# Patient Record
Sex: Female | Born: 1940 | Race: White | Hispanic: No | Smoking: Never smoker
Health system: Southern US, Community
[De-identification: ages and names within clinical notes are randomized; demographics above are authoritative.]

## PROBLEM LIST (undated history)

## (undated) DIAGNOSIS — D72829 Elevated white blood cell count, unspecified: Secondary | ICD-10-CM

## (undated) DIAGNOSIS — D751 Secondary polycythemia: Principal | ICD-10-CM

## (undated) DIAGNOSIS — C20 Malignant neoplasm of rectum: Secondary | ICD-10-CM

## (undated) DIAGNOSIS — Z923 Personal history of irradiation: Secondary | ICD-10-CM

## (undated) HISTORY — DX: Personal history of irradiation: Z92.3

## (undated) HISTORY — DX: Elevated white blood cell count, unspecified: D72.829

## (undated) HISTORY — PX: LAPAROSCOPIC SALPINGO OOPHERECTOMY: SHX5927

## (undated) HISTORY — PX: COLON RESECTION: SHX5231

## (undated) HISTORY — PX: SPLENECTOMY, TOTAL: SHX788

## (undated) HISTORY — DX: Malignant neoplasm of rectum: C20

## (undated) HISTORY — PX: CHOLECYSTECTOMY: SHX55

## (undated) HISTORY — DX: Secondary polycythemia: D75.1

---

## 2002-05-15 DIAGNOSIS — C20 Malignant neoplasm of rectum: Secondary | ICD-10-CM

## 2002-05-15 HISTORY — DX: Malignant neoplasm of rectum: C20

## 2002-08-02 LAB — HM COLONOSCOPY

## 2003-10-15 ENCOUNTER — Ambulatory Visit: Payer: Self-pay | Admitting: Internal Medicine

## 2003-11-15 ENCOUNTER — Ambulatory Visit: Payer: Self-pay | Admitting: Internal Medicine

## 2003-12-15 ENCOUNTER — Ambulatory Visit: Payer: Self-pay | Admitting: Internal Medicine

## 2004-01-15 ENCOUNTER — Ambulatory Visit: Payer: Self-pay | Admitting: Internal Medicine

## 2004-02-15 ENCOUNTER — Ambulatory Visit: Payer: Self-pay | Admitting: Internal Medicine

## 2004-03-14 ENCOUNTER — Ambulatory Visit: Payer: Self-pay | Admitting: Internal Medicine

## 2004-04-14 ENCOUNTER — Ambulatory Visit: Payer: Self-pay | Admitting: Internal Medicine

## 2004-05-14 ENCOUNTER — Ambulatory Visit: Payer: Self-pay | Admitting: Internal Medicine

## 2004-06-14 ENCOUNTER — Ambulatory Visit: Payer: Self-pay | Admitting: Internal Medicine

## 2004-06-26 ENCOUNTER — Emergency Department: Payer: Self-pay | Admitting: Emergency Medicine

## 2004-07-02 ENCOUNTER — Ambulatory Visit: Payer: Self-pay | Admitting: Family Medicine

## 2004-07-14 ENCOUNTER — Ambulatory Visit: Payer: Self-pay | Admitting: Internal Medicine

## 2004-08-14 ENCOUNTER — Ambulatory Visit: Payer: Self-pay | Admitting: Internal Medicine

## 2004-09-14 ENCOUNTER — Ambulatory Visit: Payer: Self-pay | Admitting: Internal Medicine

## 2004-10-14 ENCOUNTER — Ambulatory Visit: Payer: Self-pay | Admitting: Internal Medicine

## 2004-11-14 ENCOUNTER — Ambulatory Visit: Payer: Self-pay | Admitting: Internal Medicine

## 2004-12-14 ENCOUNTER — Ambulatory Visit: Payer: Self-pay | Admitting: Internal Medicine

## 2005-01-14 ENCOUNTER — Ambulatory Visit: Payer: Self-pay | Admitting: Internal Medicine

## 2005-02-14 ENCOUNTER — Ambulatory Visit: Payer: Self-pay | Admitting: Internal Medicine

## 2005-03-14 ENCOUNTER — Ambulatory Visit: Payer: Self-pay | Admitting: Internal Medicine

## 2005-04-14 ENCOUNTER — Ambulatory Visit: Payer: Self-pay | Admitting: Internal Medicine

## 2005-05-14 ENCOUNTER — Ambulatory Visit: Payer: Self-pay | Admitting: Internal Medicine

## 2005-06-14 ENCOUNTER — Ambulatory Visit: Payer: Self-pay | Admitting: Internal Medicine

## 2005-07-14 ENCOUNTER — Ambulatory Visit: Payer: Self-pay | Admitting: Internal Medicine

## 2005-08-14 ENCOUNTER — Ambulatory Visit: Payer: Self-pay | Admitting: Internal Medicine

## 2005-09-14 ENCOUNTER — Ambulatory Visit: Payer: Self-pay | Admitting: Internal Medicine

## 2005-10-14 ENCOUNTER — Ambulatory Visit: Payer: Self-pay | Admitting: Internal Medicine

## 2005-11-14 ENCOUNTER — Ambulatory Visit: Payer: Self-pay | Admitting: Internal Medicine

## 2005-12-14 ENCOUNTER — Ambulatory Visit: Payer: Self-pay | Admitting: Internal Medicine

## 2006-01-14 ENCOUNTER — Ambulatory Visit: Payer: Self-pay | Admitting: Internal Medicine

## 2006-02-14 ENCOUNTER — Ambulatory Visit: Payer: Self-pay | Admitting: Internal Medicine

## 2006-03-15 ENCOUNTER — Ambulatory Visit: Payer: Self-pay | Admitting: Internal Medicine

## 2006-04-15 ENCOUNTER — Ambulatory Visit: Payer: Self-pay | Admitting: Internal Medicine

## 2006-05-15 ENCOUNTER — Ambulatory Visit: Payer: Self-pay | Admitting: Internal Medicine

## 2006-06-15 ENCOUNTER — Ambulatory Visit: Payer: Self-pay | Admitting: Internal Medicine

## 2006-07-15 ENCOUNTER — Ambulatory Visit: Payer: Self-pay | Admitting: Internal Medicine

## 2006-08-15 ENCOUNTER — Ambulatory Visit: Payer: Self-pay | Admitting: Internal Medicine

## 2006-09-15 ENCOUNTER — Ambulatory Visit: Payer: Self-pay | Admitting: Internal Medicine

## 2006-10-15 ENCOUNTER — Ambulatory Visit: Payer: Self-pay | Admitting: Internal Medicine

## 2006-11-15 ENCOUNTER — Ambulatory Visit: Payer: Self-pay | Admitting: Internal Medicine

## 2006-12-15 ENCOUNTER — Ambulatory Visit: Payer: Self-pay | Admitting: Internal Medicine

## 2007-01-15 ENCOUNTER — Ambulatory Visit: Payer: Self-pay | Admitting: Internal Medicine

## 2007-02-15 ENCOUNTER — Ambulatory Visit: Payer: Self-pay | Admitting: Internal Medicine

## 2007-03-15 ENCOUNTER — Ambulatory Visit: Payer: Self-pay | Admitting: Internal Medicine

## 2007-04-15 ENCOUNTER — Ambulatory Visit: Payer: Self-pay | Admitting: Internal Medicine

## 2007-05-15 ENCOUNTER — Ambulatory Visit: Payer: Self-pay | Admitting: Internal Medicine

## 2007-06-15 ENCOUNTER — Ambulatory Visit: Payer: Self-pay | Admitting: Internal Medicine

## 2007-07-15 ENCOUNTER — Ambulatory Visit: Payer: Self-pay | Admitting: Internal Medicine

## 2007-08-15 ENCOUNTER — Ambulatory Visit: Payer: Self-pay | Admitting: Internal Medicine

## 2007-09-15 ENCOUNTER — Ambulatory Visit: Payer: Self-pay | Admitting: Internal Medicine

## 2007-10-15 ENCOUNTER — Ambulatory Visit: Payer: Self-pay | Admitting: Internal Medicine

## 2007-11-15 ENCOUNTER — Ambulatory Visit: Payer: Self-pay | Admitting: Internal Medicine

## 2007-12-15 ENCOUNTER — Ambulatory Visit: Payer: Self-pay | Admitting: Internal Medicine

## 2008-01-15 ENCOUNTER — Ambulatory Visit: Payer: Self-pay | Admitting: Internal Medicine

## 2008-02-15 ENCOUNTER — Ambulatory Visit: Payer: Self-pay | Admitting: Internal Medicine

## 2008-03-14 ENCOUNTER — Ambulatory Visit: Payer: Self-pay | Admitting: Internal Medicine

## 2008-04-14 ENCOUNTER — Ambulatory Visit: Payer: Self-pay | Admitting: Internal Medicine

## 2008-05-14 ENCOUNTER — Ambulatory Visit: Payer: Self-pay | Admitting: Internal Medicine

## 2008-06-22 ENCOUNTER — Ambulatory Visit: Payer: Self-pay | Admitting: Internal Medicine

## 2008-07-14 ENCOUNTER — Ambulatory Visit: Payer: Self-pay | Admitting: Internal Medicine

## 2008-08-14 ENCOUNTER — Ambulatory Visit: Payer: Self-pay | Admitting: Internal Medicine

## 2008-09-14 ENCOUNTER — Ambulatory Visit: Payer: Self-pay | Admitting: Internal Medicine

## 2008-10-14 ENCOUNTER — Ambulatory Visit: Payer: Self-pay | Admitting: Internal Medicine

## 2008-11-16 ENCOUNTER — Ambulatory Visit: Payer: Self-pay | Admitting: Internal Medicine

## 2008-12-14 ENCOUNTER — Ambulatory Visit: Payer: Self-pay | Admitting: Internal Medicine

## 2009-01-14 ENCOUNTER — Ambulatory Visit: Payer: Self-pay | Admitting: Internal Medicine

## 2009-02-14 ENCOUNTER — Ambulatory Visit: Payer: Self-pay | Admitting: Internal Medicine

## 2009-03-14 ENCOUNTER — Ambulatory Visit: Payer: Self-pay | Admitting: Internal Medicine

## 2009-04-14 ENCOUNTER — Ambulatory Visit: Payer: Self-pay | Admitting: Internal Medicine

## 2009-05-14 ENCOUNTER — Ambulatory Visit: Payer: Self-pay | Admitting: Internal Medicine

## 2009-06-14 ENCOUNTER — Ambulatory Visit: Payer: Self-pay | Admitting: Internal Medicine

## 2009-07-14 ENCOUNTER — Ambulatory Visit: Payer: Self-pay | Admitting: Internal Medicine

## 2009-08-14 ENCOUNTER — Ambulatory Visit: Payer: Self-pay | Admitting: Internal Medicine

## 2009-09-14 ENCOUNTER — Ambulatory Visit: Payer: Self-pay | Admitting: Internal Medicine

## 2009-10-14 ENCOUNTER — Ambulatory Visit: Payer: Self-pay | Admitting: Internal Medicine

## 2009-11-14 ENCOUNTER — Ambulatory Visit: Payer: Self-pay | Admitting: Internal Medicine

## 2009-12-14 ENCOUNTER — Ambulatory Visit: Payer: Self-pay | Admitting: Internal Medicine

## 2010-01-14 ENCOUNTER — Ambulatory Visit: Payer: Self-pay | Admitting: Internal Medicine

## 2010-02-14 ENCOUNTER — Ambulatory Visit: Payer: Self-pay | Admitting: Internal Medicine

## 2010-02-28 ENCOUNTER — Ambulatory Visit: Payer: Self-pay | Admitting: Internal Medicine

## 2010-03-15 ENCOUNTER — Ambulatory Visit: Payer: Self-pay | Admitting: Internal Medicine

## 2010-04-15 ENCOUNTER — Ambulatory Visit: Payer: Self-pay | Admitting: Internal Medicine

## 2010-05-15 ENCOUNTER — Ambulatory Visit: Payer: Self-pay | Admitting: Internal Medicine

## 2010-06-15 ENCOUNTER — Ambulatory Visit: Payer: Self-pay | Admitting: Internal Medicine

## 2010-07-15 ENCOUNTER — Ambulatory Visit: Payer: Self-pay | Admitting: Internal Medicine

## 2010-08-15 ENCOUNTER — Ambulatory Visit: Payer: Self-pay | Admitting: Internal Medicine

## 2010-08-23 LAB — CEA: CEA: 1.6 ng/mL (ref 0.0–4.7)

## 2010-09-15 ENCOUNTER — Ambulatory Visit: Payer: Self-pay | Admitting: Internal Medicine

## 2010-10-15 ENCOUNTER — Ambulatory Visit: Payer: Self-pay | Admitting: Internal Medicine

## 2010-11-15 ENCOUNTER — Ambulatory Visit: Payer: Self-pay | Admitting: Internal Medicine

## 2010-12-15 ENCOUNTER — Ambulatory Visit: Payer: Self-pay | Admitting: Internal Medicine

## 2011-01-15 ENCOUNTER — Ambulatory Visit: Payer: Self-pay | Admitting: Internal Medicine

## 2011-01-15 HISTORY — PX: DG  BONE DENSITY (ARMC HX): HXRAD1102

## 2011-02-15 ENCOUNTER — Ambulatory Visit: Payer: Self-pay | Admitting: Internal Medicine

## 2011-03-15 ENCOUNTER — Ambulatory Visit: Payer: Self-pay | Admitting: Internal Medicine

## 2011-04-10 LAB — CREATININE, SERUM
Creatinine: 1.36 mg/dL — ABNORMAL HIGH (ref 0.60–1.30)
EGFR (Non-African Amer.): 41 — ABNORMAL LOW

## 2011-04-10 LAB — CBC CANCER CENTER
Bands: 3 %
Basophil %: 1.1 %
Basophil: 3 %
Comment - H1-Com1: NORMAL
Eosinophil #: 0.1 x10 3/mm (ref 0.0–0.7)
Lymphocyte #: 2.7 x10 3/mm (ref 1.0–3.6)
Lymphocyte %: 32.6 %
MCH: 31.7 pg (ref 26.0–34.0)
MCHC: 33.4 g/dL (ref 32.0–36.0)
Monocyte %: 28.3 %
Monocytes: 25 %
Neutrophil #: 3.1 x10 3/mm (ref 1.4–6.5)
Platelet: 300 x10 3/mm (ref 150–440)
RDW: 14.8 % — ABNORMAL HIGH (ref 11.5–14.5)

## 2011-04-10 LAB — HEPATIC FUNCTION PANEL A (ARMC)
Albumin: 3.3 g/dL — ABNORMAL LOW (ref 3.4–5.0)
Alkaline Phosphatase: 83 U/L (ref 50–136)
Bilirubin, Direct: 0.2 mg/dL (ref 0.00–0.20)
Bilirubin,Total: 0.8 mg/dL (ref 0.2–1.0)
SGOT(AST): 27 U/L (ref 15–37)

## 2011-04-15 ENCOUNTER — Ambulatory Visit: Payer: Self-pay | Admitting: Internal Medicine

## 2011-04-15 LAB — CEA: CEA: 1.8 ng/mL (ref 0.0–4.7)

## 2011-05-15 ENCOUNTER — Ambulatory Visit: Payer: Self-pay | Admitting: Internal Medicine

## 2011-06-15 ENCOUNTER — Ambulatory Visit: Payer: Self-pay | Admitting: Internal Medicine

## 2011-07-15 ENCOUNTER — Ambulatory Visit: Payer: Self-pay | Admitting: Internal Medicine

## 2011-07-31 LAB — CBC CANCER CENTER
Basophil %: 0.8 %
Eosinophil %: 3.5 %
HCT: 43.6 % (ref 35.0–47.0)
HGB: 14.4 g/dL (ref 12.0–16.0)
Lymphocyte #: 3.9 x10 3/mm — ABNORMAL HIGH (ref 1.0–3.6)
Lymphocyte %: 47.7 %
MCH: 30.3 pg (ref 26.0–34.0)
MCV: 92 fL (ref 80–100)
Monocyte #: 1.4 x10 3/mm — ABNORMAL HIGH (ref 0.2–0.9)
Monocyte %: 16.6 %
Neutrophil #: 2.6 x10 3/mm (ref 1.4–6.5)
Platelet: 301 x10 3/mm (ref 150–440)
RBC: 4.75 10*6/uL (ref 3.80–5.20)

## 2011-08-15 ENCOUNTER — Ambulatory Visit: Payer: Self-pay | Admitting: Internal Medicine

## 2011-09-15 ENCOUNTER — Ambulatory Visit: Payer: Self-pay | Admitting: Internal Medicine

## 2011-10-15 ENCOUNTER — Ambulatory Visit: Payer: Self-pay | Admitting: Internal Medicine

## 2011-11-15 ENCOUNTER — Ambulatory Visit: Payer: Self-pay | Admitting: Internal Medicine

## 2011-11-20 LAB — CBC CANCER CENTER
Basophil %: 1.3 %
Eosinophil %: 2.4 %
HCT: 45.5 % (ref 35.0–47.0)
HGB: 15.1 g/dL (ref 12.0–16.0)
Lymphocyte #: 5 x10 3/mm — ABNORMAL HIGH (ref 1.0–3.6)
MCH: 30.7 pg (ref 26.0–34.0)
MCV: 92 fL (ref 80–100)
Monocyte #: 1.7 x10 3/mm — ABNORMAL HIGH (ref 0.2–0.9)
Neutrophil #: 3.7 x10 3/mm (ref 1.4–6.5)
RBC: 4.93 10*6/uL (ref 3.80–5.20)

## 2011-11-20 LAB — HEPATIC FUNCTION PANEL A (ARMC)
Alkaline Phosphatase: 74 U/L (ref 50–136)
Bilirubin,Total: 0.8 mg/dL (ref 0.2–1.0)
SGOT(AST): 21 U/L (ref 15–37)
Total Protein: 7.4 g/dL (ref 6.4–8.2)

## 2011-12-15 ENCOUNTER — Ambulatory Visit: Payer: Self-pay | Admitting: Internal Medicine

## 2012-01-15 ENCOUNTER — Ambulatory Visit: Payer: Self-pay | Admitting: Internal Medicine

## 2012-02-15 ENCOUNTER — Ambulatory Visit: Payer: Self-pay | Admitting: Internal Medicine

## 2012-02-19 LAB — CBC CANCER CENTER
Eosinophil %: 2.7 %
MCHC: 33.9 g/dL (ref 32.0–36.0)
MCV: 91 fL (ref 80–100)
Monocyte %: 13.4 %
Neutrophil #: 3.8 x10 3/mm (ref 1.4–6.5)
RBC: 4.64 10*6/uL (ref 3.80–5.20)
RDW: 14.6 % — ABNORMAL HIGH (ref 11.5–14.5)
WBC: 10.7 x10 3/mm (ref 3.6–11.0)

## 2012-03-14 ENCOUNTER — Ambulatory Visit: Payer: Self-pay | Admitting: Internal Medicine

## 2012-04-14 ENCOUNTER — Ambulatory Visit: Payer: Self-pay | Admitting: Internal Medicine

## 2012-05-13 LAB — CBC CANCER CENTER
Eosinophil #: 0.3 x10 3/mm (ref 0.0–0.7)
HCT: 44.2 % (ref 35.0–47.0)
Lymphocyte #: 4.7 x10 3/mm — ABNORMAL HIGH (ref 1.0–3.6)
Lymphocyte %: 43.8 %
MCH: 30.4 pg (ref 26.0–34.0)
Monocyte %: 15.7 %
Neutrophil %: 36.1 %
Platelet: 278 x10 3/mm (ref 150–440)
WBC: 10.8 x10 3/mm (ref 3.6–11.0)

## 2012-05-13 LAB — CREATININE, SERUM
Creatinine: 1.3 mg/dL (ref 0.60–1.30)
EGFR (Non-African Amer.): 41 — ABNORMAL LOW

## 2012-05-13 LAB — HEPATIC FUNCTION PANEL A (ARMC)
Albumin: 3.2 g/dL — ABNORMAL LOW (ref 3.4–5.0)
Bilirubin, Direct: 0.1 mg/dL (ref 0.00–0.20)
SGOT(AST): 18 U/L (ref 15–37)
SGPT (ALT): 24 U/L (ref 12–78)

## 2012-05-14 ENCOUNTER — Ambulatory Visit: Payer: Self-pay | Admitting: Internal Medicine

## 2012-06-14 ENCOUNTER — Ambulatory Visit: Payer: Self-pay | Admitting: Internal Medicine

## 2012-07-14 ENCOUNTER — Ambulatory Visit: Payer: Self-pay | Admitting: Internal Medicine

## 2012-08-05 LAB — CBC CANCER CENTER
Basophil #: 0.1 x10 3/mm (ref 0.0–0.1)
Basophil %: 0.5 %
Eosinophil #: 0.3 x10 3/mm (ref 0.0–0.7)
Eosinophil %: 3 %
Lymphocyte #: 4.9 x10 3/mm — ABNORMAL HIGH (ref 1.0–3.6)
MCH: 31.7 pg (ref 26.0–34.0)
MCV: 90 fL (ref 80–100)
Monocyte %: 15.2 %
Neutrophil #: 3.3 x10 3/mm (ref 1.4–6.5)
Neutrophil %: 32.7 %
Platelet: 282 x10 3/mm (ref 150–440)
RBC: 4.64 10*6/uL (ref 3.80–5.20)
RDW: 14.3 % (ref 11.5–14.5)
WBC: 10.2 x10 3/mm (ref 3.6–11.0)

## 2012-08-14 ENCOUNTER — Ambulatory Visit: Payer: Self-pay | Admitting: Internal Medicine

## 2012-09-14 ENCOUNTER — Ambulatory Visit: Payer: Self-pay | Admitting: Internal Medicine

## 2012-10-14 ENCOUNTER — Ambulatory Visit: Payer: Self-pay | Admitting: Internal Medicine

## 2012-11-14 ENCOUNTER — Ambulatory Visit: Payer: Self-pay | Admitting: Internal Medicine

## 2012-11-25 LAB — CBC CANCER CENTER
Basophil #: 0.1 x10 3/mm (ref 0.0–0.1)
Basophil %: 1.1 %
Eosinophil %: 2.8 %
MCH: 31.2 pg (ref 26.0–34.0)
MCHC: 33.4 g/dL (ref 32.0–36.0)
MCV: 94 fL (ref 80–100)
Monocyte #: 1.7 x10 3/mm — ABNORMAL HIGH (ref 0.2–0.9)
Neutrophil #: 3.9 x10 3/mm (ref 1.4–6.5)
Neutrophil %: 35 %
Platelet: 274 x10 3/mm (ref 150–440)
RBC: 4.63 10*6/uL (ref 3.80–5.20)
RDW: 14.4 % (ref 11.5–14.5)

## 2012-12-14 ENCOUNTER — Ambulatory Visit: Payer: Self-pay | Admitting: Internal Medicine

## 2013-01-14 ENCOUNTER — Ambulatory Visit: Payer: Self-pay | Admitting: Internal Medicine

## 2013-01-14 ENCOUNTER — Ambulatory Visit: Payer: Self-pay | Admitting: Family Medicine

## 2013-02-14 ENCOUNTER — Ambulatory Visit: Payer: Self-pay | Admitting: Internal Medicine

## 2013-03-14 ENCOUNTER — Ambulatory Visit: Payer: Self-pay | Admitting: Internal Medicine

## 2013-03-17 LAB — CBC CANCER CENTER
BASOS ABS: 0.1 x10 3/mm (ref 0.0–0.1)
Basophil %: 0.8 %
EOS ABS: 0.2 x10 3/mm (ref 0.0–0.7)
Eosinophil %: 2.4 %
HCT: 46.9 % (ref 35.0–47.0)
HGB: 15.5 g/dL (ref 12.0–16.0)
LYMPHS ABS: 3.4 x10 3/mm (ref 1.0–3.6)
LYMPHS PCT: 36 %
MCH: 30.8 pg (ref 26.0–34.0)
MCHC: 33.1 g/dL (ref 32.0–36.0)
MCV: 93 fL (ref 80–100)
Monocyte #: 1.2 x10 3/mm — ABNORMAL HIGH (ref 0.2–0.9)
Monocyte %: 12.4 %
Neutrophil #: 4.6 x10 3/mm (ref 1.4–6.5)
Neutrophil %: 48.4 %
PLATELETS: 280 x10 3/mm (ref 150–440)
RBC: 5.04 10*6/uL (ref 3.80–5.20)
RDW: 13.6 % (ref 11.5–14.5)
WBC: 9.5 x10 3/mm (ref 3.6–11.0)

## 2013-03-17 LAB — HEPATIC FUNCTION PANEL A (ARMC)
ALBUMIN: 2.9 g/dL — AB (ref 3.4–5.0)
AST: 21 U/L (ref 15–37)
Alkaline Phosphatase: 67 U/L
Bilirubin, Direct: 0.1 mg/dL (ref 0.00–0.20)
Bilirubin,Total: 0.8 mg/dL (ref 0.2–1.0)
SGPT (ALT): 27 U/L (ref 12–78)
TOTAL PROTEIN: 6.1 g/dL — AB (ref 6.4–8.2)

## 2013-03-17 LAB — BASIC METABOLIC PANEL
ANION GAP: 8 (ref 7–16)
BUN: 19 mg/dL — ABNORMAL HIGH (ref 7–18)
CALCIUM: 7.4 mg/dL — AB (ref 8.5–10.1)
CREATININE: 1.15 mg/dL (ref 0.60–1.30)
Chloride: 106 mmol/L (ref 98–107)
Co2: 25 mmol/L (ref 21–32)
EGFR (African American): 55 — ABNORMAL LOW
GFR CALC NON AF AMER: 48 — AB
GLUCOSE: 125 mg/dL — AB (ref 65–99)
Osmolality: 281 (ref 275–301)
POTASSIUM: 3.1 mmol/L — AB (ref 3.5–5.1)
Sodium: 139 mmol/L (ref 136–145)

## 2013-03-17 LAB — LIPASE, BLOOD: Lipase: 139 U/L (ref 73–393)

## 2013-03-18 LAB — CEA: CEA: 1.7 ng/mL (ref 0.0–4.7)

## 2013-03-23 DIAGNOSIS — K439 Ventral hernia without obstruction or gangrene: Secondary | ICD-10-CM | POA: Insufficient documentation

## 2013-03-23 LAB — POTASSIUM: Potassium: 4.1 mmol/L (ref 3.5–5.1)

## 2013-04-14 ENCOUNTER — Ambulatory Visit: Payer: Self-pay | Admitting: Internal Medicine

## 2013-05-14 ENCOUNTER — Ambulatory Visit: Payer: Self-pay | Admitting: Internal Medicine

## 2013-06-14 ENCOUNTER — Ambulatory Visit: Payer: Self-pay | Admitting: Internal Medicine

## 2013-07-07 LAB — CBC CANCER CENTER
BASOS ABS: 0.2 x10 3/mm — AB (ref 0.0–0.1)
BASOS PCT: 2.1 %
EOS PCT: 2.2 %
Eosinophil #: 0.2 x10 3/mm (ref 0.0–0.7)
HCT: 42 % (ref 35.0–47.0)
HGB: 14.5 g/dL (ref 12.0–16.0)
LYMPHS PCT: 46.9 %
Lymphocyte #: 5.2 x10 3/mm — ABNORMAL HIGH (ref 1.0–3.6)
MCH: 31.6 pg (ref 26.0–34.0)
MCHC: 34.6 g/dL (ref 32.0–36.0)
MCV: 91 fL (ref 80–100)
MONOS PCT: 15.3 %
Monocyte #: 1.7 x10 3/mm — ABNORMAL HIGH (ref 0.2–0.9)
NEUTROS ABS: 3.7 x10 3/mm (ref 1.4–6.5)
Neutrophil %: 33.5 %
PLATELETS: 269 x10 3/mm (ref 150–440)
RBC: 4.6 10*6/uL (ref 3.80–5.20)
RDW: 13.8 % (ref 11.5–14.5)
WBC: 11 x10 3/mm (ref 3.6–11.0)

## 2013-07-14 ENCOUNTER — Ambulatory Visit: Payer: Self-pay | Admitting: Internal Medicine

## 2013-08-14 ENCOUNTER — Ambulatory Visit: Payer: Self-pay | Admitting: Internal Medicine

## 2013-09-14 ENCOUNTER — Ambulatory Visit: Payer: Self-pay | Admitting: Internal Medicine

## 2013-10-14 ENCOUNTER — Ambulatory Visit: Payer: Self-pay | Admitting: Internal Medicine

## 2013-10-27 LAB — CBC CANCER CENTER
BASOS ABS: 0.1 x10 3/mm (ref 0.0–0.1)
Basophil %: 1.1 %
Eosinophil #: 0.3 x10 3/mm (ref 0.0–0.7)
Eosinophil %: 3.6 %
HCT: 43.9 % (ref 35.0–47.0)
HGB: 14.8 g/dL (ref 12.0–16.0)
LYMPHS ABS: 4.3 x10 3/mm — AB (ref 1.0–3.6)
Lymphocyte %: 45.8 %
MCH: 31.5 pg (ref 26.0–34.0)
MCHC: 33.7 g/dL (ref 32.0–36.0)
MCV: 94 fL (ref 80–100)
Monocyte #: 1.4 x10 3/mm — ABNORMAL HIGH (ref 0.2–0.9)
Monocyte %: 15 %
NEUTROS PCT: 34.5 %
Neutrophil #: 3.2 x10 3/mm (ref 1.4–6.5)
Platelet: 271 x10 3/mm (ref 150–440)
RBC: 4.69 10*6/uL (ref 3.80–5.20)
RDW: 14.2 % (ref 11.5–14.5)
WBC: 9.4 x10 3/mm (ref 3.6–11.0)

## 2013-11-14 ENCOUNTER — Ambulatory Visit: Payer: Self-pay | Admitting: Internal Medicine

## 2013-12-14 ENCOUNTER — Ambulatory Visit: Payer: Self-pay | Admitting: Internal Medicine

## 2014-01-14 ENCOUNTER — Ambulatory Visit: Payer: Self-pay | Admitting: Internal Medicine

## 2014-01-19 DIAGNOSIS — Z452 Encounter for adjustment and management of vascular access device: Secondary | ICD-10-CM | POA: Diagnosis not present

## 2014-01-19 DIAGNOSIS — D751 Secondary polycythemia: Secondary | ICD-10-CM | POA: Diagnosis not present

## 2014-02-14 ENCOUNTER — Ambulatory Visit: Payer: Self-pay | Admitting: Internal Medicine

## 2014-02-16 DIAGNOSIS — I10 Essential (primary) hypertension: Secondary | ICD-10-CM | POA: Diagnosis not present

## 2014-02-16 DIAGNOSIS — F419 Anxiety disorder, unspecified: Secondary | ICD-10-CM | POA: Diagnosis not present

## 2014-02-16 DIAGNOSIS — Z85048 Personal history of other malignant neoplasm of rectum, rectosigmoid junction, and anus: Secondary | ICD-10-CM | POA: Diagnosis not present

## 2014-02-16 DIAGNOSIS — D72829 Elevated white blood cell count, unspecified: Secondary | ICD-10-CM | POA: Diagnosis not present

## 2014-02-16 DIAGNOSIS — Z79899 Other long term (current) drug therapy: Secondary | ICD-10-CM | POA: Diagnosis not present

## 2014-02-16 DIAGNOSIS — D72820 Lymphocytosis (symptomatic): Secondary | ICD-10-CM | POA: Diagnosis not present

## 2014-02-16 LAB — HEPATIC FUNCTION PANEL A (ARMC)
Albumin: 3 g/dL — ABNORMAL LOW
Alkaline Phosphatase: 74 U/L
Bilirubin, Direct: 0.1 mg/dL
Bilirubin,Total: 0.9 mg/dL
SGOT(AST): 16 U/L
SGPT (ALT): 21 U/L
Total Protein: 7.2 g/dL

## 2014-02-16 LAB — CBC CANCER CENTER
Basophil #: 0.1 "x10 3/mm "
Basophil %: 1.4 %
Eosinophil #: 0.2 "x10 3/mm "
Eosinophil %: 2.1 %
HCT: 44.1 %
HGB: 15 g/dL
Lymphocyte %: 43.7 %
Lymphs Abs: 4.5 "x10 3/mm " — ABNORMAL HIGH
MCH: 31.3 pg
MCHC: 34.1 g/dL
MCV: 92 fL
Monocyte #: 1.5 "x10 3/mm " — ABNORMAL HIGH
Monocyte %: 14.5 %
Neutrophil #: 3.9 "x10 3/mm "
Neutrophil %: 38.3 %
Platelet: 293 "x10 3/mm "
RBC: 4.8 "x10 6/mm "
RDW: 13.6 %
WBC: 10.2 "x10 3/mm "

## 2014-02-16 LAB — CREATININE, SERUM
Creatinine: 1.18 mg/dL (ref 0.60–1.30)
EGFR (Non-African Amer.): 48 — ABNORMAL LOW
GFR CALC AF AMER: 58 — AB

## 2014-02-17 LAB — CEA: CEA: 1.4 ng/mL (ref 0.0–4.7)

## 2014-03-15 ENCOUNTER — Ambulatory Visit
Admit: 2014-03-15 | Disposition: A | Discharge status: Home / Self Care | Payer: Self-pay | Attending: Internal Medicine | Admitting: Internal Medicine

## 2014-03-16 DIAGNOSIS — Z452 Encounter for adjustment and management of vascular access device: Secondary | ICD-10-CM | POA: Diagnosis not present

## 2014-03-16 DIAGNOSIS — Z79899 Other long term (current) drug therapy: Secondary | ICD-10-CM | POA: Diagnosis not present

## 2014-03-16 DIAGNOSIS — I10 Essential (primary) hypertension: Secondary | ICD-10-CM | POA: Diagnosis not present

## 2014-03-16 DIAGNOSIS — Z85048 Personal history of other malignant neoplasm of rectum, rectosigmoid junction, and anus: Secondary | ICD-10-CM | POA: Diagnosis not present

## 2014-03-16 DIAGNOSIS — D72829 Elevated white blood cell count, unspecified: Secondary | ICD-10-CM | POA: Diagnosis not present

## 2014-03-16 DIAGNOSIS — D72820 Lymphocytosis (symptomatic): Secondary | ICD-10-CM | POA: Diagnosis not present

## 2014-03-16 DIAGNOSIS — F419 Anxiety disorder, unspecified: Secondary | ICD-10-CM | POA: Diagnosis not present

## 2014-04-13 DIAGNOSIS — Z79899 Other long term (current) drug therapy: Secondary | ICD-10-CM | POA: Diagnosis not present

## 2014-04-13 DIAGNOSIS — Z85048 Personal history of other malignant neoplasm of rectum, rectosigmoid junction, and anus: Secondary | ICD-10-CM | POA: Diagnosis not present

## 2014-04-13 DIAGNOSIS — D72820 Lymphocytosis (symptomatic): Secondary | ICD-10-CM | POA: Diagnosis not present

## 2014-04-13 DIAGNOSIS — F419 Anxiety disorder, unspecified: Secondary | ICD-10-CM | POA: Diagnosis not present

## 2014-04-13 DIAGNOSIS — Z452 Encounter for adjustment and management of vascular access device: Secondary | ICD-10-CM | POA: Diagnosis not present

## 2014-04-13 DIAGNOSIS — D72829 Elevated white blood cell count, unspecified: Secondary | ICD-10-CM | POA: Diagnosis not present

## 2014-04-13 DIAGNOSIS — I10 Essential (primary) hypertension: Secondary | ICD-10-CM | POA: Diagnosis not present

## 2014-04-15 ENCOUNTER — Ambulatory Visit
Admit: 2014-04-15 | Disposition: A | Discharge status: Home / Self Care | Payer: Self-pay | Attending: Internal Medicine | Admitting: Internal Medicine

## 2014-05-11 DIAGNOSIS — Z452 Encounter for adjustment and management of vascular access device: Secondary | ICD-10-CM | POA: Diagnosis not present

## 2014-05-11 DIAGNOSIS — D72820 Lymphocytosis (symptomatic): Secondary | ICD-10-CM | POA: Diagnosis not present

## 2014-05-11 DIAGNOSIS — D72829 Elevated white blood cell count, unspecified: Secondary | ICD-10-CM | POA: Diagnosis not present

## 2014-06-05 ENCOUNTER — Encounter: Payer: Self-pay | Admitting: *Deleted

## 2014-06-05 ENCOUNTER — Other Ambulatory Visit: Payer: Self-pay | Admitting: *Deleted

## 2014-06-05 DIAGNOSIS — D72829 Elevated white blood cell count, unspecified: Secondary | ICD-10-CM | POA: Insufficient documentation

## 2014-06-05 DIAGNOSIS — Z95828 Presence of other vascular implants and grafts: Secondary | ICD-10-CM

## 2014-06-05 DIAGNOSIS — C189 Malignant neoplasm of colon, unspecified: Secondary | ICD-10-CM

## 2014-06-05 DIAGNOSIS — D751 Secondary polycythemia: Secondary | ICD-10-CM

## 2014-06-05 HISTORY — DX: Elevated white blood cell count, unspecified: D72.829

## 2014-06-05 HISTORY — DX: Secondary polycythemia: D75.1

## 2014-06-05 MED ORDER — HEPARIN SOD (PORK) LOCK FLUSH 100 UNIT/ML IV SOLN
500.0000 [IU] | INTRAVENOUS | Status: AC
Start: 2014-06-08 — End: 2015-08-01

## 2014-06-05 MED ORDER — SODIUM CHLORIDE 0.9 % IJ SOLN
10.0000 mL | INTRAMUSCULAR | Status: AC
  Filled 2014-06-05: qty 10

## 2014-06-08 ENCOUNTER — Inpatient Hospital Stay: Payer: Medicare Other

## 2014-06-08 ENCOUNTER — Inpatient Hospital Stay: Payer: Medicare Other | Attending: Internal Medicine

## 2014-06-08 DIAGNOSIS — C801 Malignant (primary) neoplasm, unspecified: Secondary | ICD-10-CM

## 2014-06-08 DIAGNOSIS — Z85048 Personal history of other malignant neoplasm of rectum, rectosigmoid junction, and anus: Secondary | ICD-10-CM | POA: Insufficient documentation

## 2014-06-08 DIAGNOSIS — Z79899 Other long term (current) drug therapy: Secondary | ICD-10-CM | POA: Diagnosis not present

## 2014-06-08 DIAGNOSIS — D72829 Elevated white blood cell count, unspecified: Secondary | ICD-10-CM | POA: Diagnosis not present

## 2014-06-08 DIAGNOSIS — C189 Malignant neoplasm of colon, unspecified: Secondary | ICD-10-CM

## 2014-06-08 DIAGNOSIS — D72820 Lymphocytosis (symptomatic): Secondary | ICD-10-CM | POA: Diagnosis not present

## 2014-06-08 DIAGNOSIS — D751 Secondary polycythemia: Secondary | ICD-10-CM

## 2014-06-08 LAB — HEPATIC FUNCTION PANEL
ALT: 22 U/L (ref 14–54)
AST: 28 U/L (ref 15–41)
Albumin: 3.5 g/dL (ref 3.5–5.0)
Alkaline Phosphatase: 55 U/L (ref 38–126)
Bilirubin, Direct: 0.2 mg/dL (ref 0.1–0.5)
Indirect Bilirubin: 1 mg/dL — ABNORMAL HIGH (ref 0.3–0.9)
Total Bilirubin: 1.2 mg/dL (ref 0.3–1.2)
Total Protein: 7.1 g/dL (ref 6.5–8.1)

## 2014-06-08 LAB — CBC WITH DIFFERENTIAL/PLATELET
BASOS PCT: 1 %
Basophils Absolute: 0.1 10*3/uL (ref 0–0.1)
EOS ABS: 0.3 10*3/uL (ref 0–0.7)
EOS PCT: 3 %
HEMATOCRIT: 43.4 % (ref 35.0–47.0)
Hemoglobin: 14.8 g/dL (ref 12.0–16.0)
LYMPHS ABS: 4.4 10*3/uL — AB (ref 1.0–3.6)
Lymphocytes Relative: 41 %
MCH: 31.6 pg (ref 26.0–34.0)
MCHC: 34 g/dL (ref 32.0–36.0)
MCV: 92.8 fL (ref 80.0–100.0)
MONO ABS: 1.6 10*3/uL — AB (ref 0.2–0.9)
MONOS PCT: 15 %
Neutro Abs: 4.4 10*3/uL (ref 1.4–6.5)
Neutrophils Relative %: 40 %
Platelets: 257 10*3/uL (ref 150–440)
RBC: 4.67 MIL/uL (ref 3.80–5.20)
RDW: 14.3 % (ref 11.5–14.5)
WBC: 10.9 10*3/uL (ref 3.6–11.0)

## 2014-06-08 LAB — CREATININE, SERUM
Creatinine, Ser: 1.11 mg/dL — ABNORMAL HIGH (ref 0.44–1.00)
GFR calc non Af Amer: 48 mL/min — ABNORMAL LOW (ref >60)
GFR, EST AFRICAN AMERICAN: 56 mL/min — AB (ref >60)

## 2014-06-08 MED ORDER — SODIUM CHLORIDE 0.9 % IJ SOLN
10.0000 mL | INTRAMUSCULAR | Status: DC | PRN
Start: 2014-06-08 — End: 2017-03-03
  Administered 2014-06-08: 10 mL via INTRAVENOUS
  Filled 2014-06-08: qty 10

## 2014-06-08 MED ORDER — HEPARIN SOD (PORK) LOCK FLUSH 100 UNIT/ML IV SOLN
500.0000 [IU] | Freq: Once | INTRAVENOUS | Status: AC
  Administered 2014-06-08: 500 [IU] via INTRAVENOUS

## 2014-06-08 MED ORDER — HEPARIN SOD (PORK) LOCK FLUSH 100 UNIT/ML IV SOLN
INTRAVENOUS | Status: AC
  Filled 2014-06-08: qty 5

## 2014-06-09 LAB — CEA: CEA: 1.6 ng/mL (ref 0.0–4.7)

## 2014-06-28 ENCOUNTER — Telehealth: Payer: Self-pay | Admitting: *Deleted

## 2014-06-28 NOTE — Telephone Encounter (Signed)
rcvd fax, finnegan on call for pandit and he approved refill of coumadin 1mg  and it was called into her pharmacy.

## 2014-07-04 ENCOUNTER — Encounter: Payer: Self-pay | Admitting: Family Medicine

## 2014-07-04 ENCOUNTER — Ambulatory Visit (INDEPENDENT_AMBULATORY_CARE_PROVIDER_SITE_OTHER): Payer: Medicare Other | Admitting: Family Medicine

## 2014-07-04 VITALS — BP 134/82 | HR 64 | Temp 98.2°F | Resp 16 | Ht 60.0 in | Wt 200.0 lb

## 2014-07-04 DIAGNOSIS — E7800 Pure hypercholesterolemia, unspecified: Secondary | ICD-10-CM

## 2014-07-04 DIAGNOSIS — M8580 Other specified disorders of bone density and structure, unspecified site: Secondary | ICD-10-CM | POA: Diagnosis not present

## 2014-07-04 DIAGNOSIS — F419 Anxiety disorder, unspecified: Secondary | ICD-10-CM | POA: Insufficient documentation

## 2014-07-04 DIAGNOSIS — C9110 Chronic lymphocytic leukemia of B-cell type not having achieved remission: Secondary | ICD-10-CM | POA: Insufficient documentation

## 2014-07-04 DIAGNOSIS — I10 Essential (primary) hypertension: Secondary | ICD-10-CM

## 2014-07-04 DIAGNOSIS — E559 Vitamin D deficiency, unspecified: Secondary | ICD-10-CM | POA: Diagnosis not present

## 2014-07-04 DIAGNOSIS — E119 Type 2 diabetes mellitus without complications: Secondary | ICD-10-CM | POA: Insufficient documentation

## 2014-07-04 DIAGNOSIS — K5792 Diverticulitis of intestine, part unspecified, without perforation or abscess without bleeding: Secondary | ICD-10-CM | POA: Insufficient documentation

## 2014-07-04 DIAGNOSIS — E669 Obesity, unspecified: Secondary | ICD-10-CM | POA: Insufficient documentation

## 2014-07-04 DIAGNOSIS — Z0000 Encounter for general adult medical examination without abnormal findings: Secondary | ICD-10-CM

## 2014-07-04 DIAGNOSIS — Z85038 Personal history of other malignant neoplasm of large intestine: Secondary | ICD-10-CM | POA: Insufficient documentation

## 2014-07-04 MED ORDER — LISINOPRIL-HYDROCHLOROTHIAZIDE 10-12.5 MG PO TABS: 1.0000 | ORAL_TABLET | Freq: Every day | ORAL | Status: DC

## 2014-07-04 NOTE — Progress Notes (Signed)
Patient ID: Danielle Rowland, female   DOB: 1940/01/17, 74 y.o.   MRN: [id number]  Patient: Danielle Rowland, Female    DOB: 10/05/1940, 74 y.o.   MRN: [id number] Visit Date: 07/04/2014  Today's Provider: Lelon Huh, MD   Chief Complaint  Patient presents with  . Annual Exam   Subjective:    Annual wellness visit Danielle Rowland is a 74 y.o. female who presents today for her Subsequent Annual Wellness Visit. She feels fairly well.  Pt reports having Fevers, Chills, Diarrhea and Vomiting last week.  She reports feels some better now, but still not completely better.  Still having some abdominal cramps and feeling "run down".   She reports exercises regularly, walks around her apartment complex when she feels well. She reports she is sleeping well.  -----------------------------------------------------------   Diabetes Mellitus Type II, Follow-up:   She was seen 6 weeks ago with Hgba1c of 10.8. She has since doubled the dose of metormin and has been doing better with diet. She is not checking her sugars regularly.   Episodes of hypoglycemia? no    ------------------------------------------------------------------------   Hypertension, follow-up:  BP Readings from Last 3 Encounters:  07/04/14 134/82    She was last seen for hypertension 6 weeks ago.  BP at that visit was 128/72.  She is not having side effects.  She is not exercising. She is adherent to low salt diet.    She is experiencing none.  Patient denies chest pain, fatigue, irregular heart beat, near-syncope, palpitations, paroxysmal nocturnal dyspnea, syncope and tachypnea.   Cardiovascular risk factors include diabetes mellitus, dyslipidemia and hypertension.  Use of agents associated with hypertension: none.   ------------------------------------------------------------------------     Review of Systems  Constitutional: Negative.   HENT: Negative.   Eyes: Negative.   Respiratory: Negative.    Cardiovascular: Negative.   Gastrointestinal: Negative.   Endocrine: Negative.   Genitourinary: Negative.   Musculoskeletal: Negative.   Skin: Negative.   Allergic/Immunologic: Negative.   Neurological: Negative.   Hematological: Negative.   Psychiatric/Behavioral: Negative.     History   Social History  . Marital Status: Divorced    Spouse Name: N/A  . Number of Children: 1  . Years of Education: H/S   Occupational History  . Florist     Part-time   Social History Main Topics  . Smoking status: Never Smoker   . Smokeless tobacco: Never Used  . Alcohol Use: No  . Drug Use: No  . Sexual Activity: Not on file   Other Topics Concern  . Not on file   Social History Narrative    Patient Active Problem List   Diagnosis Date Noted  . Hypertension 07/04/2014  . Hypercholesteremia 07/04/2014  . Osteopenia 07/04/2014  . Vitamin D deficiency 07/04/2014  . Anxiety 07/04/2014  . Chronic lymphatic leukemia 07/04/2014  . Diabetes 07/04/2014  . Diverticulitis 07/04/2014  . H/O malignant neoplasm of colon 07/04/2014  . Adiposity 07/04/2014  . Colon cancer 06/05/2014  . Hernia of anterior abdominal wall 03/23/2013    Past Surgical History  Procedure Laterality Date  . Cholecystectomy    . Colon resection      Anterior  . Laparoscopic salpingo oopherectomy Left     Her family history includes CAD in her mother and sister; Diabetes in her sister and sister; Hypertension in her sister and sister; Hypothyroidism in her sister and sister; Lung cancer in her father; Prostate cancer in her father; Stroke in her mother; Transient ischemic attack  in her sister.    Previous Medications   ALBUTEROL (PROAIR HFA) 108 (90 BASE) MCG/ACT INHALER    Inhale 2 puffs into the lungs as needed.   ALPRAZOLAM (XANAX) 0.25 MG TABLET       CHOLECALCIFEROL (VITAMIN D) 1000 UNITS TABLET    Take 1,000 Units by mouth daily.   LISINOPRIL-HYDROCHLOROTHIAZIDE (PRINZIDE,ZESTORETIC) 10-12.5 MG PER  TABLET    Take 1 tablet by mouth daily.   WARFARIN (COUMADIN) 1 MG TABLET    Take 1 mg by mouth daily.    Patient Care Team: Birdie Sons, MD as PCP - General (Family Medicine) Leia Alf, MD as Attending Physician (Internal Medicine)     Objective:   Vitals: BP 134/82 mmHg  Pulse 64  Temp(Src) 98.2 F (36.8 C) (Oral)  Resp 16  Ht 5' (1.524 m)  Wt 200 lb (90.719 kg)  BMI 39.06 kg/m2  Physical Exam   General Appearance:    Alert, cooperative, no distress, obese  Eyes:    PERRL, conjunctiva/corneas clear, EOM's intact       Lungs:     Clear to auscultation bilaterally, respirations unlabored  Heart:    Regular rate and rhythm  Neurologic:   Awake, alert, oriented x 3. No apparent focal neurological           defect.         Activities of Daily Living In your present state of health, do you have any difficulty performing the following activities: 07/04/2014  Hearing? N  Vision? N  Difficulty concentrating or making decisions? N  Walking or climbing stairs? N  Dressing or bathing? N  Doing errands, shopping? N    Fall Risk Assessment Fall Risk  07/04/2014  Falls in the past year? No     Depression Screen PHQ 2/9 Scores 07/04/2014  PHQ - 2 Score 0    Cognitive Testing - 6-CIT  Correct? Score   What year is it? yes 0 0 or 4  What month is it? yes 0 0 or 3  Memorize:    Pia Mau,  43,  [address],  [place],      What time is it? (within 1 hour) yes 0 0 or 3  Count backwards from 20 yes 0 0, 2, or 4  Name the months of the year yes 0 0, 2, or 4  Repeat name & address above yes 0 0, 2, 4, 6, 8, or 10       TOTAL SCORE  0/28   Interpretation:  Normal  Normal (0-7) Abnormal (8-28)    EKG Interpretation: Sinus  Bradycardia  WITHIN NORMAL LIMITS      Assessment & Plan:     Annual Wellness Visit  Reviewed patient's Family Medical History Reviewed and updated list of patient's medical providers Assessment of cognitive impairment was  done Assessed patient's functional ability Established a written schedule for health screening [place] Completed and Reviewed  Exercise Activities and Dietary recommendations Goals    None       There is no immunization history on file for this patient.  Health Maintenance  Topic Date Due  . HEMOGLOBIN A1C  1940/06/24  . FOOT EXAM  05/07/1951  . OPHTHALMOLOGY EXAM  05/07/1951  . URINE MICROALBUMIN  05/07/1951  . TETANUS/TDAP  05/06/1960  . MAMMOGRAM  05/07/1991  . COLONOSCOPY  05/07/1991  . ZOSTAVAX  05/06/2001  . DEXA SCAN  05/07/2006  . PNA vac Low Risk Adult (1 of  2 - PCV13) 05/07/2006  . INFLUENZA VACCINE  08/15/2014      Discussed health benefits of physical activity, and encouraged her to engage in regular exercise appropriate for her age and condition.    ------------------------------------------------------------------------------------------------------------ 1. Annual physical exam Generally dong well.   2. Essential hypertension well controlled  - lisinopril-hydrochlorothiazide (PRINZIDE,ZESTORETIC) 10-12.5 MG per tablet; Take 1 tablet by mouth daily.  Dispense: 90 tablet; Refill: 3 - EKG 12-Lead - Renal function panel  3. Hypercholesteremia  - Lipid panel - TSH  4. Osteopenia  - DG Bone Density; Future  5. Vitamin D deficiency  - Vit D  25 hydroxy (rtn osteoporosis monitoring)  6. Type 2 diabetes mellitus without complication Doing better with diet and tolerating current medications.  - Hemoglobin A1c

## 2014-07-05 LAB — RENAL FUNCTION PANEL
ALBUMIN: 3.6 g/dL (ref 3.5–4.8)
BUN/Creatinine Ratio: 18 (ref 11–26)
BUN: 20 mg/dL (ref 8–27)
CO2: 27 mmol/L (ref 18–29)
CREATININE: 1.11 mg/dL — AB (ref 0.57–1.00)
Calcium: 9.1 mg/dL (ref 8.7–10.3)
Chloride: 97 mmol/L (ref 97–108)
GFR calc Af Amer: 57 mL/min/{1.73_m2} — ABNORMAL LOW (ref >59)
GFR, EST NON AFRICAN AMERICAN: 49 mL/min/{1.73_m2} — AB (ref >59)
GLUCOSE: 161 mg/dL — AB (ref 65–99)
PHOSPHORUS: 3.4 mg/dL (ref 2.5–4.5)
Potassium: 4.4 mmol/L (ref 3.5–5.2)
Sodium: 141 mmol/L (ref 134–144)

## 2014-07-05 LAB — HEMOGLOBIN A1C
Est. average glucose Bld gHb Est-mCnc: 183 mg/dL
HEMOGLOBIN A1C: 8 % — AB (ref 4.8–5.6)

## 2014-07-05 LAB — LIPID PANEL
CHOL/HDL RATIO: 4.6 ratio — AB (ref 0.0–4.4)
CHOLESTEROL TOTAL: 173 mg/dL (ref 100–199)
HDL: 38 mg/dL — ABNORMAL LOW (ref >39)
LDL Calculated: 102 mg/dL — ABNORMAL HIGH (ref 0–99)
Triglycerides: 163 mg/dL — ABNORMAL HIGH (ref 0–149)
VLDL Cholesterol Cal: 33 mg/dL (ref 5–40)

## 2014-07-05 LAB — VITAMIN D 25 HYDROXY (VIT D DEFICIENCY, FRACTURES): Vit D, 25-Hydroxy: 17 ng/mL — ABNORMAL LOW (ref 30.0–100.0)

## 2014-07-05 LAB — TSH: TSH: 3.28 u[IU]/mL (ref 0.450–4.500)

## 2014-07-06 ENCOUNTER — Inpatient Hospital Stay: Payer: Medicare Other | Attending: Internal Medicine

## 2014-07-06 ENCOUNTER — Telehealth: Payer: Self-pay

## 2014-07-06 NOTE — Telephone Encounter (Signed)
-----   Message from Birdie Sons, MD sent at 07/05/2014 10:02 PM EDT ----- A1c is down from 10 to 8.0. Want it to be below 8.0. Continue current medications.  Try to watch diet better. Vitamin d level is still low. Need to increase vitamin d supplement to 2000 units daily. Otherwise labs are good. Follow up 4 months re diabetes.

## 2014-07-06 NOTE — Telephone Encounter (Signed)
Tried calling, no answer.  07/06/2014  Thanks,   -Mickel Baas

## 2014-07-08 NOTE — Telephone Encounter (Signed)
Patient notified of results. Expressed understanding.

## 2014-07-11 ENCOUNTER — Ambulatory Visit: Payer: Medicare Other

## 2014-07-12 ENCOUNTER — Other Ambulatory Visit: Payer: Medicare Other

## 2014-07-20 ENCOUNTER — Inpatient Hospital Stay: Payer: Medicare Other | Attending: Internal Medicine

## 2014-07-20 DIAGNOSIS — D72820 Lymphocytosis (symptomatic): Secondary | ICD-10-CM | POA: Diagnosis not present

## 2014-07-20 DIAGNOSIS — D72829 Elevated white blood cell count, unspecified: Secondary | ICD-10-CM | POA: Diagnosis not present

## 2014-07-20 DIAGNOSIS — Z452 Encounter for adjustment and management of vascular access device: Secondary | ICD-10-CM | POA: Diagnosis not present

## 2014-07-20 DIAGNOSIS — Z85048 Personal history of other malignant neoplasm of rectum, rectosigmoid junction, and anus: Secondary | ICD-10-CM | POA: Insufficient documentation

## 2014-07-20 DIAGNOSIS — C801 Malignant (primary) neoplasm, unspecified: Secondary | ICD-10-CM

## 2014-07-20 MED ORDER — HEPARIN SOD (PORK) LOCK FLUSH 100 UNIT/ML IV SOLN
INTRAVENOUS | Status: AC
  Filled 2014-07-20: qty 5

## 2014-07-20 MED ORDER — SODIUM CHLORIDE 0.9 % IJ SOLN
10.0000 mL | INTRAMUSCULAR | Status: DC | PRN
  Administered 2014-07-20: 10 mL via INTRAVENOUS
  Filled 2014-07-20: qty 10

## 2014-07-20 MED ORDER — HEPARIN SOD (PORK) LOCK FLUSH 100 UNIT/ML IV SOLN
500.0000 [IU] | Freq: Once | INTRAVENOUS | Status: AC
  Administered 2014-07-20: 500 [IU] via INTRAVENOUS

## 2014-08-03 ENCOUNTER — Inpatient Hospital Stay: Payer: Medicare Other

## 2014-08-08 ENCOUNTER — Ambulatory Visit
Admission: RE | Admit: 2014-08-08 | Discharge: 2014-08-08 | Disposition: A | Discharge status: Home / Self Care | Payer: Medicare Other | Source: Ambulatory Visit | Attending: Family Medicine | Admitting: Family Medicine

## 2014-08-08 DIAGNOSIS — M85862 Other specified disorders of bone density and structure, left lower leg: Secondary | ICD-10-CM | POA: Insufficient documentation

## 2014-08-08 DIAGNOSIS — M8580 Other specified disorders of bone density and structure, unspecified site: Secondary | ICD-10-CM | POA: Diagnosis present

## 2014-08-10 ENCOUNTER — Telehealth: Payer: Self-pay | Admitting: Family Medicine

## 2014-08-10 NOTE — Telephone Encounter (Signed)
Advised patient of BMD results.

## 2014-08-10 NOTE — Telephone Encounter (Signed)
Pt is returning call.  CB#[phone]

## 2014-08-17 ENCOUNTER — Inpatient Hospital Stay: Payer: Medicare Other | Attending: Internal Medicine

## 2014-08-17 VITALS — BP 167/78 | HR 65 | Temp 96.7°F | Resp 18

## 2014-08-17 DIAGNOSIS — C801 Malignant (primary) neoplasm, unspecified: Secondary | ICD-10-CM

## 2014-08-17 DIAGNOSIS — Z85048 Personal history of other malignant neoplasm of rectum, rectosigmoid junction, and anus: Secondary | ICD-10-CM | POA: Insufficient documentation

## 2014-08-17 DIAGNOSIS — D72829 Elevated white blood cell count, unspecified: Secondary | ICD-10-CM | POA: Diagnosis present

## 2014-08-17 DIAGNOSIS — Z452 Encounter for adjustment and management of vascular access device: Secondary | ICD-10-CM | POA: Insufficient documentation

## 2014-08-17 MED ORDER — SODIUM CHLORIDE 0.9 % IJ SOLN
10.0000 mL | INTRAMUSCULAR | Status: DC | PRN
  Administered 2014-08-17: 10 mL via INTRAVENOUS
  Filled 2014-08-17: qty 10

## 2014-08-17 MED ORDER — HEPARIN SOD (PORK) LOCK FLUSH 100 UNIT/ML IV SOLN
500.0000 [IU] | Freq: Once | INTRAVENOUS | Status: AC
  Administered 2014-08-17: 500 [IU] via INTRAVENOUS
  Filled 2014-08-17: qty 5

## 2014-08-31 ENCOUNTER — Inpatient Hospital Stay: Payer: Medicare Other

## 2014-09-14 ENCOUNTER — Inpatient Hospital Stay: Payer: Medicare Other

## 2014-09-14 DIAGNOSIS — D72829 Elevated white blood cell count, unspecified: Secondary | ICD-10-CM | POA: Diagnosis not present

## 2014-09-14 DIAGNOSIS — C801 Malignant (primary) neoplasm, unspecified: Secondary | ICD-10-CM

## 2014-09-14 MED ORDER — HEPARIN SOD (PORK) LOCK FLUSH 100 UNIT/ML IV SOLN
500.0000 [IU] | Freq: Once | INTRAVENOUS | Status: AC
  Administered 2014-09-14: 500 [IU] via INTRAVENOUS

## 2014-09-14 MED ORDER — SODIUM CHLORIDE 0.9 % IJ SOLN
10.0000 mL | Freq: Once | INTRAMUSCULAR | Status: DC
  Filled 2014-09-14: qty 10

## 2014-09-28 ENCOUNTER — Inpatient Hospital Stay: Payer: Medicare Other

## 2014-09-28 ENCOUNTER — Other Ambulatory Visit: Payer: Self-pay

## 2014-10-12 ENCOUNTER — Inpatient Hospital Stay: Payer: Medicare Other | Attending: Internal Medicine

## 2014-10-12 ENCOUNTER — Inpatient Hospital Stay: Payer: Medicare Other

## 2014-10-12 DIAGNOSIS — C189 Malignant neoplasm of colon, unspecified: Secondary | ICD-10-CM

## 2014-10-12 DIAGNOSIS — D72829 Elevated white blood cell count, unspecified: Secondary | ICD-10-CM | POA: Diagnosis not present

## 2014-10-12 DIAGNOSIS — C801 Malignant (primary) neoplasm, unspecified: Secondary | ICD-10-CM

## 2014-10-12 DIAGNOSIS — D751 Secondary polycythemia: Secondary | ICD-10-CM

## 2014-10-12 LAB — CBC WITH DIFFERENTIAL/PLATELET
BASOS ABS: 0.1 10*3/uL (ref 0–0.1)
BASOS PCT: 1 %
EOS PCT: 3 %
Eosinophils Absolute: 0.2 10*3/uL (ref 0–0.7)
HEMATOCRIT: 44.1 % (ref 35.0–47.0)
Hemoglobin: 15.3 g/dL (ref 12.0–16.0)
Lymphocytes Relative: 48 %
Lymphs Abs: 4.7 10*3/uL — ABNORMAL HIGH (ref 1.0–3.6)
MCH: 31.8 pg (ref 26.0–34.0)
MCHC: 34.7 g/dL (ref 32.0–36.0)
MCV: 91.9 fL (ref 80.0–100.0)
MONO ABS: 1.5 10*3/uL — AB (ref 0.2–0.9)
Monocytes Relative: 16 %
NEUTROS ABS: 3.3 10*3/uL (ref 1.4–6.5)
Neutrophils Relative %: 34 %
PLATELETS: 280 10*3/uL (ref 150–440)
RBC: 4.8 MIL/uL (ref 3.80–5.20)
RDW: 13.4 % (ref 11.5–14.5)
WBC: 9.9 10*3/uL (ref 3.6–11.0)

## 2014-10-12 MED ORDER — SODIUM CHLORIDE 0.9 % IJ SOLN
10.0000 mL | Freq: Once | INTRAMUSCULAR | Status: AC
  Administered 2014-10-12: 10 mL via INTRAVENOUS
  Filled 2014-10-12: qty 10

## 2014-10-12 MED ORDER — HEPARIN SOD (PORK) LOCK FLUSH 100 UNIT/ML IV SOLN
500.0000 [IU] | Freq: Once | INTRAVENOUS | Status: AC
  Administered 2014-10-12: 500 [IU] via INTRAVENOUS
  Filled 2014-10-12: qty 5

## 2014-10-26 ENCOUNTER — Inpatient Hospital Stay: Payer: Medicare Other

## 2014-11-09 ENCOUNTER — Inpatient Hospital Stay: Payer: Medicare Other | Attending: Internal Medicine

## 2014-11-09 DIAGNOSIS — Z452 Encounter for adjustment and management of vascular access device: Secondary | ICD-10-CM | POA: Diagnosis not present

## 2014-11-09 DIAGNOSIS — D72829 Elevated white blood cell count, unspecified: Secondary | ICD-10-CM | POA: Insufficient documentation

## 2014-11-09 DIAGNOSIS — C801 Malignant (primary) neoplasm, unspecified: Secondary | ICD-10-CM

## 2014-11-09 MED ORDER — SODIUM CHLORIDE 0.9 % IJ SOLN
10.0000 mL | INTRAMUSCULAR | Status: DC | PRN
  Administered 2014-11-09: 10 mL via INTRAVENOUS
  Filled 2014-11-09: qty 10

## 2014-11-09 MED ORDER — HEPARIN SOD (PORK) LOCK FLUSH 100 UNIT/ML IV SOLN
500.0000 [IU] | Freq: Once | INTRAVENOUS | Status: AC
  Administered 2014-11-09: 500 [IU] via INTRAVENOUS
  Filled 2014-11-09: qty 5

## 2014-11-23 ENCOUNTER — Inpatient Hospital Stay: Payer: Medicare Other

## 2014-12-07 ENCOUNTER — Inpatient Hospital Stay: Payer: Medicare Other | Attending: Family Medicine

## 2014-12-07 DIAGNOSIS — D72829 Elevated white blood cell count, unspecified: Secondary | ICD-10-CM | POA: Diagnosis not present

## 2014-12-07 DIAGNOSIS — Z452 Encounter for adjustment and management of vascular access device: Secondary | ICD-10-CM | POA: Insufficient documentation

## 2014-12-07 DIAGNOSIS — C801 Malignant (primary) neoplasm, unspecified: Secondary | ICD-10-CM

## 2014-12-07 MED ORDER — HEPARIN SOD (PORK) LOCK FLUSH 100 UNIT/ML IV SOLN: 500.0000 [IU] | Freq: Once | INTRAVENOUS | Status: DC

## 2014-12-07 MED ORDER — SODIUM CHLORIDE 0.9 % IJ SOLN
10.0000 mL | INTRAMUSCULAR | Status: DC | PRN
  Filled 2014-12-07: qty 10

## 2014-12-07 MED ORDER — HEPARIN SOD (PORK) LOCK FLUSH 100 UNIT/ML IV SOLN
INTRAVENOUS | Status: AC
  Filled 2014-12-07: qty 5

## 2014-12-21 ENCOUNTER — Inpatient Hospital Stay: Payer: Medicare Other

## 2015-01-03 ENCOUNTER — Other Ambulatory Visit: Payer: Self-pay | Admitting: *Deleted

## 2015-01-03 MED ORDER — WARFARIN SODIUM 1 MG PO TABS: 1.0000 mg | ORAL_TABLET | Freq: Every day | ORAL | Status: DC

## 2015-01-04 ENCOUNTER — Inpatient Hospital Stay: Payer: Medicare Other | Attending: Family Medicine

## 2015-01-04 DIAGNOSIS — C801 Malignant (primary) neoplasm, unspecified: Secondary | ICD-10-CM

## 2015-01-04 MED ORDER — HEPARIN SOD (PORK) LOCK FLUSH 100 UNIT/ML IV SOLN: 500.0000 [IU] | Freq: Once | INTRAVENOUS | Status: AC

## 2015-01-04 MED ORDER — SODIUM CHLORIDE 0.9 % IJ SOLN
10.0000 mL | Freq: Once | INTRAMUSCULAR | Status: DC
  Filled 2015-01-04: qty 10

## 2015-01-18 ENCOUNTER — Inpatient Hospital Stay: Payer: Medicare Other

## 2015-02-01 ENCOUNTER — Inpatient Hospital Stay: Payer: Medicare Other | Attending: Family Medicine

## 2015-02-01 DIAGNOSIS — D72829 Elevated white blood cell count, unspecified: Secondary | ICD-10-CM | POA: Diagnosis not present

## 2015-02-01 DIAGNOSIS — Z452 Encounter for adjustment and management of vascular access device: Secondary | ICD-10-CM | POA: Insufficient documentation

## 2015-02-01 DIAGNOSIS — C801 Malignant (primary) neoplasm, unspecified: Secondary | ICD-10-CM

## 2015-02-01 MED ORDER — HEPARIN SOD (PORK) LOCK FLUSH 100 UNIT/ML IV SOLN
500.0000 [IU] | Freq: Once | INTRAVENOUS | Status: AC
  Administered 2015-02-01: 500 [IU] via INTRAVENOUS
  Filled 2015-02-01: qty 5

## 2015-02-01 MED ORDER — SODIUM CHLORIDE 0.9 % IJ SOLN
10.0000 mL | Freq: Once | INTRAMUSCULAR | Status: AC
  Administered 2015-02-01: 10 mL via INTRAVENOUS
  Filled 2015-02-01: qty 10

## 2015-02-15 ENCOUNTER — Ambulatory Visit: Payer: Self-pay | Admitting: Internal Medicine

## 2015-02-15 ENCOUNTER — Other Ambulatory Visit: Payer: Self-pay

## 2015-02-28 ENCOUNTER — Other Ambulatory Visit: Payer: Self-pay | Admitting: *Deleted

## 2015-02-28 ENCOUNTER — Encounter: Payer: Self-pay | Admitting: *Deleted

## 2015-02-28 DIAGNOSIS — Z85038 Personal history of other malignant neoplasm of large intestine: Secondary | ICD-10-CM

## 2015-03-01 ENCOUNTER — Inpatient Hospital Stay (HOSPITAL_BASED_OUTPATIENT_CLINIC_OR_DEPARTMENT_OTHER): Payer: Medicare Other | Admitting: Internal Medicine

## 2015-03-01 ENCOUNTER — Inpatient Hospital Stay: Payer: Medicare Other

## 2015-03-01 ENCOUNTER — Inpatient Hospital Stay: Payer: Medicare Other | Attending: Internal Medicine

## 2015-03-01 ENCOUNTER — Ambulatory Visit: Payer: Self-pay | Admitting: Internal Medicine

## 2015-03-01 ENCOUNTER — Other Ambulatory Visit: Payer: Self-pay | Admitting: *Deleted

## 2015-03-01 VITALS — BP 148/80 | HR 100 | Temp 97.8°F | Resp 20

## 2015-03-01 DIAGNOSIS — Z85048 Personal history of other malignant neoplasm of rectum, rectosigmoid junction, and anus: Secondary | ICD-10-CM | POA: Insufficient documentation

## 2015-03-01 DIAGNOSIS — Z85038 Personal history of other malignant neoplasm of large intestine: Secondary | ICD-10-CM

## 2015-03-01 DIAGNOSIS — D72820 Lymphocytosis (symptomatic): Secondary | ICD-10-CM | POA: Diagnosis not present

## 2015-03-01 DIAGNOSIS — F419 Anxiety disorder, unspecified: Secondary | ICD-10-CM | POA: Insufficient documentation

## 2015-03-01 DIAGNOSIS — Z79899 Other long term (current) drug therapy: Secondary | ICD-10-CM

## 2015-03-01 DIAGNOSIS — R110 Nausea: Secondary | ICD-10-CM

## 2015-03-01 DIAGNOSIS — Z7901 Long term (current) use of anticoagulants: Secondary | ICD-10-CM | POA: Diagnosis not present

## 2015-03-01 DIAGNOSIS — I10 Essential (primary) hypertension: Secondary | ICD-10-CM

## 2015-03-01 DIAGNOSIS — Z95828 Presence of other vascular implants and grafts: Secondary | ICD-10-CM

## 2015-03-01 LAB — CBC WITH DIFFERENTIAL/PLATELET
Basophils Absolute: 0.2 10*3/uL — ABNORMAL HIGH (ref 0–0.1)
Basophils Relative: 2 %
EOS ABS: 0.3 10*3/uL (ref 0–0.7)
Eosinophils Relative: 3 %
HCT: 44.1 % (ref 35.0–47.0)
HEMOGLOBIN: 15.6 g/dL (ref 12.0–16.0)
LYMPHS ABS: 4.6 10*3/uL — AB (ref 1.0–3.6)
Lymphocytes Relative: 41 %
MCH: 32.8 pg (ref 26.0–34.0)
MCHC: 35.4 g/dL (ref 32.0–36.0)
MCV: 92.8 fL (ref 80.0–100.0)
MONO ABS: 1.5 10*3/uL — AB (ref 0.2–0.9)
MONOS PCT: 13 %
NEUTROS PCT: 41 %
Neutro Abs: 4.5 10*3/uL (ref 1.4–6.5)
Platelets: 232 10*3/uL (ref 150–440)
RBC: 4.75 MIL/uL (ref 3.80–5.20)
RDW: 13.5 % (ref 11.5–14.5)
WBC: 11 10*3/uL (ref 3.6–11.0)

## 2015-03-01 LAB — COMPREHENSIVE METABOLIC PANEL
ALK PHOS: 60 U/L (ref 38–126)
ALT: 18 U/L (ref 14–54)
ANION GAP: 7 (ref 5–15)
AST: 19 U/L (ref 15–41)
Albumin: 3.7 g/dL (ref 3.5–5.0)
BILIRUBIN TOTAL: 1.3 mg/dL — AB (ref 0.3–1.2)
BUN: 19 mg/dL (ref 6–20)
CALCIUM: 8.8 mg/dL — AB (ref 8.9–10.3)
CO2: 28 mmol/L (ref 22–32)
Chloride: 100 mmol/L — ABNORMAL LOW (ref 101–111)
Creatinine, Ser: 1.06 mg/dL — ABNORMAL HIGH (ref 0.44–1.00)
GFR calc non Af Amer: 51 mL/min — ABNORMAL LOW (ref >60)
GFR, EST AFRICAN AMERICAN: 59 mL/min — AB (ref >60)
GLUCOSE: 192 mg/dL — AB (ref 65–99)
Potassium: 3.3 mmol/L — ABNORMAL LOW (ref 3.5–5.1)
SODIUM: 135 mmol/L (ref 135–145)
TOTAL PROTEIN: 7.2 g/dL (ref 6.5–8.1)

## 2015-03-01 MED ORDER — ALPRAZOLAM 0.25 MG PO TABS: 0.2500 mg | ORAL_TABLET | Freq: Every day | ORAL | Status: DC | PRN

## 2015-03-01 MED ORDER — HEPARIN SOD (PORK) LOCK FLUSH 100 UNIT/ML IV SOLN
500.0000 [IU] | Freq: Once | INTRAVENOUS | Status: AC
  Administered 2015-03-01: 500 [IU] via INTRAVENOUS

## 2015-03-01 MED ORDER — PROMETHAZINE HCL 25 MG PO TABS: 25.0000 mg | ORAL_TABLET | Freq: Four times a day (QID) | ORAL | Status: DC | PRN

## 2015-03-01 MED ORDER — SODIUM CHLORIDE 0.9% FLUSH
10.0000 mL | INTRAVENOUS | Status: DC | PRN
  Administered 2015-03-01: 10 mL via INTRAVENOUS
  Filled 2015-03-01: qty 10

## 2015-03-01 NOTE — Progress Notes (Signed)
[facility] OFFICE PROGRESS NOTE  Patient Care Team: Birdie Sons, MD as PCP - General (Family Medicine) Leia Alf, MD as Attending Physician (Internal Medicine)   SUMMARY OF ONCOLOGIC HISTORY:  # 2004- Rectal cancer stage II- s/p Sx; Chemo-RT; 5FU finished May 2004. Recurrent Left Lower abodmen/hydronephrosis- s/p Pal RT [Feb 2005]; Remission  # Mild Lymphocytosis ~4.5 ALC; white count-N; # Erythrocytosis- Jak-2 Neg; Phlebotomy last ~ 2012.    INTERVAL HISTORY:  This is my first interaction with the patient since I joined the practice September 2016. I reviewed the patient's prior charts/pertinent labs we'llin detail; findings are summarized above.   A very pleasant 75 year old female patient with above history of metastatic rectal cancer/recurrence- left lower abdomen status post palliative radiation 2005; and also history of lymphocytosis is here for follow-up.   Patient has intermittent nausea/ and episodes of anxiety for which she takes Phenergan and Xanax as needed.  Otherwise denies any unusual abdominal pain nausea vomiting blood in stools or black stools.No unusual shortness of breath and chest pain.  REVIEW OF SYSTEMS:  A complete 10 point review of system is done which is negative except mentioned above/history of present illness.   PAST MEDICAL HISTORY :  Past Medical History  Diagnosis Date  . Erythrocytosis 06/05/2014  . Leukocytosis 06/05/2014  . Rectal cancer ([place]) 05/2002  . History of radiation therapy     PAST SURGICAL HISTORY :   Past Surgical History  Procedure Laterality Date  . Cholecystectomy      Laporoscopic  . Colon resection      Anterior  . Laparoscopic salpingo oopherectomy Left     FAMILY HISTORY :   Family History  Problem Relation Age of Onset  . Stroke Mother   . CAD Mother   . Lung cancer Father   . Prostate cancer Father   . Diabetes Sister   . Hypertension Sister   . Hypothyroidism Sister   . CAD Sister    . Diabetes Sister   . Hypertension Sister   . Hypothyroidism Sister   . Transient ischemic attack Sister     SOCIAL HISTORY:   Social History  Substance Use Topics  . Smoking status: Never Smoker   . Smokeless tobacco: Never Used  . Alcohol Use: No    ALLERGIES:  is allergic to codeine; influenza vaccines; iodinated diagnostic agents; metformin hcl; penicillins; and sulfa antibiotics.  MEDICATIONS:  Current Outpatient Prescriptions  Medication Sig Dispense Refill  . ALPRAZolam (XANAX) 0.25 MG tablet Take 0.25 mg by mouth as needed for anxiety.     Marland Kitchen lisinopril-hydrochlorothiazide (PRINZIDE,ZESTORETIC) 10-12.5 MG per tablet Take 1 tablet by mouth daily. 90 tablet 3  . promethazine (PHENERGAN) 25 MG tablet Take 1 tablet by mouth every 6 (six) hours as needed. nausea  1  . warfarin (COUMADIN) 1 MG tablet Take 1 tablet (1 mg total) by mouth daily. 90 tablet 5  . albuterol (PROAIR HFA) 108 (90 BASE) MCG/ACT inhaler Inhale 2 puffs into the lungs as needed. Reported on 03/01/2015    . cholecalciferol (VITAMIN D) 1000 UNITS tablet Take 1,000 Units by mouth daily. Reported on 03/01/2015     No current facility-administered medications for this visit.   Facility-Administered Medications Ordered in Other Visits  Medication Dose Route Frequency Provider Last Rate Last Dose  . heparin lock flush 100 unit/mL  500 Units Intracatheter Q6 weeks Leia Alf, MD      . heparin lock flush 100 unit/mL  500 Units Intravenous  Once Evlyn Kanner, NP      . sodium chloride 0.9 % injection 10 mL  10 mL Intracatheter Q6 weeks Leia Alf, MD      . sodium chloride 0.9 % injection 10 mL  10 mL Intravenous PRN Leia Alf, MD   10 mL at 06/08/14 1458  . sodium chloride 0.9 % injection 10 mL  10 mL Intravenous PRN Leia Alf, MD   10 mL at 08/17/14 1352  . sodium chloride 0.9 % injection 10 mL  10 mL Intravenous Once Evlyn Kanner, NP        PHYSICAL EXAMINATION:   BP 148/80 mmHg   Pulse 100  Temp(Src) 97.8 F (36.6 C) (Tympanic)  Resp 20  There were no vitals filed for this visit.  GENERAL: Well-nourished well-developed; Alert, no distress and comfortable. Accompanied by family.   EYES: no pallor or icterus OROPHARYNX: no thrush or ulceration; good dentition  NECK: supple, no masses felt LYMPH:  no palpable lymphadenopathy in the cervical, axillary or inguinal regions LUNGS: clear to auscultation and  No wheeze or crackles HEART/CVS: regular rate & rhythm and no murmurs; No lower extremity edema ABDOMEN:abdomen soft, non-tender and normal bowel sounds Musculoskeletal:no cyanosis of digits and no clubbing  PSYCH: alert & oriented x 3 with fluent speech NEURO: no focal motor/sensory deficits SKIN:  no rashes or significant lesions  LABORATORY DATA:  I have reviewed the data as listed    Component Value Date/Time   NA 135 03/01/2015 1345   NA 141 07/04/2014 1115   NA 139 03/17/2013 1502   K 3.3* 03/01/2015 1345   K 4.1 03/23/2013 1254   CL 100* 03/01/2015 1345   CL 106 03/17/2013 1502   CO2 28 03/01/2015 1345   CO2 25 03/17/2013 1502   GLUCOSE 192* 03/01/2015 1345   GLUCOSE 161* 07/04/2014 1115   GLUCOSE 125* 03/17/2013 1502   BUN 19 03/01/2015 1345   BUN 20 07/04/2014 1115   BUN 19* 03/17/2013 1502   CREATININE 1.06* 03/01/2015 1345   CREATININE 1.18 02/16/2014 1430   CALCIUM 8.8* 03/01/2015 1345   CALCIUM 7.4* 03/17/2013 1502   PROT 7.2 03/01/2015 1345   PROT 7.2 02/16/2014 1430   ALBUMIN 3.7 03/01/2015 1345   ALBUMIN 3.6 07/04/2014 1115   ALBUMIN 3.0* 02/16/2014 1430   AST 19 03/01/2015 1345   AST 16 02/16/2014 1430   ALT 18 03/01/2015 1345   ALT 21 02/16/2014 1430   ALKPHOS 60 03/01/2015 1345   ALKPHOS 74 02/16/2014 1430   BILITOT 1.3* 03/01/2015 1345   BILITOT 0.9 02/16/2014 1430   GFRNONAA 51* 03/01/2015 1345   GFRNONAA 48* 02/16/2014 1430   GFRNONAA 48* 03/17/2013 1502   GFRAA 59* 03/01/2015 1345   GFRAA 58* 02/16/2014 1430    GFRAA 55* 03/17/2013 1502    No results found for: SPEP, UPEP  Lab Results  Component Value Date   WBC 11.0 03/01/2015   NEUTROABS 4.5 03/01/2015   HGB 15.6 03/01/2015   HCT 44.1 03/01/2015   MCV 92.8 03/01/2015   PLT 232 03/01/2015      Chemistry      Component Value Date/Time   NA 135 03/01/2015 1345   NA 141 07/04/2014 1115   NA 139 03/17/2013 1502   K 3.3* 03/01/2015 1345   K 4.1 03/23/2013 1254   CL 100* 03/01/2015 1345   CL 106 03/17/2013 1502   CO2 28 03/01/2015 1345   CO2 25 03/17/2013 1502   BUN 19  03/01/2015 1345   BUN 20 07/04/2014 1115   BUN 19* 03/17/2013 1502   CREATININE 1.06* 03/01/2015 1345   CREATININE 1.18 02/16/2014 1430      Component Value Date/Time   CALCIUM 8.8* 03/01/2015 1345   CALCIUM 7.4* 03/17/2013 1502   ALKPHOS 60 03/01/2015 1345   ALKPHOS 74 02/16/2014 1430   AST 19 03/01/2015 1345   AST 16 02/16/2014 1430   ALT 18 03/01/2015 1345   ALT 21 02/16/2014 1430   BILITOT 1.3* 03/01/2015 1345   BILITOT 0.9 02/16/2014 1430       RADIOGRAPHIC STUDIES: I have personally reviewed the radiological images as listed and agreed with the findings in the report. No results found.   ASSESSMENT & PLAN:   # rectal cancer- with recurrence [2005] left pelvic area- status post palliative radiation. Currently in remission. Patient is likely cured of her rectal cancer.   # mild lymphocytosis/without any absolute leukocytosis- reviewed the labs in detail. Normal hemoglobin and platelets.  # port flushes every 5 weeks as per patient preference.   # anxiety/mild nausea- Xanax and Phenergan prescribed.  # . She'll follow-up with me in one year; labs. A copy of the labs given  to the patient.  # 15 minutes face-to-face with the patient discussing the above plan of care; more than 50% of time spent on natural history; counseling and coordination.      Cammie Sickle, MD 03/01/2015 2:50 PM

## 2015-03-02 LAB — CEA: CEA: 2.2 ng/mL (ref 0.0–4.7)

## 2015-03-23 ENCOUNTER — Encounter: Payer: Self-pay | Admitting: Family Medicine

## 2015-03-23 ENCOUNTER — Ambulatory Visit (INDEPENDENT_AMBULATORY_CARE_PROVIDER_SITE_OTHER): Payer: Medicare Other | Admitting: Family Medicine

## 2015-03-23 VITALS — BP 140/80 | HR 73 | Temp 97.5°F | Resp 16 | Ht 60.0 in | Wt 195.0 lb

## 2015-03-23 DIAGNOSIS — E119 Type 2 diabetes mellitus without complications: Secondary | ICD-10-CM

## 2015-03-23 DIAGNOSIS — J309 Allergic rhinitis, unspecified: Secondary | ICD-10-CM | POA: Diagnosis not present

## 2015-03-23 DIAGNOSIS — J40 Bronchitis, not specified as acute or chronic: Secondary | ICD-10-CM | POA: Diagnosis not present

## 2015-03-23 LAB — POCT GLYCOSYLATED HEMOGLOBIN (HGB A1C)
Est. average glucose Bld gHb Est-mCnc: 200
HEMOGLOBIN A1C: 8.6

## 2015-03-23 MED ORDER — AZITHROMYCIN 250 MG PO TABS: ORAL_TABLET | ORAL | Status: AC

## 2015-03-23 MED ORDER — ALBUTEROL SULFATE HFA 108 (90 BASE) MCG/ACT IN AERS: 2.0000 | INHALATION_SPRAY | RESPIRATORY_TRACT | Status: DC | PRN

## 2015-03-23 MED ORDER — MONTELUKAST SODIUM 10 MG PO TABS: 10.0000 mg | ORAL_TABLET | Freq: Every day | ORAL | Status: DC

## 2015-03-23 NOTE — Progress Notes (Signed)
Patient: Danielle Rowland Female    DOB: 1940/07/25   75 y.o.   MRN: [id number] Visit Date: 03/23/2015  Today's Provider: Lelon Huh, MD   Chief Complaint  Patient presents with  . Follow-up  . Diabetes  . Hypertension  . Cough   Subjective:    Cough This is a new problem. The current episode started in the past 7 days (03/19/2015). The problem has been gradually worsening. The problem occurs every few minutes. The cough is productive of sputum. Associated symptoms include headaches, shortness of breath and wheezing. Pertinent negatives include no chest pain, chills, ear congestion, ear pain, fever, heartburn, hemoptysis, myalgias, nasal congestion, postnasal drip, rhinorrhea, sore throat, sweats or weight loss. Nothing aggravates the symptoms. She has tried OTC cough suppressant for the symptoms. The treatment provided no relief. Her past medical history is significant for bronchitis and pneumonia.    States allergies have been much worse the last month. Cough was productive green sputum, but now just feels congested in chest. Nose and sinuses have been very stuffy. Tried several OTC antihistamines with little relief.    Follow-up for Vitamin D deficiency from 07/04/2014; increased Vitamin D supplement to 2,00 units qd.   Diabetes Mellitus Type II, Follow-up:   Lab Results  Component Value Date   HGBA1C 8.0* 07/04/2014   Last seen for diabetes 9 months ago.  Management since then includes; advised better diet. She reports good compliance with treatment. She is not having side effects. n/a Current symptoms include none and have been stable. Home blood sugar records: fasting range: n/a  Episodes of hypoglycemia? no   Current Insulin Regimen: n/a Most Recent Eye Exam: 10/2014 Weight trend: stable Prior visit with dietician: no Current diet: well balanced Current exercise: walking  Sugars have been running higher since she has been sick the last month.    ----------------------------------------------------------------------   Hypertension, follow-up:  BP Readings from Last 3 Encounters:  03/23/15 140/80  03/01/15 148/80  08/17/14 167/78    She was last seen for hypertension 9 months ago.  BP at that visit was 134/82. Management since that visit includes; no changes.She reports good compliance with treatment. She is not having side effects. none  She is exercising. She is not adherent to low salt diet.   Outside blood pressures are n/a. She is experiencing none.  Patient denies none.   Cardiovascular risk factors include diabetes mellitus.  Use of agents associated with hypertension: none.   ----------------------------------------------------------------------    Allergies  Allergen Reactions  . Codeine   . Influenza Vaccines   . Iodinated Diagnostic Agents     IVP Dye  . Metformin Hcl Nausea Only and Nausea And Vomiting  . Penicillins   . Sulfa Antibiotics    Previous Medications   ALBUTEROL (PROAIR HFA) 108 (90 BASE) MCG/ACT INHALER    Inhale 2 puffs into the lungs as needed. Reported on 03/01/2015   ALPRAZOLAM (XANAX) 0.25 MG TABLET    Take 1 tablet (0.25 mg total) by mouth daily as needed for anxiety.   CHOLECALCIFEROL (VITAMIN D) 1000 UNITS TABLET    Take 1,000 Units by mouth daily. Reported on 03/01/2015   LISINOPRIL-HYDROCHLOROTHIAZIDE (PRINZIDE,ZESTORETIC) 10-12.5 MG PER TABLET    Take 1 tablet by mouth daily.   PROMETHAZINE (PHENERGAN) 25 MG TABLET    Take 1 tablet (25 mg total) by mouth every 6 (six) hours as needed. nausea   WARFARIN (COUMADIN) 1 MG TABLET    Take 1  tablet (1 mg total) by mouth daily.    Review of Systems  Constitutional: Negative for fever, chills and weight loss.  HENT: Negative for ear pain, postnasal drip, rhinorrhea and sore throat.   Respiratory: Positive for cough, shortness of breath and wheezing. Negative for hemoptysis.   Cardiovascular: Negative for chest pain.   Gastrointestinal: Negative for heartburn.  Musculoskeletal: Negative for myalgias.  Neurological: Positive for headaches.    Social History  Substance Use Topics  . Smoking status: Never Smoker   . Smokeless tobacco: Never Used  . Alcohol Use: No   Objective:   BP 140/80 mmHg  Pulse 73  Temp(Src) 97.5 F (36.4 C) (Oral)  Resp 16  Ht 5' (1.524 m)  Wt 195 lb (88.451 kg)  BMI 38.08 kg/m2  SpO2 93%  Physical Exam  General Appearance:    Alert, cooperative, no distress  HENT:   ENT exam normal, no neck nodes or sinus tenderness, bilateral TM normal without fluid or infection, neck without nodes, throat normal without erythema or exudate, sinuses nontender and nasal mucosa pale and congested  Eyes:    PERRL, conjunctiva/corneas clear, EOM's intact       Lungs:     Occasional expiratory wheezes, no rales or rhonchi. bilaterally, respirations unlabored  Heart:    Regular rate and rhythm  Neurologic:   Awake, alert, oriented x 3. No apparent focal neurological           defect.        Results for orders placed or performed in visit on 03/23/15  POCT glycosylated hemoglobin (Hb A1C)  Result Value Ref Range   Hemoglobin A1C 8.6    Est. average glucose Bld gHb Est-mCnc 200        Assessment & Plan:     1. Type 2 diabetes mellitus without complication, without long-term current use of insulin (HCC) Worse today, which she relates to poor eating habits since she has been sick the last month. Work on diet and exercise. Recheck A1c in 3 months.  - POCT glycosylated hemoglobin (Hb A1C)  2. Allergic rhinitis, unspecified allergic rhinitis type  - montelukast (SINGULAIR) 10 MG tablet; Take 1 tablet (10 mg total) by mouth at bedtime.  Dispense: 30 tablet; Refill: 3  3. Bronchitis  - azithromycin (ZITHROMAX) 250 MG tablet; 2 by mouth today, then 1 daily for 4 days  Dispense: 6 tablet; Refill: 0 Refill albuterol inhaler.   Call if symptoms change or if not rapidly improving.           Lelon Huh, MD  [facility]

## 2015-03-27 ENCOUNTER — Encounter: Payer: Self-pay | Admitting: Family Medicine

## 2015-04-03 ENCOUNTER — Ambulatory Visit (INDEPENDENT_AMBULATORY_CARE_PROVIDER_SITE_OTHER): Payer: Medicare Other | Admitting: Family Medicine

## 2015-04-03 VITALS — BP 140/78 | HR 74 | Temp 98.5°F | Resp 18 | Wt 190.0 lb

## 2015-04-03 DIAGNOSIS — J40 Bronchitis, not specified as acute or chronic: Secondary | ICD-10-CM

## 2015-04-03 DIAGNOSIS — R05 Cough: Secondary | ICD-10-CM | POA: Diagnosis not present

## 2015-04-03 DIAGNOSIS — R059 Cough, unspecified: Secondary | ICD-10-CM

## 2015-04-03 MED ORDER — DOXYCYCLINE HYCLATE 100 MG PO TABS: 100.0000 mg | ORAL_TABLET | Freq: Two times a day (BID) | ORAL | Status: DC

## 2015-04-03 MED ORDER — HYDROCODONE-HOMATROPINE 5-1.5 MG/5ML PO SYRP: 5.0000 mL | ORAL_SOLUTION | Freq: Three times a day (TID) | ORAL | Status: DC | PRN

## 2015-04-03 NOTE — Progress Notes (Signed)
Patient: Danielle Rowland Female    DOB: [birth year]   75 y.o.   MRN: [id number] Visit Date: 04/03/2015  Today's Provider: Lelon Huh, MD   Chief Complaint  Patient presents with  . Cough   Subjective:    Cough This is a recurrent problem. Episode onset: 3 weeks ago. The problem has been waxing and waning. Cough characteristics: productive with green- yellow sputum. Associated symptoms include chills, nasal congestion, postnasal drip, rhinorrhea, shortness of breath (during coughing spells) and wheezing (at night). Pertinent negatives include no chest pain, ear congestion, ear pain, fever, headaches, heartburn, hemoptysis, myalgias, rash, sore throat, sweats or weight loss. The symptoms are aggravated by lying down.  Patient was seen 03/23/2015 for Bronchitis and was prescribed a Z pack. Patient states the cough improved after taking the Z pack but didn't completely resolve.  Cough is worse at night. Has flared up the last 5 days.      Allergies  Allergen Reactions  . Influenza Vaccines   . Iodinated Diagnostic Agents     IVP Dye  . Metformin Hcl Nausea Only and Nausea And Vomiting  . Penicillins   . Sulfa Antibiotics    Previous Medications   ALBUTEROL (PROAIR HFA) 108 (90 BASE) MCG/ACT INHALER    Inhale 2 puffs into the lungs as needed. Reported on 03/01/2015   ALPRAZOLAM (XANAX) 0.25 MG TABLET    Take 1 tablet (0.25 mg total) by mouth daily as needed for anxiety.   CHOLECALCIFEROL (VITAMIN D) 1000 UNITS TABLET    Take 1,000 Units by mouth daily. Reported on 03/01/2015   LISINOPRIL-HYDROCHLOROTHIAZIDE (PRINZIDE,ZESTORETIC) 10-12.5 MG PER TABLET    Take 1 tablet by mouth daily.   MONTELUKAST (SINGULAIR) 10 MG TABLET    Take 1 tablet (10 mg total) by mouth at bedtime.   PROMETHAZINE (PHENERGAN) 25 MG TABLET    Take 1 tablet (25 mg total) by mouth every 6 (six) hours as needed. nausea   WARFARIN (COUMADIN) 1 MG TABLET    Take 1 tablet (1 mg total) by mouth daily.     Review of Systems  Constitutional: Positive for chills and fatigue. Negative for fever, weight loss and appetite change.  HENT: Positive for postnasal drip and rhinorrhea. Negative for ear pain and sore throat.   Respiratory: Positive for cough, shortness of breath (during coughing spells) and wheezing (at night). Negative for hemoptysis and chest tightness.   Cardiovascular: Negative for chest pain and palpitations.  Gastrointestinal: Negative for heartburn, nausea, vomiting and abdominal pain.  Musculoskeletal: Negative for myalgias.  Skin: Negative for rash.  Neurological: Negative for dizziness, weakness and headaches.    Social History  Substance Use Topics  . Smoking status: Never Smoker   . Smokeless tobacco: Never Used  . Alcohol Use: No   Objective:   BP 140/78 mmHg  Pulse 74  Temp(Src) 98.5 F (36.9 C) (Oral)  Resp 18  Wt 190 lb (86.183 kg)  SpO2 95%  Physical Exam  General Appearance:    Alert, cooperative, no distress  HENT:   ENT exam normal, no neck nodes or sinus tenderness  Eyes:    PERRL, conjunctiva/corneas clear, EOM's intact       Lungs:     Rare expiratory wheeze, no rales.  respirations unlabored  Heart:    Regular rate and rhythm  Neurologic:   Awake, alert, oriented x 3. No apparent focal neurological           defect.  Assessment & Plan:     1. Cough  - HYDROcodone-homatropine (HYCODAN) 5-1.5 MG/5ML syrup; Take 5 mLs by mouth every 8 (eight) hours as needed for cough.  Dispense: 120 mL; Refill: 0  2. Bronchitis  - doxycycline (VIBRA-TABS) 100 MG tablet; Take 1 tablet (100 mg total) by mouth 2 (two) times daily.  Dispense: 20 tablet; Refill: 0  Call if symptoms change or if not rapidly improving.           Lelon Huh, MD  [facility]

## 2015-04-05 ENCOUNTER — Inpatient Hospital Stay: Payer: Medicare Other | Attending: Internal Medicine

## 2015-04-05 DIAGNOSIS — Z95828 Presence of other vascular implants and grafts: Secondary | ICD-10-CM

## 2015-04-05 DIAGNOSIS — Z85048 Personal history of other malignant neoplasm of rectum, rectosigmoid junction, and anus: Secondary | ICD-10-CM | POA: Diagnosis not present

## 2015-04-05 DIAGNOSIS — Z452 Encounter for adjustment and management of vascular access device: Secondary | ICD-10-CM | POA: Diagnosis not present

## 2015-04-05 MED ORDER — HEPARIN SOD (PORK) LOCK FLUSH 100 UNIT/ML IV SOLN
500.0000 [IU] | Freq: Once | INTRAVENOUS | Status: AC
  Administered 2015-04-05: 500 [IU] via INTRAVENOUS

## 2015-04-05 MED ORDER — SODIUM CHLORIDE 0.9% FLUSH
10.0000 mL | INTRAVENOUS | Status: DC | PRN
  Administered 2015-04-05: 10 mL via INTRAVENOUS
  Filled 2015-04-05: qty 10

## 2015-04-12 ENCOUNTER — Telehealth: Payer: Self-pay | Admitting: *Deleted

## 2015-04-12 NOTE — Telephone Encounter (Signed)
-----   Message from [place] sent at 04/12/2015  3:31 PM EDT ----- Patient wants to know why is doesn't have labs drawn more often. She would like to come in every 4 months just to get them checked. Please return call to patient @ [phone]

## 2015-04-12 NOTE — Telephone Encounter (Signed)
Called Patient back. Explained that at the last visit she was doing very well. Her labs were completely normal at that time. No phlebotomy was needed. She states that "I just worry. I know my body and when I get headaches, I just know my hct counts are going up above 47." I asked patient if she is having any problems at this time or any headaches. She denied any concerns. I explained to the patient that she can always call us back if she has any concerns or new symptoms that arise. She thanked me for returning her call.

## 2015-05-10 ENCOUNTER — Inpatient Hospital Stay: Payer: Medicare Other | Attending: Internal Medicine

## 2015-05-10 DIAGNOSIS — Z452 Encounter for adjustment and management of vascular access device: Secondary | ICD-10-CM | POA: Diagnosis not present

## 2015-05-10 DIAGNOSIS — Z85048 Personal history of other malignant neoplasm of rectum, rectosigmoid junction, and anus: Secondary | ICD-10-CM | POA: Insufficient documentation

## 2015-05-10 DIAGNOSIS — C801 Malignant (primary) neoplasm, unspecified: Secondary | ICD-10-CM

## 2015-05-10 MED ORDER — SODIUM CHLORIDE 0.9% FLUSH
10.0000 mL | Freq: Once | INTRAVENOUS | Status: AC
  Administered 2015-05-10: 10 mL via INTRAVENOUS
  Filled 2015-05-10: qty 10

## 2015-05-10 MED ORDER — HEPARIN SOD (PORK) LOCK FLUSH 100 UNIT/ML IV SOLN
500.0000 [IU] | Freq: Once | INTRAVENOUS | Status: AC
  Administered 2015-05-10: 500 [IU] via INTRAVENOUS
  Filled 2015-05-10: qty 5

## 2015-06-14 ENCOUNTER — Inpatient Hospital Stay: Payer: Medicare Other | Attending: Internal Medicine

## 2015-06-14 DIAGNOSIS — Z452 Encounter for adjustment and management of vascular access device: Secondary | ICD-10-CM | POA: Insufficient documentation

## 2015-06-14 DIAGNOSIS — Z95828 Presence of other vascular implants and grafts: Secondary | ICD-10-CM

## 2015-06-14 DIAGNOSIS — Z85048 Personal history of other malignant neoplasm of rectum, rectosigmoid junction, and anus: Secondary | ICD-10-CM | POA: Diagnosis not present

## 2015-06-14 MED ORDER — SODIUM CHLORIDE 0.9% FLUSH
10.0000 mL | INTRAVENOUS | Status: DC | PRN
  Administered 2015-06-14: 10 mL via INTRAVENOUS
  Filled 2015-06-14: qty 10

## 2015-06-14 MED ORDER — HEPARIN SOD (PORK) LOCK FLUSH 100 UNIT/ML IV SOLN
500.0000 [IU] | Freq: Once | INTRAVENOUS | Status: AC
  Administered 2015-06-14: 500 [IU] via INTRAVENOUS

## 2015-07-03 ENCOUNTER — Encounter: Payer: Self-pay | Admitting: Family Medicine

## 2015-07-03 ENCOUNTER — Ambulatory Visit (INDEPENDENT_AMBULATORY_CARE_PROVIDER_SITE_OTHER): Payer: Medicare Other | Admitting: Family Medicine

## 2015-07-03 VITALS — BP 118/64 | HR 64 | Temp 98.3°F | Resp 16 | Ht 60.0 in | Wt 193.0 lb

## 2015-07-03 DIAGNOSIS — J309 Allergic rhinitis, unspecified: Secondary | ICD-10-CM | POA: Diagnosis not present

## 2015-07-03 DIAGNOSIS — F410 Panic disorder [episodic paroxysmal anxiety] without agoraphobia: Secondary | ICD-10-CM | POA: Diagnosis not present

## 2015-07-03 DIAGNOSIS — E119 Type 2 diabetes mellitus without complications: Secondary | ICD-10-CM

## 2015-07-03 LAB — POCT GLYCOSYLATED HEMOGLOBIN (HGB A1C)
ESTIMATED AVERAGE GLUCOSE: 203
HEMOGLOBIN A1C: 8.7

## 2015-07-03 MED ORDER — PIOGLITAZONE HCL 15 MG PO TABS: 15.0000 mg | ORAL_TABLET | Freq: Every day | ORAL | Status: DC

## 2015-07-03 MED ORDER — ALPRAZOLAM 0.25 MG PO TABS: 0.2500 mg | ORAL_TABLET | Freq: Every day | ORAL | Status: DC | PRN

## 2015-07-03 MED ORDER — MONTELUKAST SODIUM 10 MG PO TABS: 10.0000 mg | ORAL_TABLET | Freq: Every day | ORAL | Status: DC

## 2015-07-03 NOTE — Progress Notes (Signed)
Patient: Danielle Rowland Female    DOB: 1940/07/03   75 y.o.   MRN: [id number] Visit Date: 07/03/2015  Today's Provider: Lelon Huh, MD   Chief Complaint  Patient presents with  . Follow-up  . Diabetes  . Hypertension   Subjective:    HPI     Diabetes Mellitus Type II, Follow-up:   Lab Results  Component Value Date   HGBA1C 8.6 03/23/2015   HGBA1C 8.0* 07/04/2014   Last seen for diabetes 3 months ago.  Management since then includes;advissed to work on diet and exercise. Recheck A1c in 3 months.  She reports good compliance with treatment. She is not having side effects. none Current symptoms include none and have been unchanged. Home blood sugar records: fasting range: normal  Episodes of hypoglycemia? no   Current Insulin Regimen: n/a Most Recent Eye Exam: due Weight trend: stable Prior visit with dietician: no Current diet: well balanced Current exercise: none  ----------------------------------------------------------------------   Hypertension, follow-up:  BP Readings from Last 3 Encounters:  07/03/15 118/64  04/03/15 140/78  03/23/15 140/80    She was last seen for hypertension 3 months ago.  BP at that visit was 134/82. Management since that visit includes; no changes.She reports good compliance with treatment. She is not having side effects. none  She is exercising. She is adherent to low salt diet.   Outside blood pressures are n/a. She is experiencing none.  Patient denies none.   Cardiovascular risk factors include diabetes mellitus.  Use of agents associated with hypertension: none.   ----------------------------------------------------------------------     Allergies  Allergen Reactions  . Influenza Vaccines   . Iodinated Diagnostic Agents     IVP Dye  . Metformin Hcl Nausea Only and Nausea And Vomiting  . Penicillins   . Sulfa Antibiotics    Current Meds  Medication Sig  . albuterol (PROAIR HFA) 108 (90 Base)  MCG/ACT inhaler Inhale 2 puffs into the lungs as needed. Reported on 03/01/2015  . ALPRAZolam (XANAX) 0.25 MG tablet Take 1 tablet (0.25 mg total) by mouth daily as needed for anxiety.  . cholecalciferol (VITAMIN D) 1000 UNITS tablet Take 1,000 Units by mouth daily. Reported on 03/01/2015  . doxycycline (VIBRA-TABS) 100 MG tablet Take 1 tablet (100 mg total) by mouth 2 (two) times daily.  Marland Kitchen HYDROcodone-homatropine (HYCODAN) 5-1.5 MG/5ML syrup Take 5 mLs by mouth every 8 (eight) hours as needed for cough.  Marland Kitchen lisinopril-hydrochlorothiazide (PRINZIDE,ZESTORETIC) 10-12.5 MG per tablet Take 1 tablet by mouth daily.  . montelukast (SINGULAIR) 10 MG tablet Take 1 tablet (10 mg total) by mouth at bedtime.  . promethazine (PHENERGAN) 25 MG tablet Take 1 tablet (25 mg total) by mouth every 6 (six) hours as needed. nausea  . warfarin (COUMADIN) 1 MG tablet Take 1 tablet (1 mg total) by mouth daily.    Review of Systems  Constitutional: Negative for fever, chills, appetite change and fatigue.  Respiratory: Negative for chest tightness and shortness of breath.   Cardiovascular: Negative for chest pain and palpitations.  Gastrointestinal: Negative for nausea, vomiting and abdominal pain.  Neurological: Negative for dizziness and weakness.    Social History  Substance Use Topics  . Smoking status: Never Smoker   . Smokeless tobacco: Never Used  . Alcohol Use: No   Objective:   BP 118/64 mmHg  Pulse 64  Temp(Src) 98.3 F (36.8 C) (Oral)  Resp 16  Ht 5' (1.524 m)  Wt 193 lb (87.544 kg)  BMI 37.69 kg/m2  SpO2 96%  Physical Exam General Appearance:    Alert, cooperative, no distress, obese  Eyes:    PERRL, conjunctiva/corneas clear, EOM's intact       Lungs:     Clear to auscultation bilaterally, respirations unlabored  Heart:    Regular rate and rhythm  Neurologic:   Awake, alert, oriented x 3. No apparent focal neurological           defect.         Results for orders placed or performed  in visit on 07/03/15  POCT glycosylated hemoglobin (Hb A1C)  Result Value Ref Range   Hemoglobin A1C 8.7    Est. average glucose Bld gHb Est-mCnc 203        Assessment & Plan:     1. Type 2 diabetes mellitus without complication, without long-term current use of insulin (HCC) She had severe GI effects from metformin, and is very allergic to sulfa. Will try low dose pioglitazone. Encouraged regular exercise.  - POCT glycosylated hemoglobin (Hb A1C)  2. Allergic rhinitis, unspecified allergic rhinitis type Well controlled.  Needs refill singulair - montelukast (SINGULAIR) 10 MG tablet; Take 1 tablet (10 mg total) by mouth at bedtime.  Dispense: 30 tablet; Refill: 6  3. Panic disorder Alprazolam works well, previously written by Dr. Ma Hillock who she no longer sees. Refill today.   Return in about 4 months (around 11/02/2015).        Lelon Huh, MD  [facility]

## 2015-07-03 NOTE — Patient Instructions (Signed)
Exercise by walking or swimming for 150 minutes per week (or about a half hour per day)

## 2015-07-19 ENCOUNTER — Inpatient Hospital Stay: Payer: Medicare Other | Attending: Family Medicine

## 2015-07-19 ENCOUNTER — Inpatient Hospital Stay: Payer: Medicare Other | Attending: Internal Medicine

## 2015-07-19 DIAGNOSIS — Z95828 Presence of other vascular implants and grafts: Secondary | ICD-10-CM

## 2015-07-19 DIAGNOSIS — Z85048 Personal history of other malignant neoplasm of rectum, rectosigmoid junction, and anus: Secondary | ICD-10-CM | POA: Insufficient documentation

## 2015-07-19 DIAGNOSIS — Z452 Encounter for adjustment and management of vascular access device: Secondary | ICD-10-CM | POA: Insufficient documentation

## 2015-07-19 MED ORDER — SODIUM CHLORIDE 0.9% FLUSH
10.0000 mL | INTRAVENOUS | Status: DC | PRN
  Administered 2015-07-19: 10 mL via INTRAVENOUS
  Filled 2015-07-19: qty 10

## 2015-07-19 MED ORDER — HEPARIN SOD (PORK) LOCK FLUSH 100 UNIT/ML IV SOLN
500.0000 [IU] | Freq: Once | INTRAVENOUS | Status: AC
  Administered 2015-07-19: 500 [IU] via INTRAVENOUS

## 2015-08-23 ENCOUNTER — Inpatient Hospital Stay: Payer: Medicare Other | Attending: Internal Medicine

## 2015-08-23 DIAGNOSIS — Z85048 Personal history of other malignant neoplasm of rectum, rectosigmoid junction, and anus: Secondary | ICD-10-CM | POA: Diagnosis not present

## 2015-08-23 DIAGNOSIS — Z452 Encounter for adjustment and management of vascular access device: Secondary | ICD-10-CM | POA: Diagnosis not present

## 2015-08-23 DIAGNOSIS — C801 Malignant (primary) neoplasm, unspecified: Secondary | ICD-10-CM

## 2015-08-23 MED ORDER — SODIUM CHLORIDE 0.9% FLUSH
10.0000 mL | INTRAVENOUS | Status: DC | PRN
  Administered 2015-08-23: 10 mL via INTRAVENOUS
  Filled 2015-08-23: qty 10

## 2015-08-23 MED ORDER — HEPARIN SOD (PORK) LOCK FLUSH 100 UNIT/ML IV SOLN
500.0000 [IU] | Freq: Once | INTRAVENOUS | Status: AC
  Administered 2015-08-23: 500 [IU] via INTRAVENOUS

## 2015-09-23 ENCOUNTER — Other Ambulatory Visit: Payer: Self-pay | Admitting: Family Medicine

## 2015-09-23 DIAGNOSIS — I10 Essential (primary) hypertension: Secondary | ICD-10-CM

## 2015-09-27 ENCOUNTER — Inpatient Hospital Stay: Payer: Medicare Other | Attending: Internal Medicine

## 2015-09-27 DIAGNOSIS — Z452 Encounter for adjustment and management of vascular access device: Secondary | ICD-10-CM | POA: Insufficient documentation

## 2015-09-27 DIAGNOSIS — Z85048 Personal history of other malignant neoplasm of rectum, rectosigmoid junction, and anus: Secondary | ICD-10-CM | POA: Insufficient documentation

## 2015-09-27 DIAGNOSIS — Z95828 Presence of other vascular implants and grafts: Secondary | ICD-10-CM

## 2015-09-27 MED ORDER — SODIUM CHLORIDE 0.9% FLUSH
10.0000 mL | Freq: Once | INTRAVENOUS | Status: AC
  Administered 2015-09-27: 10 mL via INTRAVENOUS
  Filled 2015-09-27: qty 10

## 2015-09-27 MED ORDER — HEPARIN SOD (PORK) LOCK FLUSH 100 UNIT/ML IV SOLN
500.0000 [IU] | Freq: Once | INTRAVENOUS | Status: AC
  Administered 2015-09-27: 500 [IU] via INTRAVENOUS
  Filled 2015-09-27: qty 5

## 2015-10-19 ENCOUNTER — Other Ambulatory Visit: Payer: Self-pay | Admitting: Family Medicine

## 2015-10-19 DIAGNOSIS — J309 Allergic rhinitis, unspecified: Secondary | ICD-10-CM

## 2015-11-01 ENCOUNTER — Telehealth: Payer: Self-pay | Admitting: Family Medicine

## 2015-11-01 ENCOUNTER — Inpatient Hospital Stay: Payer: Medicare Other | Attending: Internal Medicine

## 2015-11-01 DIAGNOSIS — Z452 Encounter for adjustment and management of vascular access device: Secondary | ICD-10-CM | POA: Diagnosis not present

## 2015-11-01 DIAGNOSIS — Z85048 Personal history of other malignant neoplasm of rectum, rectosigmoid junction, and anus: Secondary | ICD-10-CM | POA: Insufficient documentation

## 2015-11-01 DIAGNOSIS — Z95828 Presence of other vascular implants and grafts: Secondary | ICD-10-CM

## 2015-11-01 MED ORDER — HEPARIN SOD (PORK) LOCK FLUSH 100 UNIT/ML IV SOLN
500.0000 [IU] | Freq: Once | INTRAVENOUS | Status: AC
  Administered 2015-11-01: 500 [IU] via INTRAVENOUS

## 2015-11-01 MED ORDER — SODIUM CHLORIDE 0.9% FLUSH
10.0000 mL | INTRAVENOUS | Status: DC | PRN
  Administered 2015-11-01: 10 mL via INTRAVENOUS
  Filled 2015-11-01: qty 10

## 2015-11-01 NOTE — Telephone Encounter (Signed)
Called Pt to schedule AWV with [facility] for 10/31 - knb

## 2015-11-06 ENCOUNTER — Ambulatory Visit: Payer: Medicare Other | Admitting: Family Medicine

## 2015-11-27 ENCOUNTER — Other Ambulatory Visit: Payer: Self-pay | Admitting: Family Medicine

## 2015-11-29 ENCOUNTER — Other Ambulatory Visit: Payer: Self-pay | Admitting: Family Medicine

## 2015-11-29 NOTE — Telephone Encounter (Signed)
Pt needs refill on the pioglitazone 15mg .  She uses [facility]  Please call pt back after lunch to let her know that this has been sent to the pharmacy [phone]  Thanks Con Memos

## 2015-11-29 NOTE — Telephone Encounter (Signed)
Requested medication was sent to pharmacy 11/27/15. Confirmed by pharmacy.  Patient was notified.

## 2015-11-30 ENCOUNTER — Ambulatory Visit: Payer: Medicare Other | Admitting: Family Medicine

## 2015-12-06 ENCOUNTER — Inpatient Hospital Stay: Payer: Medicare Other | Attending: Internal Medicine

## 2015-12-06 DIAGNOSIS — Z452 Encounter for adjustment and management of vascular access device: Secondary | ICD-10-CM | POA: Diagnosis not present

## 2015-12-06 DIAGNOSIS — Z85048 Personal history of other malignant neoplasm of rectum, rectosigmoid junction, and anus: Secondary | ICD-10-CM | POA: Diagnosis not present

## 2015-12-06 DIAGNOSIS — Z95828 Presence of other vascular implants and grafts: Secondary | ICD-10-CM

## 2015-12-06 MED ORDER — SODIUM CHLORIDE 0.9% FLUSH
10.0000 mL | INTRAVENOUS | Status: DC | PRN
  Administered 2015-12-06: 10 mL via INTRAVENOUS
  Filled 2015-12-06: qty 10

## 2015-12-06 MED ORDER — HEPARIN SOD (PORK) LOCK FLUSH 100 UNIT/ML IV SOLN
500.0000 [IU] | Freq: Once | INTRAVENOUS | Status: AC
  Administered 2015-12-06: 500 [IU] via INTRAVENOUS

## 2015-12-25 ENCOUNTER — Ambulatory Visit (INDEPENDENT_AMBULATORY_CARE_PROVIDER_SITE_OTHER): Payer: Medicare Other | Admitting: Family Medicine

## 2015-12-25 ENCOUNTER — Encounter: Payer: Self-pay | Admitting: Family Medicine

## 2015-12-25 VITALS — BP 122/78 | HR 52 | Temp 98.5°F | Resp 16 | Ht 59.0 in | Wt 203.0 lb

## 2015-12-25 DIAGNOSIS — Z23 Encounter for immunization: Secondary | ICD-10-CM | POA: Diagnosis not present

## 2015-12-25 DIAGNOSIS — E119 Type 2 diabetes mellitus without complications: Secondary | ICD-10-CM | POA: Diagnosis not present

## 2015-12-25 DIAGNOSIS — I10 Essential (primary) hypertension: Secondary | ICD-10-CM

## 2015-12-25 LAB — POCT GLYCOSYLATED HEMOGLOBIN (HGB A1C)
ESTIMATED AVERAGE GLUCOSE: 131
Hemoglobin A1C: 6.2

## 2015-12-25 NOTE — Progress Notes (Signed)
Patient: Danielle Rowland Female    DOB: 1940/03/24   75 y.o.   MRN: [id number] Visit Date: 12/25/2015  Today's Provider: Lelon Huh, MD   Chief Complaint  Patient presents with  . Diabetes  . Hypertension  . Anxiety   Subjective:    HPI  Diabetes Mellitus Type II, Follow-up:   Lab Results  Component Value Date   HGBA1C 6.2 12/25/2015   HGBA1C 8.7 07/03/2015   HGBA1C 8.6 03/23/2015    Last seen for diabetes 6 months ago.  Management since then includes starting low dose Actos. She reports good compliance with treatment. She is not having side effects.  Current symptoms include none and have been stable. Home blood sugar records: fasting range: 120s  Episodes of hypoglycemia? no   Current Insulin Regimen: none Most Recent Eye Exam: 1 year ago.  Weight trend: stable Prior visit with dietician: no Current diet: well balanced Current exercise: walking, but not regularly.   Pertinent Labs:    Component Value Date/Time   CHOL 173 07/04/2014 1115   TRIG 163 (H) 07/04/2014 1115   HDL 38 (L) 07/04/2014 1115   LDLCALC 102 (H) 07/04/2014 1115   CREATININE 1.06 (H) 03/01/2015 1345   CREATININE 1.18 02/16/2014 1430    Wt Readings from Last 3 Encounters:  12/25/15 203 lb (92.1 kg)  07/03/15 193 lb (87.5 kg)  04/03/15 190 lb (86.2 kg)    Hypertension, follow-up:  BP Readings from Last 3 Encounters:  12/25/15 122/78  07/03/15 118/64  04/03/15 140/78    She was last seen for hypertension 9 months ago.  BP at that visit was 134/82. Management since that visit includes no changes. She reports good compliance with treatment. She is not having side effects.  She is exercising. She is adherent to low salt diet.   Outside blood pressures are not being checked. Patient denies chest pressure/discomfort, irregular heart beat, lower extremity edema, syncope and tachypnea.   Cardiovascular risk factors include diabetes mellitus and obesity (BMI >= 30  kg/m2).     Weight trend: stable Wt Readings from Last 3 Encounters:  12/25/15 203 lb (92.1 kg)  07/03/15 193 lb (87.5 kg)  04/03/15 190 lb (86.2 kg)    Current diet: well balanced        Allergies  Allergen Reactions  . Influenza Vaccines   . Iodinated Diagnostic Agents     IVP Dye  . Metformin Hcl Nausea Only and Nausea And Vomiting  . Penicillins   . Sulfa Antibiotics      Current Outpatient Prescriptions:  .  albuterol (PROAIR HFA) 108 (90 Base) MCG/ACT inhaler, Inhale 2 puffs into the lungs as needed. Reported on 03/01/2015, Disp: 18 g, Rfl: 3 .  ALPRAZolam (XANAX) 0.25 MG tablet, Take 1 tablet (0.25 mg total) by mouth daily as needed for anxiety., Disp: 60 tablet, Rfl: 1 .  cholecalciferol (VITAMIN D) 1000 UNITS tablet, Take 1,000 Units by mouth daily. Reported on 03/01/2015, Disp: , Rfl:  .  HYDROcodone-homatropine (HYCODAN) 5-1.5 MG/5ML syrup, Take 5 mLs by mouth every 8 (eight) hours as needed for cough., Disp: 120 mL, Rfl: 0 .  lisinopril-hydrochlorothiazide (PRINZIDE,ZESTORETIC) 10-12.5 MG tablet, TAKE 1 TABLET BY MOUTH DAILY., Disp: 90 tablet, Rfl: 4 .  montelukast (SINGULAIR) 10 MG tablet, TAKE 1 TABLET (10 MG TOTAL) BY MOUTH AT BEDTIME., Disp: 30 tablet, Rfl: 12 .  pioglitazone (ACTOS) 15 MG tablet, Take 1 tablet (15 mg total) by mouth daily., Disp: 30  tablet, Rfl: 12 .  promethazine (PHENERGAN) 25 MG tablet, Take 1 tablet (25 mg total) by mouth every 6 (six) hours as needed. nausea, Disp: 60 tablet, Rfl: 1 .  warfarin (COUMADIN) 1 MG tablet, Take 1 tablet (1 mg total) by mouth daily., Disp: 90 tablet, Rfl: 5 No current facility-administered medications for this visit.   Facility-Administered Medications Ordered in Other Visits:  .  heparin lock flush 100 unit/mL, 500 Units, Intravenous, Once, Evlyn Kanner, NP .  sodium chloride 0.9 % injection 10 mL, 10 mL, Intravenous, PRN, Leia Alf, MD, 10 mL at 06/08/14 1458 .  sodium chloride 0.9 % injection 10  mL, 10 mL, Intravenous, PRN, Leia Alf, MD, 10 mL at 08/17/14 1352 .  sodium chloride 0.9 % injection 10 mL, 10 mL, Intravenous, Once, Evlyn Kanner, NP  Review of Systems  Constitutional: Negative.   Respiratory: Negative.   Cardiovascular: Negative for chest pain, palpitations and leg swelling.  Gastrointestinal: Negative.   Neurological: Negative.     Social History  Substance Use Topics  . Smoking status: Never Smoker  . Smokeless tobacco: Never Used  . Alcohol use No   Objective:   BP 122/78 (BP Location: Right Arm, Patient Position: Sitting, Cuff Size: Large)   Pulse (!) 52   Temp 98.5 F (36.9 C)   Resp 16   Ht 4\' 11"  (1.499 m)   Wt 203 lb (92.1 kg)   SpO2 97%   BMI 41.00 kg/m   Physical Exam  General Appearance:    Alert, cooperative, no distress, obese  Eyes:    PERRL, conjunctiva/corneas clear, EOM's intact       Lungs:     Clear to auscultation bilaterally, respirations unlabored  Heart:    Regular rate and rhythm  Neurologic:   Awake, alert, oriented x 3. No apparent focal neurological           defect.        Results for orders placed or performed in visit on 12/25/15  POCT glycosylated hemoglobin (Hb A1C)  Result Value Ref Range   Hemoglobin A1C 6.2    Est. average glucose Bld gHb Est-mCnc 131        Assessment & Plan:     1. Essential hypertension Well controlled.  Continue current medications.    2. Type 2 diabetes mellitus without complication, without long-term current use of insulin (HCC) Much better on pioglitazone. Continue current medications.   - POCT glycosylated hemoglobin (Hb A1C)  3. Need for pneumococcal vaccination  - Pneumococcal conjugate vaccine 13-valent IM  Return in about 4 months (around 04/24/2016).     The entirety of the information documented in the History of Present Illness, Review of Systems and Physical Exam were personally obtained by me. Portions of this information were initially documented by  Wilburt Finlay, CMA and reviewed by me for thoroughness and accuracy.    Lelon Huh, MD  [facility]

## 2015-12-25 NOTE — Patient Instructions (Signed)
   Please contact your eyecare professional to schedule a routine eye exam  

## 2016-01-10 ENCOUNTER — Inpatient Hospital Stay: Payer: Medicare Other | Attending: Internal Medicine

## 2016-01-10 DIAGNOSIS — Z452 Encounter for adjustment and management of vascular access device: Secondary | ICD-10-CM | POA: Insufficient documentation

## 2016-01-10 DIAGNOSIS — Z85048 Personal history of other malignant neoplasm of rectum, rectosigmoid junction, and anus: Secondary | ICD-10-CM | POA: Diagnosis not present

## 2016-01-10 DIAGNOSIS — Z95828 Presence of other vascular implants and grafts: Secondary | ICD-10-CM

## 2016-01-10 MED ORDER — SODIUM CHLORIDE 0.9% FLUSH
10.0000 mL | INTRAVENOUS | Status: DC | PRN
  Administered 2016-01-10: 10 mL via INTRAVENOUS
  Filled 2016-01-10: qty 10

## 2016-01-10 MED ORDER — HEPARIN SOD (PORK) LOCK FLUSH 100 UNIT/ML IV SOLN
500.0000 [IU] | Freq: Once | INTRAVENOUS | Status: AC
  Administered 2016-01-10: 500 [IU] via INTRAVENOUS

## 2016-02-14 ENCOUNTER — Inpatient Hospital Stay: Payer: Medicare Other | Attending: Internal Medicine

## 2016-02-14 DIAGNOSIS — Z85048 Personal history of other malignant neoplasm of rectum, rectosigmoid junction, and anus: Secondary | ICD-10-CM | POA: Insufficient documentation

## 2016-02-14 DIAGNOSIS — Z95828 Presence of other vascular implants and grafts: Secondary | ICD-10-CM

## 2016-02-14 DIAGNOSIS — Z452 Encounter for adjustment and management of vascular access device: Secondary | ICD-10-CM | POA: Diagnosis not present

## 2016-02-14 MED ORDER — SODIUM CHLORIDE 0.9% FLUSH
10.0000 mL | INTRAVENOUS | Status: DC | PRN
  Administered 2016-02-14: 10 mL via INTRAVENOUS
  Filled 2016-02-14: qty 10

## 2016-02-14 MED ORDER — HEPARIN SOD (PORK) LOCK FLUSH 100 UNIT/ML IV SOLN
500.0000 [IU] | Freq: Once | INTRAVENOUS | Status: AC
  Administered 2016-02-14: 500 [IU] via INTRAVENOUS

## 2016-02-23 ENCOUNTER — Other Ambulatory Visit: Payer: Self-pay | Admitting: Internal Medicine

## 2016-03-06 ENCOUNTER — Ambulatory Visit: Payer: Medicare Other | Admitting: Internal Medicine

## 2016-03-06 ENCOUNTER — Other Ambulatory Visit: Payer: Medicare Other

## 2016-03-20 ENCOUNTER — Inpatient Hospital Stay: Payer: Medicare Other | Attending: Internal Medicine

## 2016-03-20 ENCOUNTER — Ambulatory Visit: Payer: Medicare Other | Admitting: Internal Medicine

## 2016-03-20 DIAGNOSIS — Z95828 Presence of other vascular implants and grafts: Secondary | ICD-10-CM

## 2016-03-20 DIAGNOSIS — Z452 Encounter for adjustment and management of vascular access device: Secondary | ICD-10-CM | POA: Insufficient documentation

## 2016-03-20 DIAGNOSIS — Z85048 Personal history of other malignant neoplasm of rectum, rectosigmoid junction, and anus: Secondary | ICD-10-CM | POA: Diagnosis not present

## 2016-03-20 MED ORDER — HEPARIN SOD (PORK) LOCK FLUSH 100 UNIT/ML IV SOLN
500.0000 [IU] | Freq: Once | INTRAVENOUS | Status: AC
  Administered 2016-03-20: 500 [IU] via INTRAVENOUS

## 2016-03-20 MED ORDER — SODIUM CHLORIDE 0.9% FLUSH
10.0000 mL | INTRAVENOUS | Status: DC | PRN
  Administered 2016-03-20: 10 mL via INTRAVENOUS
  Filled 2016-03-20: qty 10

## 2016-03-25 ENCOUNTER — Encounter: Payer: Medicare Other | Admitting: Family Medicine

## 2016-03-26 ENCOUNTER — Telehealth: Payer: Self-pay | Admitting: Family Medicine

## 2016-03-26 NOTE — Telephone Encounter (Signed)
Called Pt to schedule AWV with [facility] - knb °

## 2016-04-10 ENCOUNTER — Ambulatory Visit: Payer: Medicare Other | Admitting: Internal Medicine

## 2016-04-24 ENCOUNTER — Inpatient Hospital Stay: Payer: Medicare Other | Attending: Internal Medicine | Admitting: Internal Medicine

## 2016-04-24 ENCOUNTER — Inpatient Hospital Stay: Payer: Medicare Other

## 2016-04-24 DIAGNOSIS — C20 Malignant neoplasm of rectum: Secondary | ICD-10-CM

## 2016-04-24 DIAGNOSIS — Z923 Personal history of irradiation: Secondary | ICD-10-CM | POA: Insufficient documentation

## 2016-04-24 DIAGNOSIS — Z79899 Other long term (current) drug therapy: Secondary | ICD-10-CM | POA: Insufficient documentation

## 2016-04-24 DIAGNOSIS — C189 Malignant neoplasm of colon, unspecified: Secondary | ICD-10-CM

## 2016-04-24 DIAGNOSIS — F419 Anxiety disorder, unspecified: Secondary | ICD-10-CM | POA: Diagnosis not present

## 2016-04-24 DIAGNOSIS — Z85048 Personal history of other malignant neoplasm of rectum, rectosigmoid junction, and anus: Secondary | ICD-10-CM | POA: Diagnosis not present

## 2016-04-24 DIAGNOSIS — D72820 Lymphocytosis (symptomatic): Secondary | ICD-10-CM | POA: Diagnosis not present

## 2016-04-24 DIAGNOSIS — Z95828 Presence of other vascular implants and grafts: Secondary | ICD-10-CM

## 2016-04-24 LAB — CBC WITH DIFFERENTIAL/PLATELET
BASOS PCT: 1 %
Basophils Absolute: 0.1 10*3/uL (ref 0–0.1)
Eosinophils Absolute: 0.2 10*3/uL (ref 0–0.7)
Eosinophils Relative: 3 %
HEMATOCRIT: 41.7 % (ref 35.0–47.0)
Hemoglobin: 14.8 g/dL (ref 12.0–16.0)
LYMPHS ABS: 3.5 10*3/uL (ref 1.0–3.6)
Lymphocytes Relative: 47 %
MCH: 32.9 pg (ref 26.0–34.0)
MCHC: 35.6 g/dL (ref 32.0–36.0)
MCV: 92.6 fL (ref 80.0–100.0)
MONO ABS: 1.2 10*3/uL — AB (ref 0.2–0.9)
MONOS PCT: 16 %
NEUTROS ABS: 2.4 10*3/uL (ref 1.4–6.5)
Neutrophils Relative %: 33 %
Platelets: 279 10*3/uL (ref 150–440)
RBC: 4.5 MIL/uL (ref 3.80–5.20)
RDW: 13.4 % (ref 11.5–14.5)
WBC: 7.4 10*3/uL (ref 3.6–11.0)

## 2016-04-24 LAB — COMPREHENSIVE METABOLIC PANEL
ALBUMIN: 3.3 g/dL — AB (ref 3.5–5.0)
ALT: 14 U/L (ref 14–54)
ANION GAP: 7 (ref 5–15)
AST: 17 U/L (ref 15–41)
Alkaline Phosphatase: 49 U/L (ref 38–126)
BILIRUBIN TOTAL: 1.4 mg/dL — AB (ref 0.3–1.2)
BUN: 18 mg/dL (ref 6–20)
CO2: 26 mmol/L (ref 22–32)
Calcium: 8.6 mg/dL — ABNORMAL LOW (ref 8.9–10.3)
Chloride: 101 mmol/L (ref 101–111)
Creatinine, Ser: 1 mg/dL (ref 0.44–1.00)
GFR, EST NON AFRICAN AMERICAN: 54 mL/min — AB (ref >60)
Glucose, Bld: 99 mg/dL (ref 65–99)
POTASSIUM: 3.6 mmol/L (ref 3.5–5.1)
Sodium: 134 mmol/L — ABNORMAL LOW (ref 135–145)
TOTAL PROTEIN: 6.9 g/dL (ref 6.5–8.1)

## 2016-04-24 MED ORDER — PROMETHAZINE HCL 25 MG PO TABS: 25.0000 mg | ORAL_TABLET | Freq: Four times a day (QID) | ORAL | 1 refills | Status: DC | PRN

## 2016-04-24 MED ORDER — SODIUM CHLORIDE 0.9% FLUSH
10.0000 mL | INTRAVENOUS | Status: DC | PRN
  Administered 2016-04-24: 10 mL via INTRAVENOUS
  Filled 2016-04-24: qty 10

## 2016-04-24 MED ORDER — HEPARIN SOD (PORK) LOCK FLUSH 100 UNIT/ML IV SOLN
500.0000 [IU] | Freq: Once | INTRAVENOUS | Status: AC
  Administered 2016-04-24: 500 [IU] via INTRAVENOUS

## 2016-04-24 MED ORDER — ALPRAZOLAM 0.25 MG PO TABS: ORAL_TABLET | ORAL | 0 refills | Status: DC

## 2016-04-24 NOTE — Assessment & Plan Note (Addendum)
#   rectal cancer- with recurrence [2005] left pelvic area- status post palliative radiation. Currently in remission. Patient is likely cured of her rectal cancer.   # mild lymphocytosis/without any absolute leukocytosis- reviewed the labs in detail. Normal hemoglobin and platelets.  # port flushes every 5 weeks as per patient preference.   # anxiety/mild nausea- Xanax prior to port access and Phenergan prescribed.  # She'll follow-up with me in one year; labs. A copy of the labs given  to the patient.

## 2016-04-24 NOTE — Progress Notes (Signed)
Person OFFICE PROGRESS NOTE  Patient Care Team: Birdie Sons, MD as PCP - General (Family Medicine) Cammie Sickle, MD as Consulting Physician (Internal Medicine)   SUMMARY OF ONCOLOGIC HISTORY:  Oncology History   # 2004- Rectal cancer stage II- s/p Sx; Chemo-RT; 5FU finished May 2004. 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   Patient has episodes of anxiety prior to port access. She has been taking Xanax. She has episodes of nausea for which she takes Phenergan. Requests a prescription.  Otherwise denies any unusual abdominal pain nausea vomiting blood in stools or black stools.No unusual shortness of breath and chest pain.  REVIEW OF SYSTEMS:  A complete 10 point review of system is done which is negative except mentioned above/history of present illness.   PAST MEDICAL HISTORY :  Past Medical History:  Diagnosis Date  . Erythrocytosis 06/05/2014  . History of radiation therapy   . Leukocytosis 06/05/2014  . Rectal cancer ([place]) 05/2002    PAST SURGICAL HISTORY :   Past Surgical History:  Procedure Laterality Date  . CHOLECYSTECTOMY     Laporoscopic  . COLON RESECTION     Anterior  . COLONOSCOPY  08/02/2002   Dr. Faylene Million  . CT Scan of Abdomen  03/23/2013   SBO due to bowel entering Large ventral hernia. 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 DG  BONE DENSITY ([facility] HX)  01/2011   Osteopenia  . LAPAROSCOPIC SALPINGO OOPHERECTOMY Left     FAMILY HISTORY :   Family History  Problem Relation Age of Onset  . Stroke Mother    . CAD Mother   . Lung cancer Father   . Prostate cancer Father   . Diabetes Sister   . Hypertension Sister   . Hypothyroidism Sister   . CAD Sister   . Diabetes Sister   . Hypertension Sister   . Hypothyroidism Sister   . Transient ischemic attack Sister   . Hypertension Sister   . Heart disease Other   . Hypertension Sister     SOCIAL HISTORY:   Social History  Substance Use Topics  . Smoking status: Never Smoker  . Smokeless tobacco: Never Used  . Alcohol use No    ALLERGIES:  is allergic to influenza vaccines; iodinated diagnostic agents; metformin hcl; penicillins; and sulfa antibiotics.  MEDICATIONS:  Current Outpatient Prescriptions  Medication Sig Dispense Refill  . ALPRAZolam (XANAX) 0.25 MG tablet Take one tablet as needed prior to port access 30 tablet 0  . lisinopril-hydrochlorothiazide (PRINZIDE,ZESTORETIC) 10-12.5 MG tablet TAKE 1 TABLET BY MOUTH DAILY. 90 tablet 4  . pioglitazone (ACTOS) 15 MG tablet Take 1 tablet (15 mg total) by mouth daily. 30 tablet 12  . promethazine (PHENERGAN) 25 MG tablet Take 1 tablet (25 mg total) by mouth every 6 (six) hours as needed. nausea 60 tablet 1  . warfarin (COUMADIN) 1 MG tablet TAKE 1 TABLET (1 MG TOTAL) BY MOUTH DAILY. 90 tablet 3   No current facility-administered medications for this visit.    Facility-Administered Medications Ordered in Other Visits  Medication Dose Route Frequency Provider  Last Rate Last Dose  . heparin lock flush 100 unit/mL  500 Units Intravenous Once Evlyn Kanner, NP      . sodium chloride 0.9 % injection 10 mL  10 mL Intravenous PRN Leia Alf, MD   10 mL at 06/08/14 1458  . sodium chloride 0.9 % injection 10 mL  10 mL Intravenous PRN Leia Alf, MD   10 mL at 08/17/14 1352  . sodium chloride 0.9 % injection 10 mL  10 mL Intravenous Once Evlyn Kanner, NP        PHYSICAL EXAMINATION:   BP (!) 144/83 (BP Location: Right Arm, Patient Position: Sitting)   Pulse (!) 58   Temp  (!) 96.8 F (36 C) (Tympanic)   Resp 18   Wt 208 lb 4 oz (94.5 kg)   BMI 42.06 kg/m   Filed Weights   04/24/16 1405  Weight: 208 lb 4 oz (94.5 kg)    GENERAL: Well-nourished well-developed; Alert, no distress and comfortable. She is alone.   EYES: no pallor or icterus OROPHARYNX: no thrush or ulceration; good dentition  NECK: supple, no masses felt LYMPH:  no palpable lymphadenopathy in the cervical, axillary or inguinal regions LUNGS: clear to auscultation and  No wheeze or crackles HEART/CVS: regular rate & rhythm and no murmurs; No lower extremity edema ABDOMEN:abdomen soft, non-tender and normal bowel sounds Musculoskeletal:no cyanosis of digits and no clubbing  PSYCH: alert & oriented x 3 with fluent speech NEURO: no focal motor/sensory deficits SKIN:  no rashes or significant lesions; Mediport in place. No signs of infection.  LABORATORY DATA:  I have reviewed the data as listed    Component Value Date/Time   NA 134 (L) 04/24/2016 1323   NA 141 07/04/2014 1115   NA 139 03/17/2013 1502   K 3.6 04/24/2016 1323   K 4.1 03/23/2013 1254   CL 101 04/24/2016 1323   CL 106 03/17/2013 1502   CO2 26 04/24/2016 1323   CO2 25 03/17/2013 1502   GLUCOSE 99 04/24/2016 1323   GLUCOSE 125 (H) 03/17/2013 1502   BUN 18 04/24/2016 1323   BUN 20 07/04/2014 1115   BUN 19 (H) 03/17/2013 1502   CREATININE 1.00 04/24/2016 1323   CREATININE 1.18 02/16/2014 1430   CALCIUM 8.6 (L) 04/24/2016 1323   CALCIUM 7.4 (L) 03/17/2013 1502   PROT 6.9 04/24/2016 1323   PROT 7.2 02/16/2014 1430   ALBUMIN 3.3 (L) 04/24/2016 1323   ALBUMIN 3.6 07/04/2014 1115   ALBUMIN 3.0 (L) 02/16/2014 1430   AST 17 04/24/2016 1323   AST 16 02/16/2014 1430   ALT 14 04/24/2016 1323   ALT 21 02/16/2014 1430   ALKPHOS 49 04/24/2016 1323   ALKPHOS 74 02/16/2014 1430   BILITOT 1.4 (H) 04/24/2016 1323   BILITOT 0.9 02/16/2014 1430   GFRNONAA 54 (L) 04/24/2016 1323   GFRNONAA 48 (L) 02/16/2014 1430   GFRNONAA  48 (L) 03/17/2013 1502   GFRAA >60 04/24/2016 1323   GFRAA 58 (L) 02/16/2014 1430   GFRAA 55 (L) 03/17/2013 1502    No results found for: SPEP, UPEP  Lab Results  Component Value Date   WBC 7.4 04/24/2016   NEUTROABS 2.4 04/24/2016   HGB 14.8 04/24/2016   HCT 41.7 04/24/2016   MCV 92.6 04/24/2016   PLT 279 04/24/2016      Chemistry      Component Value Date/Time   NA 134 (L) 04/24/2016 1323   NA 141 07/04/2014 1115   NA  139 03/17/2013 1502   K 3.6 04/24/2016 1323   K 4.1 03/23/2013 1254   CL 101 04/24/2016 1323   CL 106 03/17/2013 1502   CO2 26 04/24/2016 1323   CO2 25 03/17/2013 1502   BUN 18 04/24/2016 1323   BUN 20 07/04/2014 1115   BUN 19 (H) 03/17/2013 1502   CREATININE 1.00 04/24/2016 1323   CREATININE 1.18 02/16/2014 1430      Component Value Date/Time   CALCIUM 8.6 (L) 04/24/2016 1323   CALCIUM 7.4 (L) 03/17/2013 1502   ALKPHOS 49 04/24/2016 1323   ALKPHOS 74 02/16/2014 1430   AST 17 04/24/2016 1323   AST 16 02/16/2014 1430   ALT 14 04/24/2016 1323   ALT 21 02/16/2014 1430   BILITOT 1.4 (H) 04/24/2016 1323   BILITOT 0.9 02/16/2014 1430       RADIOGRAPHIC STUDIES: I have personally reviewed the radiological images as listed and agreed with the findings in the report. No results found.   ASSESSMENT & PLAN:   Rectal cancer ([place]) # rectal cancer- with recurrence [2005] left pelvic area- status post palliative radiation. Currently in remission. Patient is likely cured of her rectal cancer.   # mild lymphocytosis/without any absolute leukocytosis- reviewed the labs in detail. Normal hemoglobin and platelets.  # port flushes every 5 weeks as per patient preference.   # anxiety/mild nausea- Xanax prior to port access and Phenergan prescribed.  # She'll follow-up with me in one year; labs. A copy of the labs given  to the patient.        Cammie Sickle, MD 04/24/2016 4:48 PM

## 2016-04-24 NOTE — Progress Notes (Signed)
Patient here today for follow up.  Patient states no new concerns today  

## 2016-04-25 LAB — CEA: CEA: 2.2 ng/mL (ref 0.0–4.7)

## 2016-04-29 ENCOUNTER — Ambulatory Visit (INDEPENDENT_AMBULATORY_CARE_PROVIDER_SITE_OTHER): Payer: Medicare Other | Admitting: Family Medicine

## 2016-04-29 ENCOUNTER — Encounter: Payer: Self-pay | Admitting: Family Medicine

## 2016-04-29 ENCOUNTER — Ambulatory Visit: Payer: Medicare Other | Admitting: Family Medicine

## 2016-04-29 VITALS — BP 160/70 | HR 59 | Temp 97.8°F | Resp 16 | Ht <= 58 in | Wt 211.0 lb

## 2016-04-29 DIAGNOSIS — Z0000 Encounter for general adult medical examination without abnormal findings: Secondary | ICD-10-CM | POA: Diagnosis not present

## 2016-04-29 DIAGNOSIS — M8580 Other specified disorders of bone density and structure, unspecified site: Secondary | ICD-10-CM

## 2016-04-29 DIAGNOSIS — E669 Obesity, unspecified: Secondary | ICD-10-CM

## 2016-04-29 DIAGNOSIS — IMO0001 Reserved for inherently not codable concepts without codable children: Secondary | ICD-10-CM

## 2016-04-29 DIAGNOSIS — E559 Vitamin D deficiency, unspecified: Secondary | ICD-10-CM | POA: Diagnosis not present

## 2016-04-29 DIAGNOSIS — I10 Essential (primary) hypertension: Secondary | ICD-10-CM

## 2016-04-29 DIAGNOSIS — E119 Type 2 diabetes mellitus without complications: Secondary | ICD-10-CM

## 2016-04-29 DIAGNOSIS — Z6841 Body Mass Index (BMI) 40.0 and over, adult: Secondary | ICD-10-CM | POA: Diagnosis not present

## 2016-04-29 NOTE — Patient Instructions (Signed)
Preventive Care 75 Years and Older, Female Preventive care refers to lifestyle choices and visits with your health care provider that can promote health and wellness. What does preventive care include?  A yearly physical exam. This is also called an annual well check.  Dental exams once or twice a year.  Routine eye exams. Ask your health care provider how often you should have your eyes checked.  Personal lifestyle choices, including:  Daily care of your teeth and gums.  Regular physical activity.  Eating a healthy diet.  Avoiding tobacco and drug use.  Limiting alcohol use.  Practicing safe sex.  Taking low-dose aspirin every day.  Taking vitamin and mineral supplements as recommended by your health care provider. What happens during an annual well check? The services and screenings done by your health care provider during your annual well check will depend on your age, overall health, lifestyle risk factors, and family history of disease. Counseling  Your health care provider may ask you questions about your:  Alcohol use.  Tobacco use.  Drug use.  Emotional well-being.  Home and relationship well-being.  Sexual activity.  Eating habits.  History of falls.  Memory and ability to understand (cognition).  Work and work environment.  Reproductive health. Screening  You may have the following tests or measurements:  Height, weight, and BMI.  Blood pressure.  Lipid and cholesterol levels. These may be checked every 5 years, or more frequently if you are over 50 years old.  Skin check.  Lung cancer screening. You may have this screening every year starting at age 55 if you have a 30-pack-year history of smoking and currently smoke or have quit within the past 15 years.  Fecal occult blood test (FOBT) of the stool. You may have this test every year starting at age 50.  Flexible sigmoidoscopy or colonoscopy. You may have a sigmoidoscopy every 5 years or  a colonoscopy every 10 years starting at age 50.  Hepatitis C blood test.  Hepatitis B blood test.  Sexually transmitted disease (STD) testing.  Diabetes screening. This is done by checking your blood sugar (glucose) after you have not eaten for a while (fasting). You may have this done every 1-3 years.  Bone density scan. This is done to screen for osteoporosis. You may have this done starting at age 75.  Mammogram. This may be done every 1-2 years. Talk to your health care provider about how often you should have regular mammograms. Talk with your health care provider about your test results, treatment options, and if necessary, the need for more tests. Vaccines  Your health care provider may recommend certain vaccines, such as:  Influenza vaccine. This is recommended every year.  Tetanus, diphtheria, and acellular pertussis (Tdap, Td) vaccine. You may need a Td booster every 10 years.  Varicella vaccine. You may need this if you have not been vaccinated.  Zoster vaccine. You may need this after age 60.  Measles, mumps, and rubella (MMR) vaccine. You may need at least one dose of MMR if you were born in 1957 or later. You may also need a second dose.  Pneumococcal 13-valent conjugate (PCV13) vaccine. One dose is recommended after age 75.  Pneumococcal polysaccharide (PPSV23) vaccine. One dose is recommended after age 75.  Meningococcal vaccine. You may need this if you have certain conditions.  Hepatitis A vaccine. You may need this if you have certain conditions or if you travel or work in places where you may be exposed to   hepatitis A.  Hepatitis B vaccine. You may need this if you have certain conditions or if you travel or work in places where you may be exposed to hepatitis B.  Haemophilus influenzae type b (Hib) vaccine. You may need this if you have certain conditions. Talk to your health care provider about which screenings and vaccines you need and how often you need  them. This information is not intended to replace advice given to you by your health care provider. Make sure you discuss any questions you have with your health care provider. Document Released: 01/27/2015 Document Revised: 09/20/2015 Document Reviewed: 11/01/2014 Elsevier Interactive Patient Education  2017 [employer] Inc[employer]  

## 2016-04-29 NOTE — Progress Notes (Signed)
Patient: Danielle Rowland, Female    DOB: [birth year], 76 y.o.   MRN: [id number] Visit Date: 04/29/2016  Today's Provider: Lelon Huh, MD   Chief Complaint  Patient presents with  . Annual Exam  . Hypertension    follow up  . Diabetes    follow up  . Hyperlipidemia    follow up   Subjective:    Annual wellness visit Danielle Rowland is a 76 y.o. female. She feels fairly well. She reports sometimes exercising. She reports she is sleeping fairly well.  -----------------------------------------------------------  Hypertension, follow-up:  BP Readings from Last 3 Encounters:  04/24/16 (!) 144/83  12/25/15 122/78  07/03/15 118/64    She was last seen for hypertension 4 months ago.  BP at that visit was 122/78. Management since that visit includes no changes. She reports good compliance with treatment. She is not having side effects.  She is exercising. She is adherent to low salt diet.   Outside blood pressures are not being checked. She is experiencing none.  Patient denies chest pain, chest pressure/discomfort, claudication, dyspnea, exertional chest pressure/discomfort, fatigue, irregular heart beat, lower extremity edema, near-syncope, orthopnea, palpitations, paroxysmal nocturnal dyspnea, syncope and tachypnea.   Cardiovascular risk factors include advanced age (older than 69 for men, 32 for women), diabetes mellitus, hypertension and obesity (BMI >= 30 kg/m2).  Use of agents associated with hypertension: none.     Weight trend: fluctuating a bit Wt Readings from Last 3 Encounters:  04/24/16 208 lb 4 oz (94.5 kg)  12/25/15 203 lb (92.1 kg)  07/03/15 193 lb (87.5 kg)    Current diet: in general, a "healthy" diet    ------------------------------------------------------------------------  Diabetes Mellitus Type II, Follow-up:   Lab Results  Component Value Date   HGBA1C 6.2 12/25/2015   HGBA1C 8.7 07/03/2015   HGBA1C 8.6 03/23/2015    Last  seen for diabetes 4 months ago.  Management since then includes no changes. She reports good compliance with treatment. She is not having side effects.  Current symptoms include none and have been stable. Home blood sugar records: fasting range: 120's  Episodes of hypoglycemia? yes - occasionally   Current Insulin Regimen: none Most Recent Eye Exam: >1 year ago Weight trend: fluctuating a bit Prior visit with dietician: no Current diet: in general, a "healthy" diet   Current exercise: walking  Pertinent Labs:    Component Value Date/Time   CHOL 173 07/04/2014 1115   TRIG 163 (H) 07/04/2014 1115   HDL 38 (L) 07/04/2014 1115   LDLCALC 102 (H) 07/04/2014 1115   CREATININE 1.00 04/24/2016 1323   CREATININE 1.18 02/16/2014 1430    Wt Readings from Last 3 Encounters:  04/24/16 208 lb 4 oz (94.5 kg)  12/25/15 203 lb (92.1 kg)  07/03/15 193 lb (87.5 kg)    ------------------------------------------------------------------------ Follow up of Osteopenia:   Patient was last seen for this problem 2 year ago. Management during that visit includes ordering a BMD.    Lipid/Cholesterol, Follow-up:   Last seen for this2 years ago.  Management changes since that visit include none. . Last Lipid Panel:    Component Value Date/Time   CHOL 173 07/04/2014 1115   TRIG 163 (H) 07/04/2014 1115   HDL 38 (L) 07/04/2014 1115   CHOLHDL 4.6 (H) 07/04/2014 1115   LDLCALC 102 (H) 07/04/2014 1115    Risk factors for vascular disease include diabetes mellitus, hypercholesterolemia and hypertension  She reports good compliance with treatment.  She is not having side effects.  Current symptoms include none and have been stable. Weight trend: fluctuating a bit Prior visit with dietician: no Current diet: in general, a "healthy" diet   Current exercise: walking  Wt Readings from Last 3 Encounters:  04/24/16 208 lb 4 oz (94.5 kg)  12/25/15 203 lb (92.1 kg)  07/03/15 193 lb (87.5 kg)     ------------------------------------------------------------------- Follow up of Vitamin D Deficiency:  Patient was last seen for this problem 2 year ago. Changes made includes increasing Vitamin D to 2,000 units daily. Patient has not been taking any Vitamin D supplements.   Follow up of Panic Disorder:  Patient was last seen for this problem 2 years ago and no changes were made. Patient reports good compliance with treatment, good tolerance and good symptom control.   Review of Systems  Constitutional: Negative for chills, fatigue and fever.  HENT: Negative for congestion, ear pain, rhinorrhea, sneezing and sore throat.   Eyes: Negative.  Negative for pain and redness.  Respiratory: Negative for cough, shortness of breath and wheezing.   Cardiovascular: Negative for chest pain and leg swelling.  Gastrointestinal: Negative for abdominal pain, blood in stool, constipation, diarrhea and nausea.  Endocrine: Negative for polydipsia and polyphagia.  Genitourinary: Negative.  Negative for dysuria, flank pain, hematuria, pelvic pain, vaginal bleeding and vaginal discharge.  Musculoskeletal: Negative for arthralgias, back pain, gait problem and joint swelling.  Skin: Negative for rash.  Neurological: Negative.  Negative for dizziness, tremors, seizures, weakness, light-headedness, numbness and headaches.  Hematological: Negative for adenopathy.  Psychiatric/Behavioral: Negative.  Negative for behavioral problems, confusion and dysphoric mood. The patient is not nervous/anxious and is not hyperactive.     Social History   Social History  . Marital status: Divorced    Spouse name: N/A  . Number of children: 1  . Years of education: H/S   Occupational History  . Florist     Part-time   Social History Main Topics  . Smoking status: Never Smoker  . Smokeless tobacco: Never Used  . Alcohol use No  . Drug use: No  . Sexual activity: Not on file   Other Topics Concern  . Not on file    Social History Narrative  . No narrative on file    Past Medical History:  Diagnosis Date  . Erythrocytosis 06/05/2014  . History of radiation therapy   . Leukocytosis 06/05/2014  . Rectal cancer ([place]) 05/2002     Patient Active Problem List   Diagnosis Date Noted  . Rectal cancer ([place]) 04/24/2016  . Panic disorder 07/03/2015  . Allergic rhinitis 03/23/2015  . Hypertension 07/04/2014  . Hypercholesteremia 07/04/2014  . Osteopenia 07/04/2014  . Vitamin D deficiency 07/04/2014  . Anxiety 07/04/2014  . Chronic lymphatic leukemia ([place]) 07/04/2014  . Diabetes ([place]) 07/04/2014  . Diverticulitis 07/04/2014  . H/O malignant neoplasm of colon 07/04/2014  . Adiposity 07/04/2014  . Hernia of anterior abdominal wall 03/23/2013    Past Surgical History:  Procedure Laterality Date  . CHOLECYSTECTOMY     Laporoscopic  . COLON RESECTION     Anterior  . COLONOSCOPY  08/02/2002   Dr. Faylene Million  . CT Scan of Abdomen  03/23/2013   SBO due to bowel entering Large ventral hernia. 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 DG  BONE DENSITY ([facility] HX)  01/2011   Osteopenia  . LAPAROSCOPIC SALPINGO OOPHERECTOMY Left     Her family history includes CAD in her  mother and sister; Diabetes in her sister and sister; Heart disease in her other; Hypertension in her sister, sister, sister, and sister; Hypothyroidism in her sister and sister; Lung cancer in her father; Prostate cancer in her father; Stroke in her mother; Transient ischemic attack in her sister.      Current Outpatient Prescriptions:  .  ALPRAZolam (XANAX) 0.25 MG tablet, Take one tablet as needed prior to port access, Disp: 30 tablet, Rfl: 0 .  lisinopril-hydrochlorothiazide (PRINZIDE,ZESTORETIC) 10-12.5 MG tablet, TAKE 1 TABLET BY MOUTH DAILY., Disp: 90 tablet, Rfl: 4 .  pioglitazone (ACTOS) 15 MG tablet, Take 1 tablet (15 mg total) by mouth daily., Disp: 30 tablet, Rfl: 12 .  promethazine (PHENERGAN) 25 MG tablet,  Take 1 tablet (25 mg total) by mouth every 6 (six) hours as needed. nausea, Disp: 60 tablet, Rfl: 1 .  warfarin (COUMADIN) 1 MG tablet, TAKE 1 TABLET (1 MG TOTAL) BY MOUTH DAILY., Disp: 90 tablet, Rfl: 3 No current facility-administered medications for this visit.   Facility-Administered Medications Ordered in Other Visits:  .  heparin lock flush 100 unit/mL, 500 Units, Intravenous, Once, Evlyn Kanner, NP .  sodium chloride 0.9 % injection 10 mL, 10 mL, Intravenous, PRN, Leia Alf, MD, 10 mL at 06/08/14 1458 .  sodium chloride 0.9 % injection 10 mL, 10 mL, Intravenous, PRN, Leia Alf, MD, 10 mL at 08/17/14 1352 .  sodium chloride 0.9 % injection 10 mL, 10 mL, Intravenous, Once, Evlyn Kanner, NP  Patient Care Team: Birdie Sons, MD as PCP - General (Family Medicine) Cammie Sickle, MD as Consulting Physician (Internal Medicine)     Objective:   Vitals: BP (!) 160/70 (BP Location: Right Arm, Patient Position: Sitting, Cuff Size: Large)   Pulse (!) 59   Temp 97.8 F (36.6 C) (Oral)   Resp 16   Ht 4' 8.5" (1.435 m)   Wt 211 lb (95.7 kg)   SpO2 97% Comment: room air  BMI 46.47 kg/m   Physical Exam   General Appearance:    Alert, cooperative, no distress, appears stated age  Head:    Normocephalic, without obvious abnormality, atraumatic  Eyes:    PERRL, conjunctiva/corneas clear, EOM's intact, fundi    benign, both eyes  Ears:    Normal TM's and external ear canals, both ears  Nose:   Nares normal, septum midline, mucosa normal, no drainage    or sinus tenderness  Throat:   Lips, mucosa, and tongue normal; teeth and gums normal  Neck:   Supple, symmetrical, trachea midline, no adenopathy;    thyroid:  no enlargement/tenderness/nodules; no carotid   bruit or JVD  Back:     Symmetric, no curvature, ROM normal, no CVA tenderness  Lungs:     Clear to auscultation bilaterally, respirations unlabored  Chest Wall:    No tenderness or deformity   Heart:     Regular rate and rhythm, S1 and S2 normal, no murmur, rub   or gallop  Breast Exam:    normal appearance, no masses or tenderness  Abdomen:     Soft, non-tender, bowel sounds active all four quadrants,    no masses, no organomegaly  Pelvic:    deferred  Extremities:   Extremities normal, atraumatic, no cyanosis or edema  Pulses:   2+ and symmetric all extremities  Skin:   Skin color, texture, turgor normal, no rashes or lesions  Lymph nodes:   Cervical, supraclavicular, and axillary nodes normal  Neurologic:  CNII-XII intact, normal strength, sensation and reflexes    throughout    Activities of Daily Living In your present state of health, do you have any difficulty performing the following activities: 04/29/2016  Hearing? N  Vision? N  Difficulty concentrating or making decisions? N  Walking or climbing stairs? N  Dressing or bathing? N  Doing errands, shopping? N  Some recent data might be hidden    Fall Risk Assessment Fall Risk  04/29/2016 07/04/2014  Falls in the past year? No No     Depression Screen PHQ 2/9 Scores 04/29/2016 07/04/2014  PHQ - 2 Score 0 0  PHQ- 9 Score 3 -    Cognitive Testing - 6-CIT  Correct? Score   What year is it? yes 0 0 or 4  What month is it? yes 0 0 or 3  Memorize:    Pia Mau,  43,  [place],      What time is it? (within 1 hour) yes 0 0 or 3  Count backwards from 20 yes 0 0, 2, or 4  Name the months of the year yes 0 0, 2, or 4  Repeat name & address above no 4 0, 2, 4, 6, 8, or 10       TOTAL SCORE  4/28   Interpretation:  Normal  Normal (0-7) Abnormal (8-28)    Audit-C Alcohol Use Screening  Question Answer Points  How often do you have alcoholic drink? never 0  On days you do drink alcohol, how many drinks do you typically consume? n/a 0  How oftey will you drink 6 or more in a total? never 0  Total Score:  0   A score of 3 or more in women, and 4 or more in men indicates increased risk for alcohol abuse,  EXCEPT if all of the points are from question 1.  Current Exercise Habits: Home exercise routine, Type of exercise: walking, Time (Minutes): 15, Frequency (Times/Week): 5, Weekly Exercise (Minutes/Week): 75, Intensity: Mild Exercise limited by: None identified    Assessment & Plan:     Annual Wellness Visit  Reviewed patient's Family Medical History Reviewed and updated list of patient's medical providers Assessment of cognitive impairment was done Assessed patient's functional ability Established a written schedule for health screening Centre Completed and Reviewed  Exercise Activities and Dietary recommendations Goals    None      Immunization History  Administered Date(s) Administered  . Pneumococcal Conjugate-13 12/25/2015  . Pneumococcal Polysaccharide-23 05/07/2011    Health Maintenance  Topic Date Due  . FOOT EXAM  05/07/1951  . OPHTHALMOLOGY EXAM  05/07/1951  . TETANUS/TDAP  05/06/1960  . MAMMOGRAM  05/07/1991  . COLONOSCOPY  08/01/2012  . INFLUENZA VACCINE  09/13/2016 (Originally 08/14/2016)  . HEMOGLOBIN A1C  06/24/2016  . DEXA SCAN  Completed  . PNA vac Low Risk Adult  Completed     Discussed health benefits of physical activity, and encouraged her to engage in regular exercise appropriate for her age and condition.    ------------------------------------------------------------------------------------------------------------  1. Annual physical exam   2. Essential hypertension Bp not at goal today. Consider increasing lisinopril-hctz after reviewing labs.  - EKG 12-Lead  3. Type 2 diabetes mellitus without complication, without long-term current use of insulin (HCC)  - Lipid panel - Hemoglobin A1c - EKG 12-Lead  4. Osteopenia, unspecified location BMD next year.,   5. Vitamin D deficiency  - VITAMIN D 25 Hydroxy (Vit-D Deficiency,  Fractures)  6. Class 3 obesity with serious comorbidity and body mass index (BMI) of  45.0 to 49.9 in adult, unspecified obesity type (HCC)     Lelon Huh, MD  [facility]

## 2016-05-08 DIAGNOSIS — E559 Vitamin D deficiency, unspecified: Secondary | ICD-10-CM | POA: Diagnosis not present

## 2016-05-08 DIAGNOSIS — E119 Type 2 diabetes mellitus without complications: Secondary | ICD-10-CM | POA: Diagnosis not present

## 2016-05-09 ENCOUNTER — Other Ambulatory Visit: Payer: Self-pay

## 2016-05-09 LAB — LIPID PANEL
CHOLESTEROL TOTAL: 188 mg/dL (ref 100–199)
Chol/HDL Ratio: 4.6 ratio — ABNORMAL HIGH (ref 0.0–4.4)
HDL: 41 mg/dL (ref >39)
LDL CALC: 113 mg/dL — AB (ref 0–99)
Triglycerides: 171 mg/dL — ABNORMAL HIGH (ref 0–149)
VLDL CHOLESTEROL CAL: 34 mg/dL (ref 5–40)

## 2016-05-09 LAB — VITAMIN D 25 HYDROXY (VIT D DEFICIENCY, FRACTURES): Vit D, 25-Hydroxy: 7.9 ng/mL — ABNORMAL LOW (ref 30.0–100.0)

## 2016-05-09 LAB — HEMOGLOBIN A1C
ESTIMATED AVERAGE GLUCOSE: 128 mg/dL
Hgb A1c MFr Bld: 6.1 % — ABNORMAL HIGH (ref 4.8–5.6)

## 2016-05-09 MED ORDER — VITAMIN D (ERGOCALCIFEROL) 1.25 MG (50000 UNIT) PO CAPS: 50000.0000 [IU] | ORAL_CAPSULE | ORAL | 0 refills | Status: DC

## 2016-05-09 NOTE — Progress Notes (Unsigned)
d 

## 2016-05-29 ENCOUNTER — Inpatient Hospital Stay: Payer: Medicare Other | Attending: Internal Medicine

## 2016-05-29 DIAGNOSIS — Z85048 Personal history of other malignant neoplasm of rectum, rectosigmoid junction, and anus: Secondary | ICD-10-CM | POA: Insufficient documentation

## 2016-05-29 DIAGNOSIS — Z452 Encounter for adjustment and management of vascular access device: Secondary | ICD-10-CM | POA: Insufficient documentation

## 2016-05-29 DIAGNOSIS — Z95828 Presence of other vascular implants and grafts: Secondary | ICD-10-CM

## 2016-05-29 DIAGNOSIS — Z923 Personal history of irradiation: Secondary | ICD-10-CM | POA: Insufficient documentation

## 2016-05-29 MED ORDER — HEPARIN SOD (PORK) LOCK FLUSH 100 UNIT/ML IV SOLN
500.0000 [IU] | Freq: Once | INTRAVENOUS | Status: AC
Start: 2016-05-29 — End: 2016-05-29
  Administered 2016-05-29: 500 [IU] via INTRAVENOUS

## 2016-05-29 MED ORDER — SODIUM CHLORIDE 0.9% FLUSH
10.0000 mL | INTRAVENOUS | Status: DC | PRN
  Administered 2016-05-29: 10 mL via INTRAVENOUS
  Filled 2016-05-29: qty 10

## 2016-07-03 ENCOUNTER — Inpatient Hospital Stay: Payer: Medicare Other | Attending: Internal Medicine

## 2016-07-03 DIAGNOSIS — Z85048 Personal history of other malignant neoplasm of rectum, rectosigmoid junction, and anus: Secondary | ICD-10-CM | POA: Diagnosis not present

## 2016-07-03 DIAGNOSIS — Z452 Encounter for adjustment and management of vascular access device: Secondary | ICD-10-CM | POA: Diagnosis not present

## 2016-07-03 DIAGNOSIS — Z95828 Presence of other vascular implants and grafts: Secondary | ICD-10-CM

## 2016-07-03 MED ORDER — HEPARIN SOD (PORK) LOCK FLUSH 100 UNIT/ML IV SOLN
500.0000 [IU] | Freq: Once | INTRAVENOUS | Status: AC
  Administered 2016-07-03: 500 [IU] via INTRAVENOUS

## 2016-07-03 MED ORDER — SODIUM CHLORIDE 0.9% FLUSH
10.0000 mL | INTRAVENOUS | Status: DC | PRN
Start: 2016-07-03 — End: 2016-07-03
  Administered 2016-07-03: 10 mL via INTRAVENOUS
  Filled 2016-07-03: qty 10

## 2016-07-22 ENCOUNTER — Ambulatory Visit (INDEPENDENT_AMBULATORY_CARE_PROVIDER_SITE_OTHER): Payer: Medicare Other | Admitting: Family Medicine

## 2016-07-22 ENCOUNTER — Encounter: Payer: Self-pay | Admitting: Family Medicine

## 2016-07-22 VITALS — BP 142/70 | HR 58 | Temp 98.0°F | Resp 16 | Ht 60.0 in | Wt 200.0 lb

## 2016-07-22 DIAGNOSIS — I10 Essential (primary) hypertension: Secondary | ICD-10-CM | POA: Diagnosis not present

## 2016-07-22 DIAGNOSIS — E559 Vitamin D deficiency, unspecified: Secondary | ICD-10-CM | POA: Diagnosis not present

## 2016-07-22 DIAGNOSIS — F410 Panic disorder [episodic paroxysmal anxiety] without agoraphobia: Secondary | ICD-10-CM

## 2016-07-22 DIAGNOSIS — E119 Type 2 diabetes mellitus without complications: Secondary | ICD-10-CM

## 2016-07-22 DIAGNOSIS — E7800 Pure hypercholesterolemia, unspecified: Secondary | ICD-10-CM | POA: Diagnosis not present

## 2016-07-22 LAB — POCT GLYCOSYLATED HEMOGLOBIN (HGB A1C)
Est. average glucose Bld gHb Est-mCnc: 131
Hemoglobin A1C: 6.2

## 2016-07-22 MED ORDER — VITAMIN D (ERGOCALCIFEROL) 1.25 MG (50000 UNIT) PO CAPS: 50000.0000 [IU] | ORAL_CAPSULE | ORAL | 3 refills | Status: DC

## 2016-07-22 MED ORDER — ALPRAZOLAM 0.25 MG PO TABS: ORAL_TABLET | ORAL | 2 refills | Status: DC

## 2016-07-22 NOTE — Progress Notes (Signed)
Patient: Danielle Rowland Female    DOB: November 10, 1940   76 y.o.   MRN: [id number] Visit Date: 07/22/2016  Today's Provider: Lelon Huh, MD   Chief Complaint  Patient presents with  . Hypertension  . Diabetes  . Hyperlipidemia  . vitamin d deficiency   Subjective:    HPI  Hypertension, follow-up:  BP Readings from Last 3 Encounters:  07/22/16 (!) 142/70  04/29/16 (!) 160/70  04/24/16 (!) 144/83    She was last seen for hypertension 3 months ago.  BP at that visit was 160/70. Management since that visit includes recommending to increase lisinopril/hctz to get BP under better control, but patient declined . She reports good compliance with treatment. She is not having side effects.  She is not exercising. She is adherent to low salt diet.   Outside blood pressures are not being checked at home. She is experiencing none.  Patient denies exertional chest pressure/discomfort and palpitations.   Cardiovascular risk factors include diabetes mellitus, dyslipidemia and obesity (BMI >= 30 kg/m2).  Weight trend: stable Wt Readings from Last 3 Encounters:  07/22/16 200 lb (90.7 kg)  04/29/16 211 lb (95.7 kg)  04/24/16 208 lb 4 oz (94.5 kg)    Current diet: well balanced    Diabetes Mellitus Type II, Follow-up:   Lab Results  Component Value Date   HGBA1C 6.2 07/22/2016   HGBA1C 6.1 (H) 05/08/2016   HGBA1C 6.2 12/25/2015    Last seen for diabetes 3 months ago.  Management since then includes no changes. She reports good compliance with treatment. She is not having side effects.  Current symptoms include none and have been stable. Home blood sugar records: fasting range: 120s  Episodes of hypoglycemia? no   Current Insulin Regimen: none Most Recent Eye Exam: up to date Weight trend: stable Prior visit with dietician: no Current diet: well balanced Current exercise: none  Pertinent Labs:    Component Value Date/Time   CHOL 188 05/08/2016 0842   TRIG 171 (H) 05/08/2016 0842   HDL 41 05/08/2016 0842   LDLCALC 113 (H) 05/08/2016 0842   CREATININE 1.00 04/24/2016 1323   CREATININE 1.18 02/16/2014 1430    Wt Readings from Last 3 Encounters:  07/22/16 200 lb (90.7 kg)  04/29/16 211 lb (95.7 kg)  04/24/16 208 lb 4 oz (94.5 kg)      Lipid/Cholesterol, Follow-up:   Last seen for this3 months ago.  Management changes since that visit include no changes. Recommended to avoid saturated fats in her diet.  . Last Lipid Panel:    Component Value Date/Time   CHOL 188 05/08/2016 0842   TRIG 171 (H) 05/08/2016 0842   HDL 41 05/08/2016 0842   CHOLHDL 4.6 (H) 05/08/2016 0842   LDLCALC 113 (H) 05/08/2016 7357    Risk factors for vascular disease include diabetes mellitus and hypertension   She is not having side effects.  Current symptoms include none and have been stable. Weight trend: stable Prior visit with dietician: no Current diet: well balanced Current exercise: none  Wt Readings from Last 3 Encounters:  07/22/16 200 lb (90.7 kg)  04/29/16 211 lb (95.7 kg)  04/24/16 208 lb 4 oz (94.5 kg)     Vitamin D deficiency, follow up: Patient was last seen 3 months ago. No changes were made in her medications. She reports that she no longer takes the Vitamin D supplement. She reports that she took it until the prescription ran out (which  was about 12 weeks).  Lab Results  Component Value Date   VD25OH 7.9 (L) 05/08/2016   Anxiety attacks She states she has been very anxious about her sister's health the last several months and has had a few anxiety attacks. Dr. Rogue Bussing prescribed alprazolam which she takes occasionally and works well.       Allergies  Allergen Reactions  . Influenza Vaccines   . Iodinated Diagnostic Agents     IVP Dye  . Metformin Hcl Nausea Only and Nausea And Vomiting  . Penicillins   . Sulfa Antibiotics      Current Outpatient Prescriptions:  .  ALPRAZolam (XANAX) 0.25 MG tablet, Take one  tablet as needed prior to port access, Disp: 30 tablet, Rfl: 0 .  lisinopril-hydrochlorothiazide (PRINZIDE,ZESTORETIC) 10-12.5 MG tablet, TAKE 1 TABLET BY MOUTH DAILY., Disp: 90 tablet, Rfl: 4 .  pioglitazone (ACTOS) 15 MG tablet, Take 1 tablet (15 mg total) by mouth daily., Disp: 30 tablet, Rfl: 12 .  promethazine (PHENERGAN) 25 MG tablet, Take 1 tablet (25 mg total) by mouth every 6 (six) hours as needed. nausea, Disp: 60 tablet, Rfl: 1 .  warfarin (COUMADIN) 1 MG tablet, TAKE 1 TABLET (1 MG TOTAL) BY MOUTH DAILY., Disp: 90 tablet, Rfl: 3 .  Vitamin D, Ergocalciferol, (DRISDOL) 50000 units CAPS capsule, Take 1 capsule (50,000 Units total) by mouth every 7 (seven) days. (Patient not taking: Reported on 07/22/2016), Disp: 12 capsule, Rfl: 0 No current facility-administered medications for this visit.   Facility-Administered Medications Ordered in Other Visits:  .  heparin lock flush 100 unit/mL, 500 Units, Intravenous, Once, Herring, Magda Paganini F, NP .  sodium chloride 0.9 % injection 10 mL, 10 mL, Intravenous, PRN, Leia Alf, MD, 10 mL at 06/08/14 1458 .  sodium chloride 0.9 % injection 10 mL, 10 mL, Intravenous, PRN, Leia Alf, MD, 10 mL at 08/17/14 1352 .  sodium chloride 0.9 % injection 10 mL, 10 mL, Intravenous, Once, Herring, Orville Govern, NP  Review of Systems  Constitutional: Negative.   Respiratory: Negative.   Cardiovascular: Negative.   Endocrine: Negative.   Musculoskeletal: Negative.     Social History  Substance Use Topics  . Smoking status: Never Smoker  . Smokeless tobacco: Never Used  . Alcohol use No   Objective:   BP (!) 142/70 (BP Location: Right Arm, Patient Position: Sitting, Cuff Size: Large)   Pulse (!) 58   Temp 98 F (36.7 C)   Resp 16   Ht 5' (1.524 m)   Wt 200 lb (90.7 kg) Comment: per patient, 212lbs per office scale  BMI 39.06 kg/m  Vitals:   07/22/16 1127  BP: (!) 142/70  Pulse: (!) 58  Resp: 16  Temp: 98 F (36.7 C)  Weight: 200 lb (90.7  kg)  Height: 5' (1.524 m)     Physical Exam  General Appearance:    Alert, cooperative, no distress, obese  Eyes:    PERRL, conjunctiva/corneas clear, EOM's intact       Lungs:     Clear to auscultation bilaterally, respirations unlabored  Heart:    Regular rate and rhythm  Neurologic:   Awake, alert, oriented x 3. No apparent focal neurological           defect.         Results for orders placed or performed in visit on 07/22/16  POCT glycosylated hemoglobin (Hb A1C)  Result Value Ref Range   Hemoglobin A1C 6.2    Est. average glucose Bld  gHb Est-mCnc 131        Assessment & Plan:     1. Essential hypertension Improved, she does not want to increase medications.   2. Type 2 diabetes mellitus without complication, without long-term current use of insulin (HCC) Stable. Continue current medications.  - POCT glycosylated hemoglobin (Hb A1C)  3. Hypercholesteremia She is not interested in starting statin at this time. Will discuss further at follow up   4. Vitamin D deficiency Tolerating weekly vitamin d well, but out for a couple of weeks. Will restart and check levels at follow up . - Vitamin D, Ergocalciferol, (DRISDOL) 50000 units CAPS capsule; Take 1 capsule (50,000 Units total) by mouth every 7 (seven) days.  Dispense: 12 capsule; Refill: 3  5. Anxiety attack Did well with occasional alprazolam prescribed by hematology.  - ALPRAZolam (XANAX) 0.25 MG tablet; Take one tablet as needed prior to port access  Dispense: 30 tablet; Refill: 2  Return in about 5 months (around 12/22/2016).  Check a1c, lipids, vitamin d and met C at follow up [place], MD  [place]

## 2016-08-07 ENCOUNTER — Inpatient Hospital Stay: Payer: Medicare Other | Attending: Internal Medicine

## 2016-08-07 DIAGNOSIS — Z85048 Personal history of other malignant neoplasm of rectum, rectosigmoid junction, and anus: Secondary | ICD-10-CM | POA: Diagnosis not present

## 2016-08-07 DIAGNOSIS — Z452 Encounter for adjustment and management of vascular access device: Secondary | ICD-10-CM | POA: Diagnosis not present

## 2016-08-07 DIAGNOSIS — Z95828 Presence of other vascular implants and grafts: Secondary | ICD-10-CM

## 2016-08-07 MED ORDER — HEPARIN SOD (PORK) LOCK FLUSH 100 UNIT/ML IV SOLN
500.0000 [IU] | Freq: Once | INTRAVENOUS | Status: AC
  Administered 2016-08-07: 500 [IU] via INTRAVENOUS

## 2016-08-07 MED ORDER — SODIUM CHLORIDE 0.9% FLUSH
10.0000 mL | INTRAVENOUS | Status: DC | PRN
  Administered 2016-08-07: 10 mL via INTRAVENOUS
  Filled 2016-08-07: qty 10

## 2016-08-25 ENCOUNTER — Other Ambulatory Visit: Payer: Self-pay | Admitting: Family Medicine

## 2016-09-11 ENCOUNTER — Inpatient Hospital Stay: Payer: Medicare Other | Attending: Internal Medicine

## 2016-09-11 DIAGNOSIS — Z95828 Presence of other vascular implants and grafts: Secondary | ICD-10-CM

## 2016-09-11 DIAGNOSIS — Z85048 Personal history of other malignant neoplasm of rectum, rectosigmoid junction, and anus: Secondary | ICD-10-CM | POA: Insufficient documentation

## 2016-09-11 DIAGNOSIS — Z452 Encounter for adjustment and management of vascular access device: Secondary | ICD-10-CM | POA: Insufficient documentation

## 2016-09-11 MED ORDER — HEPARIN SOD (PORK) LOCK FLUSH 100 UNIT/ML IV SOLN
500.0000 [IU] | Freq: Once | INTRAVENOUS | Status: AC
  Administered 2016-09-11: 500 [IU] via INTRAVENOUS

## 2016-09-11 MED ORDER — SODIUM CHLORIDE 0.9% FLUSH
10.0000 mL | INTRAVENOUS | Status: DC | PRN
  Administered 2016-09-11: 10 mL via INTRAVENOUS
  Filled 2016-09-11: qty 10

## 2016-10-16 ENCOUNTER — Inpatient Hospital Stay: Payer: Medicare Other | Attending: Internal Medicine

## 2016-10-16 DIAGNOSIS — Z85048 Personal history of other malignant neoplasm of rectum, rectosigmoid junction, and anus: Secondary | ICD-10-CM | POA: Insufficient documentation

## 2016-10-16 DIAGNOSIS — Z95828 Presence of other vascular implants and grafts: Secondary | ICD-10-CM

## 2016-10-16 DIAGNOSIS — Z452 Encounter for adjustment and management of vascular access device: Secondary | ICD-10-CM | POA: Diagnosis not present

## 2016-10-16 MED ORDER — SODIUM CHLORIDE 0.9% FLUSH
10.0000 mL | INTRAVENOUS | Status: DC | PRN
  Administered 2016-10-16: 10 mL via INTRAVENOUS
  Filled 2016-10-16: qty 10

## 2016-10-16 MED ORDER — HEPARIN SOD (PORK) LOCK FLUSH 100 UNIT/ML IV SOLN
500.0000 [IU] | Freq: Once | INTRAVENOUS | Status: AC
  Administered 2016-10-16: 500 [IU] via INTRAVENOUS

## 2016-11-20 ENCOUNTER — Inpatient Hospital Stay: Payer: Medicare Other | Attending: Internal Medicine

## 2016-11-20 DIAGNOSIS — Z452 Encounter for adjustment and management of vascular access device: Secondary | ICD-10-CM | POA: Diagnosis not present

## 2016-11-20 DIAGNOSIS — Z85048 Personal history of other malignant neoplasm of rectum, rectosigmoid junction, and anus: Secondary | ICD-10-CM | POA: Insufficient documentation

## 2016-11-20 DIAGNOSIS — Z95828 Presence of other vascular implants and grafts: Secondary | ICD-10-CM

## 2016-11-20 MED ORDER — SODIUM CHLORIDE 0.9% FLUSH
10.0000 mL | INTRAVENOUS | Status: AC | PRN
  Administered 2016-11-20: 10 mL
  Filled 2016-11-20: qty 10

## 2016-11-20 MED ORDER — HEPARIN SOD (PORK) LOCK FLUSH 100 UNIT/ML IV SOLN
500.0000 [IU] | INTRAVENOUS | Status: AC | PRN
  Administered 2016-11-20: 500 [IU]

## 2016-12-14 ENCOUNTER — Other Ambulatory Visit: Payer: Self-pay | Admitting: Family Medicine

## 2016-12-14 DIAGNOSIS — I10 Essential (primary) hypertension: Secondary | ICD-10-CM

## 2016-12-25 ENCOUNTER — Inpatient Hospital Stay: Payer: Medicare Other | Attending: Internal Medicine

## 2016-12-25 DIAGNOSIS — Z452 Encounter for adjustment and management of vascular access device: Secondary | ICD-10-CM | POA: Insufficient documentation

## 2016-12-25 DIAGNOSIS — Z85048 Personal history of other malignant neoplasm of rectum, rectosigmoid junction, and anus: Secondary | ICD-10-CM | POA: Diagnosis not present

## 2016-12-25 DIAGNOSIS — Z95828 Presence of other vascular implants and grafts: Secondary | ICD-10-CM

## 2016-12-25 MED ORDER — SODIUM CHLORIDE 0.9% FLUSH
10.0000 mL | INTRAVENOUS | Status: AC | PRN
  Administered 2016-12-25: 10 mL
  Filled 2016-12-25: qty 10

## 2016-12-25 MED ORDER — HEPARIN SOD (PORK) LOCK FLUSH 100 UNIT/ML IV SOLN
500.0000 [IU] | INTRAVENOUS | Status: AC | PRN
Start: 2016-12-25 — End: 2016-12-25
  Administered 2016-12-25: 500 [IU]

## 2016-12-30 ENCOUNTER — Ambulatory Visit: Payer: Medicare Other | Admitting: Family Medicine

## 2017-01-29 ENCOUNTER — Inpatient Hospital Stay: Payer: Medicare Other | Attending: Internal Medicine

## 2017-01-29 DIAGNOSIS — Z85048 Personal history of other malignant neoplasm of rectum, rectosigmoid junction, and anus: Secondary | ICD-10-CM | POA: Diagnosis not present

## 2017-01-29 DIAGNOSIS — Z452 Encounter for adjustment and management of vascular access device: Secondary | ICD-10-CM | POA: Insufficient documentation

## 2017-01-29 DIAGNOSIS — Z95828 Presence of other vascular implants and grafts: Secondary | ICD-10-CM

## 2017-01-29 MED ORDER — SODIUM CHLORIDE 0.9% FLUSH
10.0000 mL | INTRAVENOUS | Status: AC | PRN
  Administered 2017-01-29: 10 mL
  Filled 2017-01-29: qty 10

## 2017-01-29 MED ORDER — HEPARIN SOD (PORK) LOCK FLUSH 100 UNIT/ML IV SOLN
500.0000 [IU] | INTRAVENOUS | Status: AC | PRN
  Administered 2017-01-29: 500 [IU]

## 2017-02-19 ENCOUNTER — Other Ambulatory Visit: Payer: Self-pay | Admitting: *Deleted

## 2017-02-19 MED ORDER — WARFARIN SODIUM 1 MG PO TABS: 1.0000 mg | ORAL_TABLET | Freq: Every day | ORAL | 3 refills | Status: DC

## 2017-03-03 ENCOUNTER — Ambulatory Visit (INDEPENDENT_AMBULATORY_CARE_PROVIDER_SITE_OTHER): Payer: Medicare Other | Admitting: Family Medicine

## 2017-03-03 ENCOUNTER — Encounter: Payer: Self-pay | Admitting: Family Medicine

## 2017-03-03 VITALS — BP 110/78 | HR 64 | Temp 97.9°F | Resp 16 | Wt 218.0 lb

## 2017-03-03 DIAGNOSIS — F410 Panic disorder [episodic paroxysmal anxiety] without agoraphobia: Secondary | ICD-10-CM

## 2017-03-03 DIAGNOSIS — E119 Type 2 diabetes mellitus without complications: Secondary | ICD-10-CM | POA: Diagnosis not present

## 2017-03-03 DIAGNOSIS — E559 Vitamin D deficiency, unspecified: Secondary | ICD-10-CM

## 2017-03-03 DIAGNOSIS — I10 Essential (primary) hypertension: Secondary | ICD-10-CM

## 2017-03-03 MED ORDER — ALPRAZOLAM 0.25 MG PO TABS: 0.1250 mg | ORAL_TABLET | Freq: Two times a day (BID) | ORAL | 3 refills | Status: DC | PRN

## 2017-03-03 NOTE — Progress Notes (Signed)
Patient: Danielle Rowland Female    DOB: Mar 10, 1940   77 y.o.   MRN: [id number] Visit Date: 03/03/2017  Today's Provider: Lelon Huh, MD   Chief Complaint  Patient presents with  . Follow-up  . Diabetes  . Hypertension  . Hyperlipidemia   Subjective:    HPI   Diabetes Mellitus Type II, Follow-up:   Lab Results  Component Value Date   HGBA1C 6.2 07/22/2016   HGBA1C 6.1 (H) 05/08/2016   HGBA1C 6.2 12/25/2015   Last seen for diabetes 7 months ago.  Management since then includes; no changes. She reports fair compliance with treatment. She is not having side effects. none Current symptoms include none and have been unchanged. Home blood sugar records: fasting range: not sure what her numbers are  Episodes of hypoglycemia? no   Current Insulin Regimen: n/a Most Recent Eye Exam: 2 years ago Weight trend: stable Prior visit with dietician: no Current diet: in general, an "unhealthy" diet Current exercise: none  ----------------------------------------------------------------   Hypertension, follow-up:  BP Readings from Last 3 Encounters:  07/22/16 (!) 142/70  04/29/16 (!) 160/70  04/24/16 (!) 144/83    She was last seen for hypertension 7 months ago.  BP at that visit was 142/70. Management since that visit includes; no changes..She reports good compliance with treatment. She is not having side effects. none She is not exercising. She is not adherent to low salt diet.   Outside blood pressures are not checking. She is experiencing none.  Patient denies none.   Cardiovascular risk factors include diabetes mellitus.  Use of agents associated with hypertension: none.   ----------------------------------------------------------------    Wt Readings from Last 3 Encounters:  07/22/16 200 lb (90.7 kg)  04/29/16 211 lb (95.7 kg)  04/24/16 208 lb 4 oz (94.5 kg)    ----------------------------------------------------------------  States that  she has been more anxious and not sleeping since her sister passed away in 2022-11-10. Has been taking low dose of alprazolam when she was treated for cancer and which she states was helping quite a bit, but ran out about 3 weeks ago. Was taking twice a day. States she doesn't feel depressed   Allergies  Allergen Reactions  . Influenza Vaccines   . Iodinated Diagnostic Agents     IVP Dye  . Metformin Hcl Nausea Only and Nausea And Vomiting  . Penicillins   . Sulfa Antibiotics      Current Outpatient Medications:  .  ALPRAZolam (XANAX) 0.25 MG tablet, Take one tablet as needed prior to port access, Disp: 30 tablet, Rfl: 2 .  lisinopril-hydrochlorothiazide (PRINZIDE,ZESTORETIC) 10-12.5 MG tablet, TAKE 1 TABLET BY MOUTH DAILY., Disp: 90 tablet, Rfl: 4 .  pioglitazone (ACTOS) 15 MG tablet, TAKE 1 TABLET (15 MG TOTAL) BY MOUTH DAILY., Disp: 30 tablet, Rfl: 12 .  promethazine (PHENERGAN) 25 MG tablet, Take 1 tablet (25 mg total) by mouth every 6 (six) hours as needed. nausea, Disp: 60 tablet, Rfl: 1 .  Vitamin D, Ergocalciferol, (DRISDOL) 50000 units CAPS capsule, TAKE 1 CAPSULE (50,000 UNITS TOTAL) BY MOUTH EVERY 7 (SEVEN) DAYS., Disp: 12 capsule, Rfl: 3 .  warfarin (COUMADIN) 1 MG tablet, Take 1 tablet (1 mg total) by mouth daily., Disp: 90 tablet, Rfl: 3   Review of Systems  Constitutional: Negative for appetite change, chills, fatigue and fever.  Respiratory: Negative for chest tightness and shortness of breath.   Cardiovascular: Negative for chest pain and palpitations.  Gastrointestinal: Negative for abdominal pain,  nausea and vomiting.  Neurological: Negative for dizziness and weakness.    Social History   Tobacco Use  . Smoking status: Never Smoker  . Smokeless tobacco: Never Used  Substance Use Topics  . Alcohol use: No   Objective:   BP 110/78 (BP Location: Right Arm, Patient Position: Sitting, Cuff Size: Large)   Pulse 64   Temp 97.9 F (36.6 C) (Oral)   Resp 16   Wt  218 lb (98.9 kg)   SpO2 97%   BMI 42.58 kg/m     Physical Exam  General Appearance:    Alert, cooperative, no distress, obese  Eyes:    PERRL, conjunctiva/corneas clear, EOM's intact       Lungs:     Clear to auscultation bilaterally, respirations unlabored  Heart:    Regular rate and rhythm  Neurologic:   Awake, alert, oriented x 3. No apparent focal neurological           defect.           Assessment & Plan:     1. Anxiety attacks restart- ALPRAZolam (XANAX) 0.25 MG tablet; Take 0.5-1 tablets (0.125-0.25 mg total) by mouth 2 (two) times daily as needed for anxiety.  Dispense: 30 tablet; Refill: 3  2. Essential hypertension Well controlled.  Continue current medications.   - Renal function panel  3. Type 2 diabetes mellitus without complication, without long-term current use of insulin (HCC)  - Hemoglobin A1c  4. Vitamin D deficiency  - VITAMIN D 25 Hydroxy (Vit-D Deficiency, Fractures)  She states she had flu vaccine at [facility] in the fall.  Return in about 6 months (around 08/31/2017).       Lelon Huh, MD  [facility]

## 2017-03-04 ENCOUNTER — Telehealth: Payer: Self-pay | Admitting: *Deleted

## 2017-03-04 LAB — RENAL FUNCTION PANEL
Albumin: 3.9 g/dL (ref 3.5–4.8)
BUN / CREAT RATIO: 15 (ref 12–28)
BUN: 18 mg/dL (ref 8–27)
CO2: 26 mmol/L (ref 20–29)
Calcium: 8.8 mg/dL (ref 8.7–10.3)
Chloride: 101 mmol/L (ref 96–106)
Creatinine, Ser: 1.22 mg/dL — ABNORMAL HIGH (ref 0.57–1.00)
GFR calc non Af Amer: 43 mL/min/{1.73_m2} — ABNORMAL LOW (ref >59)
GFR, EST AFRICAN AMERICAN: 50 mL/min/{1.73_m2} — AB (ref >59)
GLUCOSE: 96 mg/dL (ref 65–99)
POTASSIUM: 4.3 mmol/L (ref 3.5–5.2)
Phosphorus: 2.9 mg/dL (ref 2.5–4.5)
Sodium: 142 mmol/L (ref 134–144)

## 2017-03-04 LAB — VITAMIN D 25 HYDROXY (VIT D DEFICIENCY, FRACTURES): Vit D, 25-Hydroxy: 25.7 ng/mL — ABNORMAL LOW (ref 30.0–100.0)

## 2017-03-04 LAB — HEMOGLOBIN A1C
Est. average glucose Bld gHb Est-mCnc: 123 mg/dL
Hgb A1c MFr Bld: 5.9 % — ABNORMAL HIGH (ref 4.8–5.6)

## 2017-03-04 NOTE — Telephone Encounter (Signed)
No answer and no vm. Will try again later.  

## 2017-03-04 NOTE — Telephone Encounter (Signed)
-----   Message from Birdie Sons, MD sent at 03/04/2017  7:57 AM EST ----- Vitamin d slightly low, but much better. Continue weekly vitamin d supplements. a1c is good at 5.9 Continue current medications.  Follow up in august, as scheduled.

## 2017-03-05 ENCOUNTER — Inpatient Hospital Stay: Payer: Medicare Other | Attending: Internal Medicine

## 2017-03-05 DIAGNOSIS — Z85048 Personal history of other malignant neoplasm of rectum, rectosigmoid junction, and anus: Secondary | ICD-10-CM | POA: Insufficient documentation

## 2017-03-05 DIAGNOSIS — Z95828 Presence of other vascular implants and grafts: Secondary | ICD-10-CM

## 2017-03-05 DIAGNOSIS — Z452 Encounter for adjustment and management of vascular access device: Secondary | ICD-10-CM | POA: Diagnosis not present

## 2017-03-05 MED ORDER — HEPARIN SOD (PORK) LOCK FLUSH 100 UNIT/ML IV SOLN
500.0000 [IU] | INTRAVENOUS | Status: AC | PRN
  Administered 2017-03-05: 500 [IU]

## 2017-03-05 MED ORDER — SODIUM CHLORIDE 0.9% FLUSH
10.0000 mL | INTRAVENOUS | Status: AC | PRN
  Administered 2017-03-05: 10 mL
  Filled 2017-03-05: qty 10

## 2017-03-06 NOTE — Telephone Encounter (Signed)
Tried calling pt again, no answer, no vm. Will try again later.

## 2017-03-11 NOTE — Telephone Encounter (Signed)
Unable to contact patient. Will save message to the chart.

## 2017-04-09 ENCOUNTER — Inpatient Hospital Stay: Payer: Medicare Other | Attending: Oncology

## 2017-04-09 ENCOUNTER — Inpatient Hospital Stay: Payer: Medicare Other

## 2017-04-09 DIAGNOSIS — Z452 Encounter for adjustment and management of vascular access device: Secondary | ICD-10-CM | POA: Diagnosis not present

## 2017-04-09 DIAGNOSIS — Z85048 Personal history of other malignant neoplasm of rectum, rectosigmoid junction, and anus: Secondary | ICD-10-CM | POA: Diagnosis not present

## 2017-04-09 DIAGNOSIS — Z95828 Presence of other vascular implants and grafts: Secondary | ICD-10-CM

## 2017-04-09 MED ORDER — HEPARIN SOD (PORK) LOCK FLUSH 100 UNIT/ML IV SOLN
500.0000 [IU] | INTRAVENOUS | Status: AC | PRN
  Administered 2017-04-09: 500 [IU]

## 2017-04-09 MED ORDER — SODIUM CHLORIDE 0.9% FLUSH
10.0000 mL | INTRAVENOUS | Status: AC | PRN
  Administered 2017-04-09: 10 mL
  Filled 2017-04-09: qty 10

## 2017-04-23 ENCOUNTER — Other Ambulatory Visit: Payer: Medicare Other

## 2017-04-23 ENCOUNTER — Ambulatory Visit: Payer: Medicare Other | Admitting: Internal Medicine

## 2017-05-14 ENCOUNTER — Inpatient Hospital Stay: Payer: Medicare Other

## 2017-05-14 ENCOUNTER — Encounter: Payer: Self-pay | Admitting: Internal Medicine

## 2017-05-14 ENCOUNTER — Other Ambulatory Visit: Payer: Self-pay

## 2017-05-14 ENCOUNTER — Inpatient Hospital Stay: Payer: Medicare Other | Attending: Internal Medicine | Admitting: Internal Medicine

## 2017-05-14 VITALS — BP 118/60 | HR 72 | Temp 97.9°F | Resp 22 | Ht 60.0 in | Wt 207.0 lb

## 2017-05-14 DIAGNOSIS — R112 Nausea with vomiting, unspecified: Secondary | ICD-10-CM | POA: Insufficient documentation

## 2017-05-14 DIAGNOSIS — C20 Malignant neoplasm of rectum: Secondary | ICD-10-CM

## 2017-05-14 DIAGNOSIS — E876 Hypokalemia: Secondary | ICD-10-CM | POA: Diagnosis not present

## 2017-05-14 DIAGNOSIS — F419 Anxiety disorder, unspecified: Secondary | ICD-10-CM | POA: Diagnosis not present

## 2017-05-14 DIAGNOSIS — Z95828 Presence of other vascular implants and grafts: Secondary | ICD-10-CM

## 2017-05-14 DIAGNOSIS — Z452 Encounter for adjustment and management of vascular access device: Secondary | ICD-10-CM | POA: Insufficient documentation

## 2017-05-14 DIAGNOSIS — Z85048 Personal history of other malignant neoplasm of rectum, rectosigmoid junction, and anus: Secondary | ICD-10-CM | POA: Diagnosis not present

## 2017-05-14 LAB — COMPREHENSIVE METABOLIC PANEL
ALT: 15 U/L (ref 14–54)
AST: 21 U/L (ref 15–41)
Albumin: 3.5 g/dL (ref 3.5–5.0)
Alkaline Phosphatase: 51 U/L (ref 38–126)
Anion gap: 12 (ref 5–15)
BUN: 31 mg/dL — AB (ref 6–20)
CHLORIDE: 98 mmol/L — AB (ref 101–111)
CO2: 26 mmol/L (ref 22–32)
CREATININE: 1.73 mg/dL — AB (ref 0.44–1.00)
Calcium: 8.7 mg/dL — ABNORMAL LOW (ref 8.9–10.3)
GFR calc Af Amer: 32 mL/min — ABNORMAL LOW (ref >60)
GFR calc non Af Amer: 27 mL/min — ABNORMAL LOW (ref >60)
GLUCOSE: 110 mg/dL — AB (ref 65–99)
POTASSIUM: 2.8 mmol/L — AB (ref 3.5–5.1)
Sodium: 136 mmol/L (ref 135–145)
Total Bilirubin: 1.9 mg/dL — ABNORMAL HIGH (ref 0.3–1.2)
Total Protein: 6.9 g/dL (ref 6.5–8.1)

## 2017-05-14 LAB — CBC WITH DIFFERENTIAL/PLATELET
Basophils Absolute: 0.1 10*3/uL (ref 0–0.1)
Basophils Relative: 1 %
EOS PCT: 1 %
Eosinophils Absolute: 0.1 10*3/uL (ref 0–0.7)
HCT: 43.9 % (ref 35.0–47.0)
Hemoglobin: 15.4 g/dL (ref 12.0–16.0)
LYMPHS ABS: 4.4 10*3/uL — AB (ref 1.0–3.6)
LYMPHS PCT: 39 %
MCH: 33.1 pg (ref 26.0–34.0)
MCHC: 35.1 g/dL (ref 32.0–36.0)
MCV: 94.4 fL (ref 80.0–100.0)
MONO ABS: 1.5 10*3/uL — AB (ref 0.2–0.9)
Monocytes Relative: 13 %
Neutro Abs: 5.3 10*3/uL (ref 1.4–6.5)
Neutrophils Relative %: 46 %
PLATELETS: 239 10*3/uL (ref 150–440)
RBC: 4.64 MIL/uL (ref 3.80–5.20)
RDW: 14.1 % (ref 11.5–14.5)
WBC: 11.3 10*3/uL — ABNORMAL HIGH (ref 3.6–11.0)

## 2017-05-14 MED ORDER — SODIUM CHLORIDE 0.9% FLUSH
10.0000 mL | INTRAVENOUS | Status: AC | PRN
  Administered 2017-05-14: 10 mL
  Filled 2017-05-14: qty 10

## 2017-05-14 MED ORDER — POTASSIUM CHLORIDE CRYS ER 20 MEQ PO TBCR: EXTENDED_RELEASE_TABLET | ORAL | 3 refills | Status: DC

## 2017-05-14 MED ORDER — PROMETHAZINE HCL 25 MG PO TABS: 25.0000 mg | ORAL_TABLET | Freq: Four times a day (QID) | ORAL | 1 refills | Status: DC | PRN

## 2017-05-14 MED ORDER — HEPARIN SOD (PORK) LOCK FLUSH 100 UNIT/ML IV SOLN
500.0000 [IU] | INTRAVENOUS | Status: AC | PRN
  Administered 2017-05-14: 500 [IU]

## 2017-05-14 NOTE — Assessment & Plan Note (Addendum)
#   rectal cancer- with recurrence [2005] left pelvic area- status post palliative radiation.   # Currently in remission. Patient is likely cured of her rectal cancer. But recommend surviellance colonoscopy  [2004-last].   # mild lymphocytosis/with mild monocytosis reviewed the labs in detail- sec ? Splenectomy on vaccinations  # Nausea/ vomitting- ? Viral- imporved on phenegran.    # severe hypoklemia-potassium 2.8; likely second of poor p.o. intake/nausea vomiting diarrhea.  # elevated creatinine-/acute renal failure creatinine 1.76 baseline 0.9.  Likely secondary to poor p.o. intake/nausea vomiting diarrhea.  Recommend stopping ACE inhibitor.  Also recommend she follows up with PCP regarding repeating the labs.  # port flushes every 8 weeks as per patient preference/ coumadin [Hx of DVT]  # anxiety/mild nausea- Xanax prior to port access and Phenergan prescribed.  # She'll follow-up with me in one year; labs.   Cc; Dr.Fisher

## 2017-05-14 NOTE — Patient Instructions (Signed)
#  Stop taking your blood pressure medication [lisinopril-hydrochlorothiazide]  #Check blood pressure at home-if elevated reach out to PCP  #Reach out to PCPs office regarding a follow-up/in approximately 1 week/labs regarding abnormal potassium and kidney numbers
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
## 2017-05-15 LAB — CEA: CEA: 2 ng/mL (ref 0.0–4.7)

## 2017-05-19 ENCOUNTER — Encounter: Payer: Self-pay | Admitting: Family Medicine

## 2017-05-19 ENCOUNTER — Ambulatory Visit (INDEPENDENT_AMBULATORY_CARE_PROVIDER_SITE_OTHER): Payer: Medicare Other | Admitting: Family Medicine

## 2017-05-19 VITALS — BP 140/78 | HR 64 | Temp 97.9°F | Resp 16 | Ht 60.0 in | Wt 215.0 lb

## 2017-05-19 DIAGNOSIS — N179 Acute kidney failure, unspecified: Secondary | ICD-10-CM

## 2017-05-19 DIAGNOSIS — E876 Hypokalemia: Secondary | ICD-10-CM

## 2017-05-19 DIAGNOSIS — Q8901 Asplenia (congenital): Secondary | ICD-10-CM | POA: Insufficient documentation

## 2017-05-19 NOTE — Progress Notes (Signed)
     Patient: Danielle Rowland Female    DOB: 03/21/1940   77 y.o.   MRN: [id number] Visit Date: 05/19/2017  Today's Provider:  , MD   No chief complaint on file.  Subjective:    HPI  Patient is here to discuss her potassium levels. Patient was seen by oncologist 05/14/2017, who recommended that patient follow-up with pcp in one week for labs regarding abnormal low and increased creatinine and eGFR. She had her lisinopril-hctz held at that time and started on supplemental potassium. She states she had some nausea and vomiting for a few weeks before that visit, but has since resolved and has been pushing clear liquids, mostly water and Gatorade.  She feel well today.   Allergies  Allergen Reactions  . Influenza Vaccines   . Iodinated Diagnostic Agents     IVP Dye  . Metformin Hcl Nausea Only and Nausea And Vomiting  . Penicillins   . Sulfa Antibiotics      Current Outpatient Medications:  .  ALPRAZolam (XANAX) 0.25 MG tablet, Take 0.5-1 tablets (0.125-0.25 mg total) by mouth 2 (two) times daily as needed for anxiety., Disp: 30 tablet, Rfl: 3 .  Cholecalciferol (VITAMIN D3) 5000 units CAPS, Take 5,000 Units by mouth daily., Disp: , Rfl:  .  pioglitazone (ACTOS) 15 MG tablet, TAKE 1 TABLET (15 MG TOTAL) BY MOUTH DAILY., Disp: 30 tablet, Rfl: 12 .  potassium chloride SA (K-DUR,KLOR-CON) 20 MEQ tablet, 1 pill twice a day, Disp: 15 tablet, Rfl: 3 .  promethazine (PHENERGAN) 25 MG tablet, Take 1 tablet (25 mg total) by mouth every 6 (six) hours as needed. nausea, Disp: 60 tablet, Rfl: 1 .  warfarin (COUMADIN) 1 MG tablet, Take 1 tablet (1 mg total) by mouth daily., Disp: 90 tablet, Rfl: 3 .  lisinopril-hydrochlorothiazide (PRINZIDE,ZESTORETIC) 10-12.5 MG tablet, TAKE 1 TABLET BY MOUTH DAILY. (Patient not taking: Reported on 05/19/2017), Disp: 90 tablet, Rfl: 4 .  Vitamin D, Ergocalciferol, (DRISDOL) 50000 units CAPS capsule, TAKE 1 CAPSULE (50,000 UNITS TOTAL) BY MOUTH EVERY 7  (SEVEN) DAYS. (Patient not taking: Reported on 05/19/2017), Disp: 12 capsule, Rfl: 3 No current facility-administered medications for this visit.   Facility-Administered Medications Ordered in Other Visits:  .  heparin lock flush 100 unit/mL, 500 Units, Intravenous, Once, Herring, Leslie F, NP  Review of Systems  Constitutional: Negative for appetite change, chills, fatigue and fever.  Respiratory: Negative for chest tightness and shortness of breath.   Cardiovascular: Negative for chest pain and palpitations.  Gastrointestinal: Negative for abdominal pain, nausea and vomiting.  Neurological: Negative for dizziness and weakness.    Social History   Tobacco Use  . Smoking status: Never Smoker  . Smokeless tobacco: Never Used  Substance Use Topics  . Alcohol use: No   Objective:   BP 140/78 (BP Location: Right Arm, Patient Position: Sitting, Cuff Size: Large)   Pulse 64   Temp 97.9 F (36.6 C) (Oral)   Resp 16   Ht 5' (1.524 m)   Wt 215 lb (97.5 kg)   SpO2 98%   BMI 41.99 kg/m  Vitals:   05/19/17 1054  BP: 140/78  Pulse: 64  Resp: 16  Temp: 97.9 F (36.6 C)  TempSrc: Oral  SpO2: 98%  Weight: 215 lb (97.5 kg)  Height: 5' (1.524 m)     Physical Exam  General appearance: alert, well developed, well nourished, cooperative and in no distress Head: Normocephalic, without obvious abnormality, atraumatic Respiratory: Respirations even and   unlabored, normal respiratory rate Extremities: No gross deformities  Neurologic: Mental status: Alert, oriented to person, place, and time, thought content appropriate.     Assessment & Plan:     1. Acute kidney injury ([place]) Likely due to N&V which has since resolved. Recently put on supplemental potassium (which she would like to stop) and lisinopril-hctz on hold.  - Renal function panel  2.  Hypokalemia Check renal panel. Consider restarting lisinopril and stopping potassium supplement       Lelon Huh, MD  [facility]

## 2017-05-20 ENCOUNTER — Telehealth: Payer: Self-pay

## 2017-05-20 LAB — RENAL FUNCTION PANEL
Albumin: 3.4 g/dL — ABNORMAL LOW (ref 3.5–4.8)
BUN/Creatinine Ratio: 11 — ABNORMAL LOW (ref 12–28)
BUN: 11 mg/dL (ref 8–27)
CO2: 24 mmol/L (ref 20–29)
CREATININE: 0.96 mg/dL (ref 0.57–1.00)
Calcium: 8.9 mg/dL (ref 8.7–10.3)
Chloride: 105 mmol/L (ref 96–106)
GFR, EST AFRICAN AMERICAN: 66 mL/min/{1.73_m2} (ref >59)
GFR, EST NON AFRICAN AMERICAN: 58 mL/min/{1.73_m2} — AB (ref >59)
GLUCOSE: 90 mg/dL (ref 65–99)
POTASSIUM: 4.7 mmol/L (ref 3.5–5.2)
Phosphorus: 2.4 mg/dL — ABNORMAL LOW (ref 2.5–4.5)
SODIUM: 141 mmol/L (ref 134–144)

## 2017-05-20 NOTE — Telephone Encounter (Signed)
-----   Message from Birdie Sons, MD sent at 05/20/2017  7:49 AM EDT ----- Potassium levels are back to normal. Kidney functions are back to normal. Can stop potassium pulls. Recommend she change blood pressure to lisinopril 10mg  once a day, #30, rf x 3. Follow up for BP check in one month.

## 2017-05-20 NOTE — Telephone Encounter (Signed)
Tried calling patient, no answer. Will try again later.  

## 2017-05-21 MED ORDER — LISINOPRIL 10 MG PO TABS: 10.0000 mg | ORAL_TABLET | Freq: Every day | ORAL | 3 refills | Status: DC

## 2017-05-21 NOTE — Telephone Encounter (Signed)
Patient was notified of results. Expressed understanding. Rx sent to pharmacy. 

## 2017-05-21 NOTE — Telephone Encounter (Signed)
Tried calling pt again, no answer and no vm.

## 2017-05-21 NOTE — Telephone Encounter (Signed)
Pt returned call  [phone]  [place]

## 2017-06-23 ENCOUNTER — Encounter: Payer: Self-pay | Admitting: Family Medicine

## 2017-06-23 ENCOUNTER — Ambulatory Visit (INDEPENDENT_AMBULATORY_CARE_PROVIDER_SITE_OTHER): Payer: Medicare Other | Admitting: Family Medicine

## 2017-06-23 VITALS — BP 142/70 | HR 60 | Temp 98.0°F | Resp 16 | Ht 60.0 in | Wt 215.0 lb

## 2017-06-23 DIAGNOSIS — R059 Cough, unspecified: Secondary | ICD-10-CM

## 2017-06-23 DIAGNOSIS — I10 Essential (primary) hypertension: Secondary | ICD-10-CM | POA: Diagnosis not present

## 2017-06-23 DIAGNOSIS — R05 Cough: Secondary | ICD-10-CM | POA: Diagnosis not present

## 2017-06-23 MED ORDER — HYDROCODONE-HOMATROPINE 5-1.5 MG/5ML PO SYRP: 5.0000 mL | ORAL_SOLUTION | Freq: Three times a day (TID) | ORAL | 0 refills | Status: DC | PRN

## 2017-06-23 MED ORDER — AMLODIPINE BESYLATE 2.5 MG PO TABS: 2.5000 mg | ORAL_TABLET | Freq: Every day | ORAL | 3 refills | Status: DC

## 2017-06-23 NOTE — Progress Notes (Signed)
Patient: Danielle Rowland Female    DOB: [birth year]   77 y.o.   MRN: [id number] Visit Date: 06/23/2017  Today's Provider: Lelon Huh, MD   Chief Complaint  Patient presents with  . Follow-up  . Hypertension   Subjective:    HPI   Hypertension, follow-up:  BP Readings from Last 3 Encounters:  06/23/17 134/80  05/19/17 140/78  05/14/17 118/60    She was last seen for hypertension 1 months ago.  BP at that visit was 140/78. Prior to that visit she had been on lisinopril-hctz 10-12.5, which was discontinued by her oncologist due to low potassium of 2.8 and elevated creatinine of 1.73 on 05/14/2017. Her renal panel normalized at o.v. Of 05/19/17 Management since that visit includes; labs checked. Discontinued potassium supplement. Changed bp medication to lisinopril 10 mg qd and advised to follow up in 1 month.She reports fair compliance with treatment.  She reports she started feeing light headed and dizzy and eventually developed vomiting after starting lisinopril, so she stopped taking it a few weeks ago.   Patient also states she has had a non-productive cough for 2 weeks and feeling like some wheezing in her chest. No dyspnea, no fevers, has been hoarse with some post nasal drainage. She has taken hyodrocodone cough syrup in the past which she states was effective for her.   Allergies  Allergen Reactions  . Influenza Vaccines   . Iodinated Diagnostic Agents     IVP Dye  . Metformin Hcl Nausea Only and Nausea And Vomiting  . Penicillins   . Sulfa Antibiotics      Current Outpatient Medications:  .  ALPRAZolam (XANAX) 0.25 MG tablet, Take 0.5-1 tablets (0.125-0.25 mg total) by mouth 2 (two) times daily as needed for anxiety., Disp: 30 tablet, Rfl: 3 .  Cholecalciferol (VITAMIN D3) 5000 units CAPS, Take 5,000 Units by mouth daily., Disp: , Rfl:  .  pioglitazone (ACTOS) 15 MG tablet, TAKE 1 TABLET (15 MG TOTAL) BY MOUTH DAILY., Disp: 30 tablet, Rfl: 12 .   promethazine (PHENERGAN) 25 MG tablet, Take 1 tablet (25 mg total) by mouth every 6 (six) hours as needed. nausea, Disp: 60 tablet, Rfl: 1 .  warfarin (COUMADIN) 1 MG tablet, Take 1 tablet (1 mg total) by mouth daily., Disp: 90 tablet, Rfl: 3 .  lisinopril (PRINIVIL,ZESTRIL) 10 MG tablet, Take 1 tablet (10 mg total) by mouth daily. (Patient not taking: Reported on 06/23/2017), Disp: 30 tablet, Rfl: 3 .  potassium chloride SA (K-DUR,KLOR-CON) 20 MEQ tablet, 1 pill twice a day (Patient not taking: Reported on 06/23/2017), Disp: 15 tablet, Rfl: 3 No current facility-administered medications for this visit.   Facility-Administered Medications Ordered in Other Visits:  .  heparin lock flush 100 unit/mL, 500 Units, Intravenous, Once, Herring, Orville Govern, NP  Review of Systems  Constitutional: Negative for appetite change, chills, fatigue and fever.  HENT: Positive for congestion.   Respiratory: Positive for cough. Negative for chest tightness and shortness of breath.   Cardiovascular: Negative for chest pain and palpitations.  Gastrointestinal: Negative for abdominal pain, nausea and vomiting.  Neurological: Negative for dizziness and weakness.    Social History   Tobacco Use  . Smoking status: Never Smoker  . Smokeless tobacco: Never Used  Substance Use Topics  . Alcohol use: No   Objective:    Vitals:   06/23/17 1118 06/23/17 1218  BP: 134/80 (!) 142/70  Pulse: 60   Resp: 16   Temp:  17 F (36.7 C)   TempSrc: Oral   SpO2: 97%   Weight: 215 lb (97.5 kg)   Height: 5' (1.524 m)      Physical Exam  General Appearance:    Alert, cooperative, no distress  HENT:   ENT exam normal, no neck nodes or sinus tenderness  Eyes:    PERRL, conjunctiva/corneas clear, EOM's intact       Lungs:     Clear to auscultation bilaterally, respirations unlabored  Heart:    Regular rate and rhythm  Neurologic:   Awake, alert, oriented x 3. No apparent focal neurological           defect.             Assessment & Plan:     1. Essential hypertension Off lisinopril-hctz due to hypokalemia and AKI. Patient stopped lisinopril due to dizziness. Will try low dose amlodipine and follow up in August as already scheduled.   2. Cough No sign of bacterial infection, she did well with hycodan in the past. Refill sent to pharmacy.  - HYDROcodone-homatropine (HYCODAN) 5-1.5 MG/5ML syrup; Take 5 mLs by mouth every 8 (eight) hours as needed.  Dispense: 100 mL; Refill: 0  Call if symptoms change or if not rapidly improving.          Lelon Huh, MD  [facility]

## 2017-06-25 ENCOUNTER — Inpatient Hospital Stay: Payer: Medicare Other | Attending: Internal Medicine

## 2017-06-25 DIAGNOSIS — Z95828 Presence of other vascular implants and grafts: Secondary | ICD-10-CM

## 2017-06-25 DIAGNOSIS — Z85048 Personal history of other malignant neoplasm of rectum, rectosigmoid junction, and anus: Secondary | ICD-10-CM | POA: Diagnosis not present

## 2017-06-25 DIAGNOSIS — Z452 Encounter for adjustment and management of vascular access device: Secondary | ICD-10-CM | POA: Insufficient documentation

## 2017-06-25 MED ORDER — SODIUM CHLORIDE 0.9% FLUSH
10.0000 mL | Freq: Once | INTRAVENOUS | Status: AC
  Administered 2017-06-25: 10 mL via INTRAVENOUS
  Filled 2017-06-25: qty 10

## 2017-06-25 MED ORDER — HEPARIN SOD (PORK) LOCK FLUSH 100 UNIT/ML IV SOLN
500.0000 [IU] | Freq: Once | INTRAVENOUS | Status: AC
  Administered 2017-06-25: 500 [IU] via INTRAVENOUS

## 2017-07-07 ENCOUNTER — Telehealth: Payer: Self-pay

## 2017-07-07 NOTE — Telephone Encounter (Signed)
Called pt to r/s cancelled AWV today. Pt states whomever she spoke with at our clinic told her she did not have to complete the AWV and that Dr Caryn Section would take care of it all together at her CPE. Offered pt the 920 AM apt for the AWV prior to the 10 AM CPE and pt declined. FYI to PCP! -MM

## 2017-08-06 ENCOUNTER — Inpatient Hospital Stay: Payer: Medicare Other | Attending: Internal Medicine

## 2017-08-06 DIAGNOSIS — Z85048 Personal history of other malignant neoplasm of rectum, rectosigmoid junction, and anus: Secondary | ICD-10-CM | POA: Insufficient documentation

## 2017-08-06 DIAGNOSIS — Z452 Encounter for adjustment and management of vascular access device: Secondary | ICD-10-CM | POA: Diagnosis not present

## 2017-08-06 DIAGNOSIS — Z95828 Presence of other vascular implants and grafts: Secondary | ICD-10-CM

## 2017-08-06 MED ORDER — SODIUM CHLORIDE 0.9% FLUSH
10.0000 mL | INTRAVENOUS | Status: DC | PRN
  Administered 2017-08-06: 10 mL via INTRAVENOUS
  Filled 2017-08-06: qty 10

## 2017-08-06 MED ORDER — HEPARIN SOD (PORK) LOCK FLUSH 100 UNIT/ML IV SOLN
500.0000 [IU] | Freq: Once | INTRAVENOUS | Status: AC
  Administered 2017-08-06: 500 [IU] via INTRAVENOUS

## 2017-09-01 ENCOUNTER — Encounter: Payer: Self-pay | Admitting: Family Medicine

## 2017-09-01 ENCOUNTER — Ambulatory Visit: Payer: Self-pay | Admitting: Family Medicine

## 2017-09-16 ENCOUNTER — Other Ambulatory Visit: Payer: Self-pay | Admitting: Family Medicine

## 2017-09-16 DIAGNOSIS — F410 Panic disorder [episodic paroxysmal anxiety] without agoraphobia: Secondary | ICD-10-CM

## 2017-09-17 ENCOUNTER — Inpatient Hospital Stay: Payer: Medicare Other | Attending: Internal Medicine

## 2017-09-17 DIAGNOSIS — Z95828 Presence of other vascular implants and grafts: Secondary | ICD-10-CM

## 2017-09-17 MED ORDER — HEPARIN SOD (PORK) LOCK FLUSH 100 UNIT/ML IV SOLN: 500.0000 [IU] | Freq: Once | INTRAVENOUS | Status: AC

## 2017-09-17 MED ORDER — SODIUM CHLORIDE 0.9% FLUSH
10.0000 mL | INTRAVENOUS | Status: AC | PRN
  Filled 2017-09-17: qty 10

## 2017-09-20 ENCOUNTER — Other Ambulatory Visit: Payer: Self-pay | Admitting: Family Medicine

## 2017-10-11 ENCOUNTER — Other Ambulatory Visit: Payer: Self-pay | Admitting: Family Medicine

## 2017-10-11 DIAGNOSIS — I10 Essential (primary) hypertension: Secondary | ICD-10-CM

## 2017-10-27 ENCOUNTER — Ambulatory Visit (INDEPENDENT_AMBULATORY_CARE_PROVIDER_SITE_OTHER): Payer: Medicare Other | Admitting: Family Medicine

## 2017-10-27 ENCOUNTER — Encounter: Payer: Self-pay | Admitting: Family Medicine

## 2017-10-27 VITALS — BP 138/70 | HR 58 | Temp 97.8°F | Resp 16 | Wt 212.0 lb

## 2017-10-27 DIAGNOSIS — E559 Vitamin D deficiency, unspecified: Secondary | ICD-10-CM

## 2017-10-27 DIAGNOSIS — E119 Type 2 diabetes mellitus without complications: Secondary | ICD-10-CM

## 2017-10-27 DIAGNOSIS — I10 Essential (primary) hypertension: Secondary | ICD-10-CM | POA: Diagnosis not present

## 2017-10-27 DIAGNOSIS — E7800 Pure hypercholesterolemia, unspecified: Secondary | ICD-10-CM | POA: Diagnosis not present

## 2017-10-27 DIAGNOSIS — Q8901 Asplenia (congenital): Secondary | ICD-10-CM

## 2017-10-27 DIAGNOSIS — Z23 Encounter for immunization: Secondary | ICD-10-CM

## 2017-10-27 NOTE — Progress Notes (Signed)
Patient: Danielle Rowland Female    DOB: 01/25/1940   77 y.o.   MRN: [id number] Visit Date: 10/27/2017  Today's Provider: Lelon Huh, MD   Chief Complaint  Patient presents with  . Hypertension   Subjective:    HPI  Hypertension, follow-up:  BP Readings from Last 3 Encounters:  10/27/17 138/70  06/23/17 (!) 142/70  05/19/17 140/78    She was last seen for hypertension 4 months ago.  BP at that visit was 142/70. Management since that visit includes stopping Lisinopril due to dizziness. Started Amlodipine. She reports good compliance with treatment. She is not having side effects.  She is not exercising. She is adherent to low salt diet.   Outside blood pressures are not being checked. She is experiencing none.  Patient denies chest pain, chest pressure/discomfort, claudication, dyspnea, exertional chest pressure/discomfort, fatigue, irregular heart beat, near-syncope, orthopnea, palpitations, paroxysmal nocturnal dyspnea, syncope and tachypnea.   Cardiovascular risk factors include advanced age (older than 18 for men, 67 for women), hypertension and obesity (BMI >= 30 kg/m2).  Use of agents associated with hypertension: none.     Weight trend: fluctuating a bit Wt Readings from Last 3 Encounters:  10/27/17 212 lb (96.2 kg)  06/23/17 215 lb (97.5 kg)  05/19/17 215 lb (97.5 kg)    Current diet: in general, an "unhealthy" diet  ------------------------------------------------------------------------  Diabetes Mellitus Type II, Follow-up:   Lab Results  Component Value Date   HGBA1C 5.9 (H) 03/03/2017   HGBA1C 6.2 07/22/2016   HGBA1C 6.1 (H) 05/08/2016    Last seen for diabetes 8 months ago.  Management since then includes no changes. She reports good compliance with treatment. She is not having side effects.  Current symptoms include none and have been stable. Home blood sugar records: blood sugars are not checked at home  Episodes of  hypoglycemia? no   Current Insulin Regimen: none Most Recent Eye Exam: 1 year ago Weight trend: fluctuating a bit Prior visit with dietician: no Current diet: in general, an "unhealthy" diet Current exercise: none  Pertinent Labs:    Component Value Date/Time   CHOL 188 05/08/2016 0842   TRIG 171 (H) 05/08/2016 0842   HDL 41 05/08/2016 0842   LDLCALC 113 (H) 05/08/2016 0842   CREATININE 0.96 05/19/2017 1128   CREATININE 1.18 02/16/2014 1430    Wt Readings from Last 3 Encounters:  10/27/17 212 lb (96.2 kg)  06/23/17 215 lb (97.5 kg)  05/19/17 215 lb (97.5 kg)    ------------------------------------------------------------------------      Allergies  Allergen Reactions  . Influenza Vaccines   . Iodinated Diagnostic Agents     IVP Dye  . Metformin Hcl Nausea Only and Nausea And Vomiting  . Penicillins   . Sulfa Antibiotics      Current Outpatient Medications:  .  ALPRAZolam (XANAX) 0.25 MG tablet, TAKE 0.5-1 TABLETS (0.125-0.25 MG TOTAL) BY MOUTH 2 (TWO) TIMES DAILY AS NEEDED FOR ANXIETY., Disp: 30 tablet, Rfl: 5 .  amLODipine (NORVASC) 2.5 MG tablet, TAKE 1 TABLET BY MOUTH EVERY DAY, Disp: 90 tablet, Rfl: 4 .  Cholecalciferol (VITAMIN D3)Takes 2 Gummy vitamin D every day .  pioglitazone (ACTOS) 15 MG tablet, TAKE 1 TABLET (15 MG TOTAL) BY MOUTH DAILY., Disp: 30 tablet, Rfl: 12 .  warfarin (COUMADIN) 1 MG tablet, Take 1 tablet (1 mg total) by mouth daily., Disp: 90 tablet, Rfl: 3 .  promethazine (PHENERGAN) 25 MG tablet, Take 1 tablet (25 mg  total) by mouth every 6 (six) hours as needed. nausea (Patient not taking: Reported on 10/27/2017), Disp: 60 tablet, Rfl: 1 No current facility-administered medications for this visit.   Facility-Administered Medications Ordered in Other Visits:  .  heparin lock flush 100 unit/mL, 500 Units, Intravenous, Once, Herring, Orville Govern, NP .  heparin lock flush 100 unit/mL, 500 Units, Intravenous, Once, Finnegan, Kathlene November, MD .  sodium  chloride flush (NS) 0.9 % injection 10 mL, 10 mL, Intravenous, PRN, [place] Ormond, Kathlene November, MD  Review of Systems  Constitutional: Negative for appetite change, chills, fatigue and fever.  Respiratory: Negative for chest tightness and shortness of breath.   Cardiovascular: Positive for leg swelling (in feet). Negative for chest pain and palpitations.  Gastrointestinal: Negative for abdominal pain, nausea and vomiting.  Neurological: Negative for dizziness and weakness.    Social History   Tobacco Use  . Smoking status: Never Smoker  . Smokeless tobacco: Never Used  Substance Use Topics  . Alcohol use: No   Objective:   BP 138/70 (BP Location: Right Wrist, Patient Position: Sitting, Cuff Size: Large)   Pulse (!) 58   Temp 97.8 F (36.6 C) (Oral)   Resp 16   Wt 212 lb (96.2 kg)   SpO2 97% Comment: room air  BMI 41.40 kg/m  Vitals:   10/27/17 0940  BP: 138/70  Pulse: (!) 58  Resp: 16  Temp: 97.8 F (36.6 C)  TempSrc: Oral  SpO2: 97%  Weight: 212 lb (96.2 kg)     Physical Exam   General Appearance:    Alert, cooperative, no distress  Eyes:    PERRL, conjunctiva/corneas clear, EOM's intact       Lungs:     Clear to auscultation bilaterally, respirations unlabored  Heart:    Regular rate and rhythm  Neurologic:   Awake, alert, oriented x 3. No apparent focal neurological           defect.           Assessment & Plan:     1. Essential hypertension Well controlled.  Continue current medications.    2. Type 2 diabetes mellitus without complication, without long-term current use of insulin ([place]) Due for- Hemoglobin A1c  3. Hypercholesteremia .- Lipid panel - Comprehensive metabolic panel  4. Vitamin D deficiency Is currently taking 2 OTC vitamin d supplements daily,  - VITAMIN D 25 Hydroxy (Vit-D Deficiency, Fractures)  5. Asplenia  - Meningococcal MCV4O(Menveo) - Meningococcal B, OMV (Bexsero)  Follow up 6 months if labs normal.       Lelon Huh,  MD  [facility]

## 2017-10-28 LAB — LIPID PANEL
Chol/HDL Ratio: 3.8 ratio (ref 0.0–4.4)
Cholesterol, Total: 162 mg/dL (ref 100–199)
HDL: 43 mg/dL (ref >39)
LDL Calculated: 89 mg/dL (ref 0–99)
TRIGLYCERIDES: 150 mg/dL — AB (ref 0–149)
VLDL CHOLESTEROL CAL: 30 mg/dL (ref 5–40)

## 2017-10-28 LAB — COMPREHENSIVE METABOLIC PANEL
ALBUMIN: 3.7 g/dL (ref 3.5–4.8)
ALK PHOS: 72 IU/L (ref 39–117)
ALT: 9 IU/L (ref 0–32)
AST: 15 IU/L (ref 0–40)
Albumin/Globulin Ratio: 1.4 (ref 1.2–2.2)
BUN / CREAT RATIO: 13 (ref 12–28)
BUN: 12 mg/dL (ref 8–27)
Bilirubin Total: 1.3 mg/dL — ABNORMAL HIGH (ref 0.0–1.2)
CO2: 25 mmol/L (ref 20–29)
Calcium: 9.1 mg/dL (ref 8.7–10.3)
Chloride: 102 mmol/L (ref 96–106)
Creatinine, Ser: 0.93 mg/dL (ref 0.57–1.00)
GFR calc Af Amer: 69 mL/min/{1.73_m2} (ref >59)
GFR calc non Af Amer: 60 mL/min/{1.73_m2} (ref >59)
GLOBULIN, TOTAL: 2.7 g/dL (ref 1.5–4.5)
Glucose: 102 mg/dL — ABNORMAL HIGH (ref 65–99)
Potassium: 3.7 mmol/L (ref 3.5–5.2)
SODIUM: 144 mmol/L (ref 134–144)
Total Protein: 6.4 g/dL (ref 6.0–8.5)

## 2017-10-28 LAB — HEMOGLOBIN A1C
ESTIMATED AVERAGE GLUCOSE: 126 mg/dL
Hgb A1c MFr Bld: 6 % — ABNORMAL HIGH (ref 4.8–5.6)

## 2017-10-28 LAB — VITAMIN D 25 HYDROXY (VIT D DEFICIENCY, FRACTURES): Vit D, 25-Hydroxy: 23.7 ng/mL — ABNORMAL LOW (ref 30.0–100.0)

## 2017-10-29 ENCOUNTER — Inpatient Hospital Stay: Payer: Medicare Other | Attending: Internal Medicine

## 2017-10-29 ENCOUNTER — Telehealth: Payer: Self-pay

## 2017-10-29 DIAGNOSIS — Z85048 Personal history of other malignant neoplasm of rectum, rectosigmoid junction, and anus: Secondary | ICD-10-CM | POA: Insufficient documentation

## 2017-10-29 DIAGNOSIS — Z95828 Presence of other vascular implants and grafts: Secondary | ICD-10-CM

## 2017-10-29 DIAGNOSIS — Z452 Encounter for adjustment and management of vascular access device: Secondary | ICD-10-CM | POA: Diagnosis not present

## 2017-10-29 MED ORDER — HEPARIN SOD (PORK) LOCK FLUSH 100 UNIT/ML IV SOLN
500.0000 [IU] | Freq: Once | INTRAVENOUS | Status: AC
  Administered 2017-10-29: 500 [IU] via INTRAVENOUS

## 2017-10-29 MED ORDER — SODIUM CHLORIDE 0.9% FLUSH
10.0000 mL | Freq: Once | INTRAVENOUS | Status: AC
  Administered 2017-10-29: 10 mL via INTRAVENOUS
  Filled 2017-10-29: qty 10

## 2017-10-29 NOTE — Telephone Encounter (Signed)
Tried calling patient. Left message to call back. 

## 2017-10-29 NOTE — Telephone Encounter (Signed)
-----   Message from Birdie Sons, MD sent at 10/29/2017  1:45 PM EDT ----- Vitamin d level is a little low, need to start OTC vitamin D3 2000 units every day, resto of labs are good. Continue current medications.  Follow up in May as scheduled.

## 2017-10-30 NOTE — Telephone Encounter (Signed)
Patient was advised.,PC 

## 2017-11-04 ENCOUNTER — Other Ambulatory Visit: Payer: Self-pay

## 2017-11-04 NOTE — Patient Outreach (Signed)
[facility]) Care Management  11/04/2017  Danielle Rowland 04-Aug-1940 [id number]   Medication Adherence call to [name] patient's telephone number is disconnected patient is due on Lisinopril 10 mg. Mrd. Duffus is showing past due under [place].   [facility] Direct Dial [phone]  Fax [phone] [email or web address]

## 2017-12-10 ENCOUNTER — Inpatient Hospital Stay: Payer: Medicare Other | Attending: Internal Medicine

## 2017-12-10 DIAGNOSIS — Z85048 Personal history of other malignant neoplasm of rectum, rectosigmoid junction, and anus: Secondary | ICD-10-CM | POA: Diagnosis not present

## 2017-12-10 DIAGNOSIS — Z452 Encounter for adjustment and management of vascular access device: Secondary | ICD-10-CM | POA: Diagnosis not present

## 2017-12-10 DIAGNOSIS — Z95828 Presence of other vascular implants and grafts: Secondary | ICD-10-CM

## 2017-12-10 MED ORDER — HEPARIN SOD (PORK) LOCK FLUSH 100 UNIT/ML IV SOLN
500.0000 [IU] | Freq: Once | INTRAVENOUS | Status: AC
  Administered 2017-12-10: 500 [IU] via INTRAVENOUS

## 2017-12-10 MED ORDER — SODIUM CHLORIDE 0.9% FLUSH
10.0000 mL | Freq: Once | INTRAVENOUS | Status: AC
  Administered 2017-12-10: 10 mL via INTRAVENOUS
  Filled 2017-12-10: qty 10

## 2017-12-19 ENCOUNTER — Other Ambulatory Visit: Payer: Self-pay | Admitting: Family Medicine

## 2017-12-19 DIAGNOSIS — F410 Panic disorder [episodic paroxysmal anxiety] without agoraphobia: Secondary | ICD-10-CM

## 2017-12-19 NOTE — Telephone Encounter (Signed)
please review

## 2017-12-19 NOTE — Telephone Encounter (Signed)
Please advise patient that her pharmacy only has 1mg  alprazolam, which I think is much too strong for her.  She can either call around and see if any other pharmacies have the 0.25mg  tablet, or I could send in prescription for 1mg  and she would have to cut it into quarters or smaller.

## 2017-12-22 NOTE — Telephone Encounter (Signed)
No answer-- unable to leave message.

## 2017-12-24 ENCOUNTER — Telehealth: Payer: Self-pay | Admitting: Family Medicine

## 2017-12-24 DIAGNOSIS — F410 Panic disorder [episodic paroxysmal anxiety] without agoraphobia: Secondary | ICD-10-CM

## 2017-12-24 MED ORDER — ALPRAZOLAM 1 MG PO TABS: ORAL_TABLET | ORAL | 1 refills | Status: DC

## 2017-12-24 NOTE — Telephone Encounter (Signed)
Patient calling stating [facility] [place] [facility] not have Xanax 1 mg. "because there is a shortage".  Patient is asking for something else to be called in to help get her through the holidays and for "flashes".  [facility] [place]

## 2017-12-24 NOTE — Telephone Encounter (Signed)
Tried calling; no answer.  (See note in refill request)  Thanks,   -Mickel Baas

## 2017-12-24 NOTE — Telephone Encounter (Signed)
Tried calling; no answer.   Thanks,   -Julian Medina  

## 2017-12-24 NOTE — Telephone Encounter (Signed)
Patient advised. Patient states she will try to cut the 1 mg tablets in quarters. Patient has called a couple pharmacies and the 1 mg tablets is not available. She states if she is unable to cut them properly she will call more pharmacies and call back to to let Dr. Caryn Section know which pharmacy has them. CB# [phone]

## 2018-01-16 ENCOUNTER — Other Ambulatory Visit: Payer: Self-pay

## 2018-01-21 ENCOUNTER — Inpatient Hospital Stay: Payer: Medicare Other | Attending: Internal Medicine

## 2018-01-21 DIAGNOSIS — Z452 Encounter for adjustment and management of vascular access device: Secondary | ICD-10-CM | POA: Insufficient documentation

## 2018-01-21 DIAGNOSIS — Z85048 Personal history of other malignant neoplasm of rectum, rectosigmoid junction, and anus: Secondary | ICD-10-CM | POA: Diagnosis not present

## 2018-01-21 LAB — COMPREHENSIVE METABOLIC PANEL
ALT: 13 U/L (ref 0–44)
AST: 21 U/L (ref 15–41)
Albumin: 3.4 g/dL — ABNORMAL LOW (ref 3.5–5.0)
Alkaline Phosphatase: 61 U/L (ref 38–126)
Anion gap: 7 (ref 5–15)
BUN: 13 mg/dL (ref 8–23)
CO2: 28 mmol/L (ref 22–32)
Calcium: 8.4 mg/dL — ABNORMAL LOW (ref 8.9–10.3)
Chloride: 106 mmol/L (ref 98–111)
Creatinine, Ser: 0.97 mg/dL (ref 0.44–1.00)
GFR calc Af Amer: >60 mL/min (ref >60)
GFR calc non Af Amer: 57 mL/min — ABNORMAL LOW (ref >60)
Glucose, Bld: 114 mg/dL — ABNORMAL HIGH (ref 70–99)
Potassium: 3.4 mmol/L — ABNORMAL LOW (ref 3.5–5.1)
Sodium: 141 mmol/L (ref 135–145)
Total Bilirubin: 1.6 mg/dL — ABNORMAL HIGH (ref 0.3–1.2)
Total Protein: 6.7 g/dL (ref 6.5–8.1)

## 2018-01-21 LAB — CBC WITH DIFFERENTIAL/PLATELET
Abs Immature Granulocytes: 0.02 10*3/uL (ref 0.00–0.07)
Basophils Absolute: 0.1 10*3/uL (ref 0.0–0.1)
Basophils Relative: 1 %
Eosinophils Absolute: 0.3 10*3/uL (ref 0.0–0.5)
Eosinophils Relative: 4 %
HCT: 42.9 % (ref 36.0–46.0)
Hemoglobin: 14.4 g/dL (ref 12.0–15.0)
Immature Granulocytes: 0 %
LYMPHS PCT: 48 %
Lymphs Abs: 3.8 10*3/uL (ref 0.7–4.0)
MCH: 31.1 pg (ref 26.0–34.0)
MCHC: 33.6 g/dL (ref 30.0–36.0)
MCV: 92.7 fL (ref 80.0–100.0)
Monocytes Absolute: 1.2 10*3/uL — ABNORMAL HIGH (ref 0.1–1.0)
Monocytes Relative: 15 %
Neutro Abs: 2.5 10*3/uL (ref 1.7–7.7)
Neutrophils Relative %: 32 %
Platelets: 297 10*3/uL (ref 150–400)
RBC: 4.63 MIL/uL (ref 3.87–5.11)
RDW: 14.9 % (ref 11.5–15.5)
WBC: 7.9 10*3/uL (ref 4.0–10.5)
nRBC: 0 % (ref 0.0–0.2)

## 2018-01-21 MED ORDER — HEPARIN SOD (PORK) LOCK FLUSH 100 UNIT/ML IV SOLN
500.0000 [IU] | Freq: Once | INTRAVENOUS | Status: AC
  Administered 2018-01-21: 500 [IU] via INTRAVENOUS

## 2018-01-21 MED ORDER — SODIUM CHLORIDE 0.9% FLUSH
10.0000 mL | INTRAVENOUS | Status: AC | PRN
  Administered 2018-01-21: 10 mL via INTRAVENOUS
  Filled 2018-01-21: qty 10

## 2018-01-22 LAB — CEA: CEA: 2 ng/mL (ref 0.0–4.7)

## 2018-01-31 ENCOUNTER — Other Ambulatory Visit: Payer: Self-pay | Admitting: Family Medicine

## 2018-03-04 ENCOUNTER — Inpatient Hospital Stay: Payer: Medicare Other | Attending: Internal Medicine

## 2018-03-04 DIAGNOSIS — Z452 Encounter for adjustment and management of vascular access device: Secondary | ICD-10-CM | POA: Insufficient documentation

## 2018-03-04 DIAGNOSIS — Z85048 Personal history of other malignant neoplasm of rectum, rectosigmoid junction, and anus: Secondary | ICD-10-CM | POA: Diagnosis not present

## 2018-03-04 DIAGNOSIS — Z95828 Presence of other vascular implants and grafts: Secondary | ICD-10-CM

## 2018-03-04 MED ORDER — HEPARIN SOD (PORK) LOCK FLUSH 100 UNIT/ML IV SOLN
500.0000 [IU] | Freq: Once | INTRAVENOUS | Status: AC
  Administered 2018-03-04: 500 [IU] via INTRAVENOUS

## 2018-03-04 MED ORDER — SODIUM CHLORIDE 0.9% FLUSH
10.0000 mL | Freq: Once | INTRAVENOUS | Status: AC
  Administered 2018-03-04: 10 mL via INTRAVENOUS
  Filled 2018-03-04: qty 10

## 2018-03-11 ENCOUNTER — Other Ambulatory Visit: Payer: Self-pay | Admitting: Internal Medicine

## 2018-04-14 ENCOUNTER — Other Ambulatory Visit: Payer: Self-pay

## 2018-04-14 DIAGNOSIS — C9110 Chronic lymphocytic leukemia of B-cell type not having achieved remission: Secondary | ICD-10-CM

## 2018-04-15 ENCOUNTER — Inpatient Hospital Stay: Payer: Medicare Other | Attending: Internal Medicine

## 2018-04-15 ENCOUNTER — Other Ambulatory Visit: Payer: Self-pay

## 2018-04-15 DIAGNOSIS — Z85048 Personal history of other malignant neoplasm of rectum, rectosigmoid junction, and anus: Secondary | ICD-10-CM | POA: Diagnosis not present

## 2018-04-15 DIAGNOSIS — C9110 Chronic lymphocytic leukemia of B-cell type not having achieved remission: Secondary | ICD-10-CM

## 2018-04-15 DIAGNOSIS — Z95828 Presence of other vascular implants and grafts: Secondary | ICD-10-CM

## 2018-04-15 DIAGNOSIS — Z452 Encounter for adjustment and management of vascular access device: Secondary | ICD-10-CM | POA: Diagnosis not present

## 2018-04-15 LAB — CBC WITH DIFFERENTIAL/PLATELET
Abs Immature Granulocytes: 0.02 10*3/uL (ref 0.00–0.07)
Basophils Absolute: 0 10*3/uL (ref 0.0–0.1)
Basophils Relative: 0 %
Eosinophils Absolute: 0.1 10*3/uL (ref 0.0–0.5)
Eosinophils Relative: 1 %
HCT: 44.4 % (ref 36.0–46.0)
Hemoglobin: 15 g/dL (ref 12.0–15.0)
Immature Granulocytes: 0 %
Lymphocytes Relative: 39 %
Lymphs Abs: 3.8 10*3/uL (ref 0.7–4.0)
MCH: 31.6 pg (ref 26.0–34.0)
MCHC: 33.8 g/dL (ref 30.0–36.0)
MCV: 93.7 fL (ref 80.0–100.0)
Monocytes Absolute: 1.5 10*3/uL — ABNORMAL HIGH (ref 0.1–1.0)
Monocytes Relative: 15 %
Neutro Abs: 4.3 10*3/uL (ref 1.7–7.7)
Neutrophils Relative %: 45 %
Platelets: 264 10*3/uL (ref 150–400)
RBC: 4.74 MIL/uL (ref 3.87–5.11)
RDW: 14.5 % (ref 11.5–15.5)
WBC: 9.7 10*3/uL (ref 4.0–10.5)
nRBC: 0 % (ref 0.0–0.2)

## 2018-04-15 LAB — COMPREHENSIVE METABOLIC PANEL
ALT: 17 U/L (ref 0–44)
AST: 23 U/L (ref 15–41)
Albumin: 3.2 g/dL — ABNORMAL LOW (ref 3.5–5.0)
Alkaline Phosphatase: 79 U/L (ref 38–126)
Anion gap: 7 (ref 5–15)
BUN: 12 mg/dL (ref 8–23)
CO2: 28 mmol/L (ref 22–32)
Calcium: 8.2 mg/dL — ABNORMAL LOW (ref 8.9–10.3)
Chloride: 104 mmol/L (ref 98–111)
Creatinine, Ser: 0.89 mg/dL (ref 0.44–1.00)
GFR calc Af Amer: >60 mL/min (ref >60)
GFR calc non Af Amer: >60 mL/min (ref >60)
Glucose, Bld: 97 mg/dL (ref 70–99)
Potassium: 3.2 mmol/L — ABNORMAL LOW (ref 3.5–5.1)
Sodium: 139 mmol/L (ref 135–145)
Total Bilirubin: 1.5 mg/dL — ABNORMAL HIGH (ref 0.3–1.2)
Total Protein: 6.9 g/dL (ref 6.5–8.1)

## 2018-04-15 MED ORDER — HEPARIN SOD (PORK) LOCK FLUSH 100 UNIT/ML IV SOLN
500.0000 [IU] | Freq: Once | INTRAVENOUS | Status: AC
  Administered 2018-04-15: 500 [IU] via INTRAVENOUS

## 2018-04-15 MED ORDER — SODIUM CHLORIDE 0.9% FLUSH
10.0000 mL | Freq: Once | INTRAVENOUS | Status: AC
  Administered 2018-04-15: 10 mL via INTRAVENOUS
  Filled 2018-04-15: qty 10

## 2018-04-16 LAB — CEA: CEA: 2.3 ng/mL (ref 0.0–4.7)

## 2018-05-04 ENCOUNTER — Ambulatory Visit: Payer: Self-pay | Admitting: Family Medicine

## 2018-05-15 ENCOUNTER — Ambulatory Visit: Payer: Medicare Other | Admitting: Internal Medicine

## 2018-05-15 ENCOUNTER — Other Ambulatory Visit: Payer: Medicare Other

## 2018-05-27 ENCOUNTER — Other Ambulatory Visit: Payer: Medicare Other

## 2018-05-27 ENCOUNTER — Ambulatory Visit: Payer: Medicare Other | Admitting: Internal Medicine

## 2018-06-10 ENCOUNTER — Ambulatory Visit: Payer: Medicare Other | Admitting: Internal Medicine

## 2018-06-10 ENCOUNTER — Other Ambulatory Visit: Payer: Medicare Other

## 2018-06-22 ENCOUNTER — Other Ambulatory Visit: Payer: Self-pay | Admitting: Family Medicine

## 2018-06-22 DIAGNOSIS — F410 Panic disorder [episodic paroxysmal anxiety] without agoraphobia: Secondary | ICD-10-CM

## 2018-06-22 MED ORDER — ALPRAZOLAM 1 MG PO TABS: ORAL_TABLET | ORAL | 3 refills | Status: DC

## 2018-06-22 NOTE — Telephone Encounter (Signed)
Pt needs a refill on Alprazolam 1 mg  [facility]  [place]

## 2018-07-14 ENCOUNTER — Inpatient Hospital Stay (HOSPITAL_BASED_OUTPATIENT_CLINIC_OR_DEPARTMENT_OTHER): Payer: Medicare Other | Admitting: Internal Medicine

## 2018-07-14 ENCOUNTER — Other Ambulatory Visit: Payer: Self-pay

## 2018-07-14 ENCOUNTER — Inpatient Hospital Stay: Payer: Medicare Other | Attending: Internal Medicine

## 2018-07-14 DIAGNOSIS — Z95828 Presence of other vascular implants and grafts: Secondary | ICD-10-CM

## 2018-07-14 DIAGNOSIS — Z85048 Personal history of other malignant neoplasm of rectum, rectosigmoid junction, and anus: Secondary | ICD-10-CM | POA: Diagnosis not present

## 2018-07-14 DIAGNOSIS — C20 Malignant neoplasm of rectum: Secondary | ICD-10-CM

## 2018-07-14 DIAGNOSIS — Z452 Encounter for adjustment and management of vascular access device: Secondary | ICD-10-CM | POA: Diagnosis not present

## 2018-07-14 DIAGNOSIS — E876 Hypokalemia: Secondary | ICD-10-CM

## 2018-07-14 MED ORDER — HEPARIN SOD (PORK) LOCK FLUSH 100 UNIT/ML IV SOLN
500.0000 [IU] | Freq: Once | INTRAVENOUS | Status: AC
  Administered 2018-07-14: 500 [IU] via INTRAVENOUS

## 2018-07-14 MED ORDER — PROMETHAZINE HCL 25 MG PO TABS: 25.0000 mg | ORAL_TABLET | Freq: Four times a day (QID) | ORAL | 1 refills | Status: DC | PRN

## 2018-07-14 MED ORDER — SODIUM CHLORIDE 0.9% FLUSH
10.0000 mL | Freq: Once | INTRAVENOUS | Status: AC
  Administered 2018-07-14: 10 mL via INTRAVENOUS
  Filled 2018-07-14: qty 10

## 2018-07-14 NOTE — Assessment & Plan Note (Addendum)
#   Rectal cancer- with recurrence [2005] left pelvic area- status post palliative radiation. Clinically, NED.  But recommend surviellance colonoscopy  [2004-last].stable.   # port flushes every 8 weeks as per patient preference/ coumadin [Hx of DVT]- Stable.   #Low vitamin D/hypocalcemia-recommend continued p.o. calcium/vitamin D.  #Chronic nausea anxiety-continue Phenergan prior to port flush.  #Elevated bilirubin-chronic ?  Gilbert's syndrome.  Stable.  #Hypokalemia chronic-potassium 3.2.  Recommend dietary supplementation.  # DISPOSITION:  # port flushes every 8 weeks [wed;~1:30] # 1 year- MD/labs- cbc/cmp/cea/port flush-Dr.B  Cc; Dr.Fisher
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
## 2018-07-14 NOTE — Progress Notes (Signed)
Patient does not offer any problems today.   Requesting a refill on phenergan.

## 2018-09-08 ENCOUNTER — Other Ambulatory Visit: Payer: Self-pay

## 2018-09-09 ENCOUNTER — Other Ambulatory Visit: Payer: Self-pay

## 2018-09-09 ENCOUNTER — Inpatient Hospital Stay: Payer: Medicare Other | Attending: Internal Medicine

## 2018-09-09 DIAGNOSIS — C20 Malignant neoplasm of rectum: Secondary | ICD-10-CM | POA: Insufficient documentation

## 2018-09-09 DIAGNOSIS — Z452 Encounter for adjustment and management of vascular access device: Secondary | ICD-10-CM | POA: Diagnosis not present

## 2018-09-09 DIAGNOSIS — Z95828 Presence of other vascular implants and grafts: Secondary | ICD-10-CM

## 2018-09-09 MED ORDER — SODIUM CHLORIDE 0.9% FLUSH
10.0000 mL | Freq: Once | INTRAVENOUS | Status: AC
  Administered 2018-09-09: 13:00:00 10 mL via INTRAVENOUS
  Filled 2018-09-09: qty 10

## 2018-09-09 MED ORDER — HEPARIN SOD (PORK) LOCK FLUSH 100 UNIT/ML IV SOLN
500.0000 [IU] | Freq: Once | INTRAVENOUS | Status: AC
  Administered 2018-09-09: 13:00:00 500 [IU] via INTRAVENOUS

## 2018-10-07 ENCOUNTER — Other Ambulatory Visit: Payer: Self-pay | Admitting: Internal Medicine

## 2018-10-07 DIAGNOSIS — Z85048 Personal history of other malignant neoplasm of rectum, rectosigmoid junction, and anus: Secondary | ICD-10-CM

## 2018-10-11 ENCOUNTER — Other Ambulatory Visit: Payer: Self-pay | Admitting: Family Medicine

## 2018-10-11 DIAGNOSIS — I10 Essential (primary) hypertension: Secondary | ICD-10-CM

## 2018-11-03 ENCOUNTER — Other Ambulatory Visit: Payer: Self-pay

## 2018-11-04 ENCOUNTER — Inpatient Hospital Stay: Payer: Medicare Other | Attending: Internal Medicine

## 2018-11-04 ENCOUNTER — Other Ambulatory Visit: Payer: Self-pay

## 2018-11-04 DIAGNOSIS — C20 Malignant neoplasm of rectum: Secondary | ICD-10-CM | POA: Insufficient documentation

## 2018-11-04 DIAGNOSIS — Z452 Encounter for adjustment and management of vascular access device: Secondary | ICD-10-CM | POA: Diagnosis not present

## 2018-11-04 DIAGNOSIS — Z95828 Presence of other vascular implants and grafts: Secondary | ICD-10-CM

## 2018-11-04 MED ORDER — SODIUM CHLORIDE 0.9% FLUSH
10.0000 mL | Freq: Once | INTRAVENOUS | Status: AC
  Administered 2018-11-04: 10 mL via INTRAVENOUS
  Filled 2018-11-04: qty 10

## 2018-11-04 MED ORDER — HEPARIN SOD (PORK) LOCK FLUSH 100 UNIT/ML IV SOLN
500.0000 [IU] | Freq: Once | INTRAVENOUS | Status: AC
  Administered 2018-11-04: 500 [IU] via INTRAVENOUS

## 2018-12-23 ENCOUNTER — Inpatient Hospital Stay
Admission: EM | Admit: 2018-12-23 | Discharge: 2019-01-01 | DRG: 674 | Disposition: A | Discharge status: Skilled Nursing Facility | Payer: Medicare Other | Attending: Internal Medicine | Admitting: Internal Medicine

## 2018-12-23 ENCOUNTER — Emergency Department: Payer: Medicare Other

## 2018-12-23 ENCOUNTER — Other Ambulatory Visit: Payer: Self-pay

## 2018-12-23 DIAGNOSIS — Z79899 Other long term (current) drug therapy: Secondary | ICD-10-CM

## 2018-12-23 DIAGNOSIS — E872 Acidosis: Secondary | ICD-10-CM | POA: Diagnosis not present

## 2018-12-23 DIAGNOSIS — Z923 Personal history of irradiation: Secondary | ICD-10-CM

## 2018-12-23 DIAGNOSIS — F40240 Claustrophobia: Secondary | ICD-10-CM | POA: Diagnosis present

## 2018-12-23 DIAGNOSIS — Z90721 Acquired absence of ovaries, unilateral: Secondary | ICD-10-CM

## 2018-12-23 DIAGNOSIS — Z888 Allergy status to other drugs, medicaments and biological substances status: Secondary | ICD-10-CM | POA: Diagnosis not present

## 2018-12-23 DIAGNOSIS — Z9049 Acquired absence of other specified parts of digestive tract: Secondary | ICD-10-CM | POA: Diagnosis not present

## 2018-12-23 DIAGNOSIS — Z7984 Long term (current) use of oral hypoglycemic drugs: Secondary | ICD-10-CM

## 2018-12-23 DIAGNOSIS — R7989 Other specified abnormal findings of blood chemistry: Secondary | ICD-10-CM | POA: Diagnosis present

## 2018-12-23 DIAGNOSIS — C9110 Chronic lymphocytic leukemia of B-cell type not having achieved remission: Secondary | ICD-10-CM | POA: Diagnosis present

## 2018-12-23 DIAGNOSIS — Z7401 Bed confinement status: Secondary | ICD-10-CM | POA: Diagnosis not present

## 2018-12-23 DIAGNOSIS — E119 Type 2 diabetes mellitus without complications: Secondary | ICD-10-CM

## 2018-12-23 DIAGNOSIS — Z8249 Family history of ischemic heart disease and other diseases of the circulatory system: Secondary | ICD-10-CM

## 2018-12-23 DIAGNOSIS — R600 Localized edema: Secondary | ICD-10-CM | POA: Diagnosis not present

## 2018-12-23 DIAGNOSIS — N179 Acute kidney failure, unspecified: Secondary | ICD-10-CM

## 2018-12-23 DIAGNOSIS — Z887 Allergy status to serum and vaccine status: Secondary | ICD-10-CM

## 2018-12-23 DIAGNOSIS — E875 Hyperkalemia: Secondary | ICD-10-CM | POA: Diagnosis not present

## 2018-12-23 DIAGNOSIS — Z880 Allergy status to penicillin: Secondary | ICD-10-CM | POA: Diagnosis not present

## 2018-12-23 DIAGNOSIS — Z743 Need for continuous supervision: Secondary | ICD-10-CM | POA: Diagnosis not present

## 2018-12-23 DIAGNOSIS — E11649 Type 2 diabetes mellitus with hypoglycemia without coma: Secondary | ICD-10-CM | POA: Diagnosis not present

## 2018-12-23 DIAGNOSIS — Z85048 Personal history of other malignant neoplasm of rectum, rectosigmoid junction, and anus: Secondary | ICD-10-CM

## 2018-12-23 DIAGNOSIS — N1330 Unspecified hydronephrosis: Secondary | ICD-10-CM | POA: Diagnosis not present

## 2018-12-23 DIAGNOSIS — R6520 Severe sepsis without septic shock: Secondary | ICD-10-CM | POA: Diagnosis not present

## 2018-12-23 DIAGNOSIS — A419 Sepsis, unspecified organism: Secondary | ICD-10-CM | POA: Diagnosis present

## 2018-12-23 DIAGNOSIS — R112 Nausea with vomiting, unspecified: Secondary | ICD-10-CM | POA: Diagnosis not present

## 2018-12-23 DIAGNOSIS — Z801 Family history of malignant neoplasm of trachea, bronchus and lung: Secondary | ICD-10-CM

## 2018-12-23 DIAGNOSIS — I4819 Other persistent atrial fibrillation: Secondary | ICD-10-CM | POA: Diagnosis not present

## 2018-12-23 DIAGNOSIS — K7460 Unspecified cirrhosis of liver: Secondary | ICD-10-CM | POA: Diagnosis not present

## 2018-12-23 DIAGNOSIS — R410 Disorientation, unspecified: Secondary | ICD-10-CM | POA: Diagnosis not present

## 2018-12-23 DIAGNOSIS — E7800 Pure hypercholesterolemia, unspecified: Secondary | ICD-10-CM | POA: Diagnosis present

## 2018-12-23 DIAGNOSIS — N170 Acute kidney failure with tubular necrosis: Principal | ICD-10-CM | POA: Diagnosis present

## 2018-12-23 DIAGNOSIS — I82C22 Chronic embolism and thrombosis of left internal jugular vein: Secondary | ICD-10-CM | POA: Diagnosis present

## 2018-12-23 DIAGNOSIS — K7469 Other cirrhosis of liver: Secondary | ICD-10-CM | POA: Diagnosis present

## 2018-12-23 DIAGNOSIS — M7989 Other specified soft tissue disorders: Secondary | ICD-10-CM | POA: Diagnosis present

## 2018-12-23 DIAGNOSIS — E861 Hypovolemia: Secondary | ICD-10-CM | POA: Diagnosis not present

## 2018-12-23 DIAGNOSIS — I10 Essential (primary) hypertension: Secondary | ICD-10-CM | POA: Diagnosis not present

## 2018-12-23 DIAGNOSIS — Z882 Allergy status to sulfonamides status: Secondary | ICD-10-CM | POA: Diagnosis not present

## 2018-12-23 DIAGNOSIS — E860 Dehydration: Secondary | ICD-10-CM | POA: Diagnosis not present

## 2018-12-23 DIAGNOSIS — K529 Noninfective gastroenteritis and colitis, unspecified: Secondary | ICD-10-CM | POA: Diagnosis not present

## 2018-12-23 DIAGNOSIS — I482 Chronic atrial fibrillation, unspecified: Secondary | ICD-10-CM | POA: Diagnosis not present

## 2018-12-23 DIAGNOSIS — I2510 Atherosclerotic heart disease of native coronary artery without angina pectoris: Secondary | ICD-10-CM | POA: Diagnosis present

## 2018-12-23 DIAGNOSIS — Z833 Family history of diabetes mellitus: Secondary | ICD-10-CM

## 2018-12-23 DIAGNOSIS — M2550 Pain in unspecified joint: Secondary | ICD-10-CM | POA: Diagnosis not present

## 2018-12-23 DIAGNOSIS — R531 Weakness: Secondary | ICD-10-CM | POA: Diagnosis not present

## 2018-12-23 DIAGNOSIS — Z823 Family history of stroke: Secondary | ICD-10-CM

## 2018-12-23 DIAGNOSIS — N132 Hydronephrosis with renal and ureteral calculous obstruction: Secondary | ICD-10-CM | POA: Diagnosis not present

## 2018-12-23 DIAGNOSIS — R609 Edema, unspecified: Secondary | ICD-10-CM

## 2018-12-23 DIAGNOSIS — M8580 Other specified disorders of bone density and structure, unspecified site: Secondary | ICD-10-CM | POA: Diagnosis present

## 2018-12-23 DIAGNOSIS — I4891 Unspecified atrial fibrillation: Secondary | ICD-10-CM | POA: Diagnosis not present

## 2018-12-23 DIAGNOSIS — E790 Hyperuricemia without signs of inflammatory arthritis and tophaceous disease: Secondary | ICD-10-CM | POA: Diagnosis present

## 2018-12-23 DIAGNOSIS — Z91041 Radiographic dye allergy status: Secondary | ICD-10-CM | POA: Diagnosis not present

## 2018-12-23 DIAGNOSIS — Z20828 Contact with and (suspected) exposure to other viral communicable diseases: Secondary | ICD-10-CM | POA: Diagnosis present

## 2018-12-23 DIAGNOSIS — Z9081 Acquired absence of spleen: Secondary | ICD-10-CM

## 2018-12-23 DIAGNOSIS — Z66 Do not resuscitate: Secondary | ICD-10-CM | POA: Diagnosis not present

## 2018-12-23 DIAGNOSIS — R778 Other specified abnormalities of plasma proteins: Secondary | ICD-10-CM | POA: Diagnosis present

## 2018-12-23 DIAGNOSIS — R0602 Shortness of breath: Secondary | ICD-10-CM | POA: Diagnosis not present

## 2018-12-23 DIAGNOSIS — Z7901 Long term (current) use of anticoagulants: Secondary | ICD-10-CM

## 2018-12-23 DIAGNOSIS — M6281 Muscle weakness (generalized): Secondary | ICD-10-CM | POA: Diagnosis not present

## 2018-12-23 DIAGNOSIS — R197 Diarrhea, unspecified: Secondary | ICD-10-CM | POA: Diagnosis not present

## 2018-12-23 DIAGNOSIS — N19 Unspecified kidney failure: Secondary | ICD-10-CM | POA: Diagnosis not present

## 2018-12-23 DIAGNOSIS — F419 Anxiety disorder, unspecified: Secondary | ICD-10-CM | POA: Diagnosis present

## 2018-12-23 DIAGNOSIS — Z8042 Family history of malignant neoplasm of prostate: Secondary | ICD-10-CM

## 2018-12-23 DIAGNOSIS — R809 Proteinuria, unspecified: Secondary | ICD-10-CM | POA: Diagnosis not present

## 2018-12-23 LAB — COMPREHENSIVE METABOLIC PANEL
ALT: 31 U/L (ref 0–44)
AST: 21 U/L (ref 15–41)
Albumin: 3 g/dL — ABNORMAL LOW (ref 3.5–5.0)
Alkaline Phosphatase: 164 U/L — ABNORMAL HIGH (ref 38–126)
Anion gap: 31 — ABNORMAL HIGH (ref 5–15)
BUN: 142 mg/dL — ABNORMAL HIGH (ref 8–23)
CO2: 13 mmol/L — ABNORMAL LOW (ref 22–32)
Calcium: 6.9 mg/dL — ABNORMAL LOW (ref 8.9–10.3)
Chloride: 86 mmol/L — ABNORMAL LOW (ref 98–111)
Creatinine, Ser: 15.47 mg/dL — ABNORMAL HIGH (ref 0.44–1.00)
GFR calc Af Amer: 2 mL/min — ABNORMAL LOW (ref >60)
GFR calc non Af Amer: 2 mL/min — ABNORMAL LOW (ref >60)
Glucose, Bld: 100 mg/dL — ABNORMAL HIGH (ref 70–99)
Potassium: 4.7 mmol/L (ref 3.5–5.1)
Sodium: 130 mmol/L — ABNORMAL LOW (ref 135–145)
Total Bilirubin: 2.1 mg/dL — ABNORMAL HIGH (ref 0.3–1.2)
Total Protein: 7.5 g/dL (ref 6.5–8.1)

## 2018-12-23 LAB — CBC WITH DIFFERENTIAL/PLATELET
Abs Immature Granulocytes: 0.2 10*3/uL — ABNORMAL HIGH (ref 0.00–0.07)
Basophils Absolute: 0 10*3/uL (ref 0.0–0.1)
Basophils Relative: 0 %
Eosinophils Absolute: 0 10*3/uL (ref 0.0–0.5)
Eosinophils Relative: 0 %
HCT: 42.5 % (ref 36.0–46.0)
Hemoglobin: 15.2 g/dL — ABNORMAL HIGH (ref 12.0–15.0)
Lymphocytes Relative: 9 %
Lymphs Abs: 1.8 10*3/uL (ref 0.7–4.0)
MCH: 31.3 pg (ref 26.0–34.0)
MCHC: 35.8 g/dL (ref 30.0–36.0)
MCV: 87.4 fL (ref 80.0–100.0)
Monocytes Absolute: 0.4 10*3/uL (ref 0.1–1.0)
Monocytes Relative: 2 %
Myelocytes: 1 %
Neutro Abs: 17.4 10*3/uL — ABNORMAL HIGH (ref 1.7–7.7)
Neutrophils Relative %: 88 %
Platelets: 319 10*3/uL (ref 150–400)
RBC: 4.86 MIL/uL (ref 3.87–5.11)
RDW: 14.2 % (ref 11.5–15.5)
Smear Review: UNDETERMINED
WBC: 19.8 10*3/uL — ABNORMAL HIGH (ref 4.0–10.5)
nRBC: 0.6 % — ABNORMAL HIGH (ref 0.0–0.2)
nRBC: 1 /100 WBC — ABNORMAL HIGH

## 2018-12-23 LAB — MAGNESIUM: Magnesium: 2.3 mg/dL (ref 1.7–2.4)

## 2018-12-23 LAB — PROTIME-INR
INR: 1.3 — ABNORMAL HIGH (ref 0.8–1.2)
Prothrombin Time: 15.8 seconds — ABNORMAL HIGH (ref 11.4–15.2)

## 2018-12-23 LAB — C DIFFICILE QUICK SCREEN W PCR REFLEX
C Diff antigen: NEGATIVE
C Diff interpretation: NOT DETECTED
C Diff toxin: NEGATIVE

## 2018-12-23 LAB — TROPONIN I (HIGH SENSITIVITY)
Troponin I (High Sensitivity): 79 ng/L — ABNORMAL HIGH (ref <18)
Troponin I (High Sensitivity): 69 ng/L — ABNORMAL HIGH (ref <18)

## 2018-12-23 LAB — LACTIC ACID, PLASMA: Lactic Acid, Venous: 2.8 mmol/L (ref 0.5–1.9)

## 2018-12-23 LAB — TSH: TSH: 2.83 u[IU]/mL (ref 0.350–4.500)

## 2018-12-23 MED ORDER — METOPROLOL TARTRATE 25 MG PO TABS
25.0000 mg | ORAL_TABLET | Freq: Once | ORAL | Status: AC
  Administered 2018-12-23: 25 mg via ORAL
  Filled 2018-12-23: qty 1

## 2018-12-23 MED ORDER — LACTATED RINGERS IV BOLUS
1000.0000 mL | Freq: Once | INTRAVENOUS | Status: AC
  Administered 2018-12-23: 1000 mL via INTRAVENOUS

## 2018-12-23 MED ORDER — VANCOMYCIN HCL IN DEXTROSE 1-5 GM/200ML-% IV SOLN
1000.0000 mg | Freq: Once | INTRAVENOUS | Status: AC
  Administered 2018-12-24: 1000 mg via INTRAVENOUS
  Filled 2018-12-23: qty 200

## 2018-12-23 MED ORDER — SODIUM CHLORIDE 0.9 % IV SOLN
2.0000 g | Freq: Once | INTRAVENOUS | Status: AC
  Administered 2018-12-23: 2 g via INTRAVENOUS
  Filled 2018-12-23: qty 2

## 2018-12-23 MED ORDER — VANCOMYCIN HCL IN DEXTROSE 1-5 GM/200ML-% IV SOLN
1000.0000 mg | Freq: Once | INTRAVENOUS | Status: AC
  Administered 2018-12-23: 1000 mg via INTRAVENOUS
  Filled 2018-12-23: qty 200

## 2018-12-23 MED ORDER — ONDANSETRON HCL 4 MG/2ML IJ SOLN
4.0000 mg | Freq: Once | INTRAMUSCULAR | Status: AC
  Administered 2018-12-23: 4 mg via INTRAVENOUS
  Filled 2018-12-23: qty 2

## 2018-12-23 MED ORDER — METRONIDAZOLE IN NACL 5-0.79 MG/ML-% IV SOLN
500.0000 mg | Freq: Once | INTRAVENOUS | Status: AC
  Administered 2018-12-23: 500 mg via INTRAVENOUS
  Filled 2018-12-23: qty 100

## 2018-12-23 MED ORDER — METOPROLOL TARTRATE 5 MG/5ML IV SOLN
5.0000 mg | INTRAVENOUS | Status: DC | PRN
  Administered 2018-12-23: 5 mg via INTRAVENOUS
  Filled 2018-12-23 (×3): qty 5

## 2018-12-23 NOTE — ED Triage Notes (Signed)
Pt in with co vomiting and diarrhea since last Sunday, states improved but diarrhea returned yesterday. Pt co generalized weakness.

## 2018-12-23 NOTE — ED Notes (Signed)
MD states not to wait for second set of cultures to initiate antibiotics.

## 2018-12-23 NOTE — Progress Notes (Signed)
CODE SEPSIS - PHARMACY COMMUNICATION  **Broad Spectrum Antibiotics should be administered within 1 hour of Sepsis diagnosis**  Time Code Sepsis Called/Page Received:   12/9 @ 2130   Antibiotics Ordered:  Aztreonam, Vancomycin   Time of 1st antibiotic administration:  None given as of 12/9 @ 2215   Additional action taken by pharmacy:   Called RN,  RN says they're having difficulty getting IV access but will administer abx as soon as they have good IV line.   If necessary, Name of Provider/Nurse Contacted:     Shivank Pinedo D ,PharmD Clinical Pharmacist  12/23/2018  10:15 PM o

## 2018-12-23 NOTE — ED Notes (Signed)
Unable to start antibiotics at this time because unable to obtain blood cultures. Multiple attempts made to initiate second IV and obtain cultures but was unsuccessful

## 2018-12-23 NOTE — ED Notes (Signed)
US Tech at bedside.

## 2018-12-23 NOTE — Progress Notes (Signed)
PHARMACY -  BRIEF ANTIBIOTIC NOTE   Pharmacy has received consult(s) for Aztreonam, Vancomycin from an ED provider.  The patient's profile has been reviewed for ht/wt/allergies/indication/available labs.    One time order(s) placed for Vancomycin 2 gm IV X 1 and Aztreonam 2 gm IV X 1  Further antibiotics/pharmacy consults should be ordered by admitting physician if indicated.                       Thank you, Quantrell Splitt D 12/23/2018  9:38 PM
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
## 2018-12-24 ENCOUNTER — Encounter: Payer: Self-pay | Admitting: Internal Medicine

## 2018-12-24 ENCOUNTER — Inpatient Hospital Stay: Payer: Medicare Other

## 2018-12-24 DIAGNOSIS — F419 Anxiety disorder, unspecified: Secondary | ICD-10-CM | POA: Diagnosis present

## 2018-12-24 DIAGNOSIS — K529 Noninfective gastroenteritis and colitis, unspecified: Secondary | ICD-10-CM | POA: Diagnosis present

## 2018-12-24 DIAGNOSIS — E119 Type 2 diabetes mellitus without complications: Secondary | ICD-10-CM | POA: Diagnosis not present

## 2018-12-24 DIAGNOSIS — E11649 Type 2 diabetes mellitus with hypoglycemia without coma: Secondary | ICD-10-CM | POA: Diagnosis not present

## 2018-12-24 DIAGNOSIS — Z91041 Radiographic dye allergy status: Secondary | ICD-10-CM | POA: Diagnosis not present

## 2018-12-24 DIAGNOSIS — Z9049 Acquired absence of other specified parts of digestive tract: Secondary | ICD-10-CM | POA: Diagnosis not present

## 2018-12-24 DIAGNOSIS — R6521 Severe sepsis with septic shock: Secondary | ICD-10-CM | POA: Diagnosis present

## 2018-12-24 DIAGNOSIS — M7989 Other specified soft tissue disorders: Secondary | ICD-10-CM | POA: Diagnosis present

## 2018-12-24 DIAGNOSIS — I4819 Other persistent atrial fibrillation: Secondary | ICD-10-CM | POA: Diagnosis not present

## 2018-12-24 DIAGNOSIS — I4891 Unspecified atrial fibrillation: Secondary | ICD-10-CM

## 2018-12-24 DIAGNOSIS — Z20828 Contact with and (suspected) exposure to other viral communicable diseases: Secondary | ICD-10-CM | POA: Diagnosis present

## 2018-12-24 DIAGNOSIS — N1330 Unspecified hydronephrosis: Secondary | ICD-10-CM | POA: Diagnosis present

## 2018-12-24 DIAGNOSIS — R112 Nausea with vomiting, unspecified: Secondary | ICD-10-CM | POA: Diagnosis present

## 2018-12-24 DIAGNOSIS — C9110 Chronic lymphocytic leukemia of B-cell type not having achieved remission: Secondary | ICD-10-CM | POA: Diagnosis present

## 2018-12-24 DIAGNOSIS — Z7901 Long term (current) use of anticoagulants: Secondary | ICD-10-CM | POA: Diagnosis not present

## 2018-12-24 DIAGNOSIS — E872 Acidosis: Secondary | ICD-10-CM | POA: Diagnosis present

## 2018-12-24 DIAGNOSIS — I482 Chronic atrial fibrillation, unspecified: Secondary | ICD-10-CM | POA: Diagnosis present

## 2018-12-24 DIAGNOSIS — Z880 Allergy status to penicillin: Secondary | ICD-10-CM | POA: Diagnosis not present

## 2018-12-24 DIAGNOSIS — E860 Dehydration: Secondary | ICD-10-CM | POA: Diagnosis present

## 2018-12-24 DIAGNOSIS — Z90721 Acquired absence of ovaries, unilateral: Secondary | ICD-10-CM | POA: Diagnosis not present

## 2018-12-24 DIAGNOSIS — I10 Essential (primary) hypertension: Secondary | ICD-10-CM | POA: Diagnosis present

## 2018-12-24 DIAGNOSIS — Z888 Allergy status to other drugs, medicaments and biological substances status: Secondary | ICD-10-CM | POA: Diagnosis not present

## 2018-12-24 DIAGNOSIS — I82C22 Chronic embolism and thrombosis of left internal jugular vein: Secondary | ICD-10-CM | POA: Diagnosis present

## 2018-12-24 DIAGNOSIS — Z887 Allergy status to serum and vaccine status: Secondary | ICD-10-CM | POA: Diagnosis not present

## 2018-12-24 DIAGNOSIS — N170 Acute kidney failure with tubular necrosis: Secondary | ICD-10-CM | POA: Diagnosis present

## 2018-12-24 DIAGNOSIS — Z66 Do not resuscitate: Secondary | ICD-10-CM | POA: Diagnosis present

## 2018-12-24 DIAGNOSIS — Z882 Allergy status to sulfonamides status: Secondary | ICD-10-CM | POA: Diagnosis not present

## 2018-12-24 DIAGNOSIS — R6520 Severe sepsis without septic shock: Secondary | ICD-10-CM | POA: Diagnosis not present

## 2018-12-24 DIAGNOSIS — A419 Sepsis, unspecified organism: Secondary | ICD-10-CM | POA: Diagnosis not present

## 2018-12-24 DIAGNOSIS — R7989 Other specified abnormal findings of blood chemistry: Secondary | ICD-10-CM | POA: Diagnosis present

## 2018-12-24 DIAGNOSIS — R778 Other specified abnormalities of plasma proteins: Secondary | ICD-10-CM | POA: Diagnosis present

## 2018-12-24 DIAGNOSIS — Z85048 Personal history of other malignant neoplasm of rectum, rectosigmoid junction, and anus: Secondary | ICD-10-CM | POA: Diagnosis not present

## 2018-12-24 DIAGNOSIS — N179 Acute kidney failure, unspecified: Secondary | ICD-10-CM

## 2018-12-24 DIAGNOSIS — Z923 Personal history of irradiation: Secondary | ICD-10-CM | POA: Diagnosis not present

## 2018-12-24 LAB — COMPREHENSIVE METABOLIC PANEL
ALT: 27 U/L (ref 0–44)
AST: 22 U/L (ref 15–41)
Albumin: 2.6 g/dL — ABNORMAL LOW (ref 3.5–5.0)
Alkaline Phosphatase: 157 U/L — ABNORMAL HIGH (ref 38–126)
Anion gap: 25 — ABNORMAL HIGH (ref 5–15)
BUN: 140 mg/dL — ABNORMAL HIGH (ref 8–23)
CO2: 14 mmol/L — ABNORMAL LOW (ref 22–32)
Calcium: 6.4 mg/dL — CL (ref 8.9–10.3)
Chloride: 91 mmol/L — ABNORMAL LOW (ref 98–111)
Creatinine, Ser: 13.8 mg/dL — ABNORMAL HIGH (ref 0.44–1.00)
GFR calc Af Amer: 3 mL/min — ABNORMAL LOW (ref >60)
GFR calc non Af Amer: 2 mL/min — ABNORMAL LOW (ref >60)
Glucose, Bld: 84 mg/dL (ref 70–99)
Potassium: 4.5 mmol/L (ref 3.5–5.1)
Sodium: 130 mmol/L — ABNORMAL LOW (ref 135–145)
Total Bilirubin: 1.8 mg/dL — ABNORMAL HIGH (ref 0.3–1.2)
Total Protein: 6.4 g/dL — ABNORMAL LOW (ref 6.5–8.1)

## 2018-12-24 LAB — CBC
HCT: 38.9 % (ref 36.0–46.0)
Hemoglobin: 13.8 g/dL (ref 12.0–15.0)
MCH: 31 pg (ref 26.0–34.0)
MCHC: 35.5 g/dL (ref 30.0–36.0)
MCV: 87.4 fL (ref 80.0–100.0)
Platelets: 277 10*3/uL (ref 150–400)
RBC: 4.45 MIL/uL (ref 3.87–5.11)
RDW: 14.3 % (ref 11.5–15.5)
WBC: 17.4 10*3/uL — ABNORMAL HIGH (ref 4.0–10.5)
nRBC: 0.6 % — ABNORMAL HIGH (ref 0.0–0.2)

## 2018-12-24 LAB — CK: Total CK: 57 U/L (ref 38–234)

## 2018-12-24 LAB — URIC ACID: Uric Acid, Serum: 13.4 mg/dL — ABNORMAL HIGH (ref 2.5–7.1)

## 2018-12-24 LAB — PHOSPHORUS: Phosphorus: 11.7 mg/dL — ABNORMAL HIGH (ref 2.5–4.6)

## 2018-12-24 LAB — HEMOGLOBIN A1C
Hgb A1c MFr Bld: 6.1 % — ABNORMAL HIGH (ref 4.8–5.6)
Mean Plasma Glucose: 128.37 mg/dL

## 2018-12-24 LAB — PROTIME-INR
INR: 1.3 — ABNORMAL HIGH (ref 0.8–1.2)
Prothrombin Time: 16.2 seconds — ABNORMAL HIGH (ref 11.4–15.2)

## 2018-12-24 LAB — SARS CORONAVIRUS 2 (TAT 6-24 HRS): SARS Coronavirus 2: NEGATIVE

## 2018-12-24 LAB — GLUCOSE, CAPILLARY
Glucose-Capillary: 91 mg/dL (ref 70–99)
Glucose-Capillary: 100 mg/dL — ABNORMAL HIGH (ref 70–99)
Glucose-Capillary: 95 mg/dL (ref 70–99)
Glucose-Capillary: 122 mg/dL — ABNORMAL HIGH (ref 70–99)

## 2018-12-24 LAB — LACTIC ACID, PLASMA: Lactic Acid, Venous: 1.7 mmol/L (ref 0.5–1.9)

## 2018-12-24 LAB — CORTISOL-AM, BLOOD: Cortisol - AM: 62.7 ug/dL — ABNORMAL HIGH (ref 6.7–22.6)

## 2018-12-24 LAB — PROCALCITONIN: Procalcitonin: 8.07 ng/mL

## 2018-12-24 MED ORDER — ALPRAZOLAM 0.25 MG PO TABS
0.2500 mg | ORAL_TABLET | Freq: Two times a day (BID) | ORAL | Status: DC | PRN
  Administered 2018-12-24 – 2018-12-30 (×9): 0.25 mg via ORAL
  Filled 2018-12-24 (×9): qty 1

## 2018-12-24 MED ORDER — SODIUM BICARBONATE 8.4 % IV SOLN
125.0000 mL/h | INTRAVENOUS | Status: DC
  Administered 2018-12-24 (×2): 125 mL/h via INTRAVENOUS
  Filled 2018-12-24 (×6): qty 1000

## 2018-12-24 MED ORDER — SODIUM CHLORIDE 0.9 % IV SOLN
INTRAVENOUS | Status: DC
  Administered 2018-12-24: 10:00:00 via INTRAVENOUS

## 2018-12-24 MED ORDER — SODIUM CHLORIDE 0.9 % IV BOLUS
1000.0000 mL | Freq: Once | INTRAVENOUS | Status: AC
  Administered 2018-12-24: 1000 mL via INTRAVENOUS

## 2018-12-24 MED ORDER — WARFARIN SODIUM 2 MG PO TABS
2.0000 mg | ORAL_TABLET | Freq: Once | ORAL | Status: AC
  Administered 2018-12-24: 2 mg via ORAL
  Filled 2018-12-24 (×2): qty 1

## 2018-12-24 MED ORDER — SODIUM CHLORIDE 0.9 % IV BOLUS
500.0000 mL | Freq: Once | INTRAVENOUS | Status: AC
  Administered 2018-12-25: 500 mL via INTRAVENOUS

## 2018-12-24 MED ORDER — WARFARIN - PHARMACIST DOSING INPATIENT
Freq: Every day | Status: DC
  Administered 2018-12-26 – 2018-12-27 (×3)
  Filled 2018-12-24: qty 1

## 2018-12-24 MED ORDER — VITAMIN D3 25 MCG (1000 UNIT) PO TABS
5000.0000 [IU] | ORAL_TABLET | Freq: Every day | ORAL | Status: DC
  Administered 2018-12-24 – 2019-01-01 (×8): 5000 [IU] via ORAL
  Filled 2018-12-24 (×14): qty 5

## 2018-12-24 MED ORDER — WARFARIN SODIUM 1 MG PO TABS: 1.0000 mg | ORAL_TABLET | Freq: Every day | ORAL | Status: DC

## 2018-12-24 MED ORDER — CALCIUM GLUCONATE-NACL 1-0.675 GM/50ML-% IV SOLN
1.0000 g | Freq: Once | INTRAVENOUS | Status: AC
  Administered 2018-12-24: 1000 mg via INTRAVENOUS
  Filled 2018-12-24: qty 50

## 2018-12-24 MED ORDER — INSULIN ASPART 100 UNIT/ML ~~LOC~~ SOLN
0.0000 [IU] | Freq: Three times a day (TID) | SUBCUTANEOUS | Status: DC
  Administered 2018-12-25 (×2): 1 [IU] via SUBCUTANEOUS
  Filled 2018-12-24 (×2): qty 1

## 2018-12-24 MED ORDER — INSULIN ASPART 100 UNIT/ML ~~LOC~~ SOLN
3.0000 [IU] | Freq: Three times a day (TID) | SUBCUTANEOUS | Status: DC
  Administered 2018-12-26: 3 [IU] via SUBCUTANEOUS
  Filled 2018-12-24: qty 1

## 2018-12-24 NOTE — Progress Notes (Signed)
Patient admitted to the hospital earlier this morning by Dr. Damita Dunnings  Patient seen and examined.  She denies any significant abdominal pain, nausea or vomiting.  She is continued to have diarrhea.  She feels generally weak.  Overall, she is anxious and wants to go home.  Abdomen is soft, nontender, nondistended.  Lungs are clear bilaterally.  She has trace pedal edema bilaterally.  Assessment and plan:  1. Sepsis secondary to acute gastroenteritis.  Continue IV hydration.  Stool for C. difficile negative.  GI pathogen panel in process.  Blood culture has been sent.  She is not febrile at this time.  Lactic acidosis is improving with IV hydration. 2. Acute kidney injury.  Likely related to dehydration from gastroenteritis.  Nephrology consulted.  Renal ultrasound does show left-sided hydronephrosis.  Will consult urology.  Continue on IV hydration.  CK levels noted to be normal. 3. Metabolic acidosis.  Suspect is related to GI losses dehydration.  Will change IV fluids to bicarbonate infusion. 4. Elevated troponin.  Mild.  Suspect this is demand ischemia in the setting of acute illness. 5. Type 2 diabetes, non-insulin-dependent, hold oral agents, start sliding scale insulin. 6. Chronic atrial fibrillation.  Presented with rapid ventricular response in the setting of dehydration and acute illness.  She is anticoagulated with Coumadin.  Continue IV hydration which should help her heart rate.  She is not on any chronic rate control medications. 7. Chronic lymphocytic leukemia.  Follow-up with oncology as an outpatient.

## 2018-12-24 NOTE — Progress Notes (Signed)
Provider notified of HR. Will continue to monitor.

## 2018-12-24 NOTE — Progress Notes (Signed)
Called [facility] and they were unable to answer any questions on medications or last doses. Stated that pt uses [facility] and that was all the information they could provide for [place]. I made Hospitalist aware of the issue. Hospitalist instructed to pass to day shift with the possibility of getting the answers needed during business hours.

## 2018-12-24 NOTE — ED Notes (Signed)
Pt ambulated to bedside toilet at this time in attempts to urinate. Pt required 2 person assist to bathroom, no urine output at this time. Pt returned to bed, gown and chux changed at this time

## 2018-12-24 NOTE — Consult Note (Signed)
[facility] for Warfarin Indication: atrial fibrillation  Allergies  Allergen Reactions  . Influenza Vaccines   . Iodinated Diagnostic Agents     IVP Dye  . Metformin Hcl Nausea Only and Nausea And Vomiting  . Penicillins   . Sulfa Antibiotics     Patient Measurements: Height: 5' (152.4 cm) Weight: 200 lb (90.7 kg) IBW/kg (Calculated) : 45.5   Vital Signs: Temp: 98.3 F (36.8 C) (12/10 0733) Temp Source: Oral (12/10 0733) BP: 112/64 (12/10 1401) Pulse Rate: 109 (12/10 1401)  Labs: Recent Labs    12/23/18 2008 12/23/18 2228 12/24/18 0500  HGB 15.2*  --  13.8  HCT 42.5  --  38.9  PLT 319  --  277  LABPROT 15.8*  --  16.2*  INR 1.3*  --  1.3*  CREATININE 15.47*  --  13.80*  CKTOTAL  --   --  57  TROPONINIHS 79* 69*  --     Estimated Creatinine Clearance: 3.4 mL/min (A) (by C-G formula based on SCr of 13.8 mg/dL (H)).   Medications:  Warfarin 1 mg daily- last dose 12/23/18  Assessment: Pharmacy has been consulted for warfarin in patient with a PMH significant for atrial fibrillation.   Date INR  Dose  12/9 1.3 ----- 12/10 1.3  Goal of Therapy:  INR 2-3 Monitor platelets by anticoagulation protocol: Yes   Plan:  1. Will order warfarin 2 mg x1 dose for this evening. Will check INR daily and CBC every 3 days.   Rowland Lathe 12/24/2018,3:40 PM

## 2018-12-24 NOTE — ED Notes (Signed)
Ns bolus completed patient bladder scanned unable to obtain accurate amount of urine in the bladder, md aware.

## 2018-12-24 NOTE — ED Notes (Signed)
Copy of DNR placed in pt file, bracelet placed on right wrist.
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
## 2018-12-24 NOTE — Progress Notes (Signed)
Transferred to room 31 in cpod awaiting admission bed. A/o  1 assist up to [facility] for small loose brown stool. SOB with exertion

## 2018-12-24 NOTE — ED Notes (Signed)
Dialysis ( renal Md) at bedside to assess patient. Patient has not voided all day.

## 2018-12-24 NOTE — ED Notes (Signed)
Date and time results received: 12/24/18 6:22 AM  Test: Calcium Critical Value: 6.4  Name of Provider Notified: Stark Klein NP

## 2018-12-24 NOTE — ED Notes (Signed)
Admit physician at bedside, patient requesting a side of xanax.

## 2018-12-24 NOTE — ED Notes (Addendum)
Assumed care of patient , PATIENT INCONTINENT OF BOWELS LOOSE STOOLS NOTED. VSS, NORMAL SALINE BOLUS COMPLETED. BLADDER SCAN REPORTED TO MD

## 2018-12-24 NOTE — ED Notes (Addendum)
Placed cool compress on right arm, per Pharmacy recommendation, after removing IV from right AC due to infiltration of Vancomycin.
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
## 2018-12-24 NOTE — ED Notes (Signed)
Pt was bladder scanned, 39ml found in bladder. MD notified
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
## 2018-12-24 NOTE — ED Notes (Signed)
Patient asked if she need to void, denied. Patient has not voided even after having 4liters of normal saline boluses. Will continue to monitor.

## 2018-12-24 NOTE — Progress Notes (Signed)
Writer attempted to call report to 1A at 1635. Unit secretary Freda Munro reports charge RN has not approved patients and will call back to ED at 1645.

## 2018-12-25 DIAGNOSIS — E119 Type 2 diabetes mellitus without complications: Secondary | ICD-10-CM

## 2018-12-25 DIAGNOSIS — N1330 Unspecified hydronephrosis: Secondary | ICD-10-CM | POA: Diagnosis present

## 2018-12-25 DIAGNOSIS — K7469 Other cirrhosis of liver: Secondary | ICD-10-CM | POA: Diagnosis present

## 2018-12-25 DIAGNOSIS — I10 Essential (primary) hypertension: Secondary | ICD-10-CM

## 2018-12-25 LAB — PROTEIN ELECTROPHORESIS, SERUM
A/G Ratio: 0.7 (ref 0.7–1.7)
Albumin ELP: 2.3 g/dL — ABNORMAL LOW (ref 2.9–4.4)
Alpha-1-Globulin: 0.4 g/dL (ref 0.0–0.4)
Alpha-2-Globulin: 1.1 g/dL — ABNORMAL HIGH (ref 0.4–1.0)
Beta Globulin: 1.2 g/dL (ref 0.7–1.3)
Gamma Globulin: 0.7 g/dL (ref 0.4–1.8)
Globulin, Total: 3.4 g/dL (ref 2.2–3.9)
Total Protein ELP: 5.7 g/dL — ABNORMAL LOW (ref 6.0–8.5)

## 2018-12-25 LAB — HEPATITIS B SURFACE ANTIGEN: Hepatitis B Surface Ag: NONREACTIVE

## 2018-12-25 LAB — CALCIUM, IONIZED: Calcium, Ionized, Serum: <3 mg/dL — ABNORMAL LOW (ref 4.5–5.6)

## 2018-12-25 LAB — RENAL FUNCTION PANEL
Albumin: 2.5 g/dL — ABNORMAL LOW (ref 3.5–5.0)
Anion gap: 27 — ABNORMAL HIGH (ref 5–15)
BUN: 141 mg/dL — ABNORMAL HIGH (ref 8–23)
CO2: 14 mmol/L — ABNORMAL LOW (ref 22–32)
Calcium: 6.2 mg/dL — CL (ref 8.9–10.3)
Chloride: 89 mmol/L — ABNORMAL LOW (ref 98–111)
Creatinine, Ser: 13.72 mg/dL — ABNORMAL HIGH (ref 0.44–1.00)
GFR calc Af Amer: 3 mL/min — ABNORMAL LOW (ref >60)
GFR calc non Af Amer: 2 mL/min — ABNORMAL LOW (ref >60)
Glucose, Bld: 125 mg/dL — ABNORMAL HIGH (ref 70–99)
Phosphorus: 11.6 mg/dL — ABNORMAL HIGH (ref 2.5–4.6)
Potassium: 4.3 mmol/L (ref 3.5–5.1)
Sodium: 130 mmol/L — ABNORMAL LOW (ref 135–145)

## 2018-12-25 LAB — C3 COMPLEMENT: C3 Complement: 124 mg/dL (ref 82–167)

## 2018-12-25 LAB — GLUCOSE, CAPILLARY
Glucose-Capillary: 127 mg/dL — ABNORMAL HIGH (ref 70–99)
Glucose-Capillary: 121 mg/dL — ABNORMAL HIGH (ref 70–99)
Glucose-Capillary: 128 mg/dL — ABNORMAL HIGH (ref 70–99)
Glucose-Capillary: 125 mg/dL — ABNORMAL HIGH (ref 70–99)

## 2018-12-25 LAB — KAPPA/LAMBDA LIGHT CHAINS
Kappa free light chain: 257 mg/L — ABNORMAL HIGH (ref 3.3–19.4)
Kappa, lambda light chain ratio: 1.59 (ref 0.26–1.65)
Lambda free light chains: 162 mg/L — ABNORMAL HIGH (ref 5.7–26.3)

## 2018-12-25 LAB — ANCA TITERS
Atypical P-ANCA titer: <1:20 {titer}
C-ANCA: <1:20 {titer}
P-ANCA: <1:20 {titer}

## 2018-12-25 LAB — PROTIME-INR
INR: 1.3 — ABNORMAL HIGH (ref 0.8–1.2)
Prothrombin Time: 15.8 seconds — ABNORMAL HIGH (ref 11.4–15.2)

## 2018-12-25 LAB — ANA W/REFLEX IF POSITIVE: Anti Nuclear Antibody (ANA): NEGATIVE

## 2018-12-25 LAB — HEPATITIS B SURFACE ANTIBODY,QUALITATIVE: Hep B S Ab: NONREACTIVE

## 2018-12-25 LAB — C4 COMPLEMENT: Complement C4, Body Fluid: 28 mg/dL (ref 12–38)

## 2018-12-25 MED ORDER — SODIUM BICARBONATE-DEXTROSE 150-5 MEQ/L-% IV SOLN
150.0000 meq | INTRAVENOUS | Status: DC
  Administered 2018-12-25 – 2018-12-26 (×2): 150 meq via INTRAVENOUS
  Filled 2018-12-25 (×3): qty 1000

## 2018-12-25 MED ORDER — WARFARIN SODIUM 2 MG PO TABS
2.0000 mg | ORAL_TABLET | Freq: Once | ORAL | Status: AC
  Administered 2018-12-25: 2 mg via ORAL
  Filled 2018-12-25: qty 1

## 2018-12-25 MED ORDER — METOPROLOL TARTRATE 25 MG PO TABS
12.5000 mg | ORAL_TABLET | Freq: Two times a day (BID) | ORAL | Status: DC
  Administered 2018-12-25 – 2018-12-27 (×5): 12.5 mg via ORAL
  Filled 2018-12-25 (×6): qty 1

## 2018-12-25 MED ORDER — SODIUM CHLORIDE 0.9 % IV BOLUS: 1000.0000 mL | Freq: Once | INTRAVENOUS | Status: DC

## 2018-12-25 MED ORDER — CHLORHEXIDINE GLUCONATE CLOTH 2 % EX PADS
6.0000 | MEDICATED_PAD | Freq: Every day | CUTANEOUS | Status: DC
  Administered 2018-12-25 – 2018-12-31 (×6): 6 via TOPICAL

## 2018-12-25 MED ORDER — SODIUM BICARBONATE-DEXTROSE 150-5 MEQ/L-% IV SOLN
150.0000 meq | INTRAVENOUS | Status: DC
  Administered 2018-12-25: 150 meq via INTRAVENOUS
  Filled 2018-12-25 (×3): qty 1000

## 2018-12-25 MED ORDER — ACETAMINOPHEN 325 MG PO TABS
650.0000 mg | ORAL_TABLET | Freq: Four times a day (QID) | ORAL | Status: DC | PRN
  Administered 2018-12-25 – 2019-01-01 (×4): 650 mg via ORAL
  Filled 2018-12-25 (×4): qty 2

## 2018-12-25 NOTE — Progress Notes (Signed)
PROGRESS NOTE    Danielle Rowland  DQQ:[id number] DOB: Jun 24, 1940 DOA: 12/23/2018 PCP: Birdie Sons, MD    Brief Narrative:  78 y/o female with history of diabetes, A fib on coumadin, HTN and CLL, presents with 4 day history of diarrhea and vomiting. She was noted to be in acute renal failure with creatinine of 13, metabolic acidosis and elevated wbc count. CT abdomen shows evidence of colitis. C diff was found to be negative. Nephrology following for renal failure.   Assessment & Plan:   Principal Problem:   Sepsis ([place]) Active Problems:   CLL (chronic lymphocytic leukemia) (H[facility])   Non-insulin dependent type 2 diabetes mellitus (H[facility])   AKI (acute kidney injury) ([place])   Acute gastroenteritis   Elevated troponin   Essential hypertension   Atrial fibrillation, chronic ([place])   1. Sepsis secondary to acute colitis.  Continue IV hydration.  Stool for C. difficile negative.  GI pathogen panel in process.  Blood culture has shown no growth. Overall diarrhea improving.  She is not febrile at this time.  Lactic acidosis is improving with IV hydration. 2. Acute kidney injury.  Likely related to dehydration from diarrhea/colitis.  Nephrology consulted.  Renal ultrasound showed left-sided hydronephrosis, but this was a chronic finding.  Seen by urology and stent placement was not felt to be indicated.  Continue on IV hydration.  CK levels noted to be normal. She has not had any urine output. 3. Metabolic acidosis.  Suspect is related to GI losses dehydration.  Continue on bicarbonate infusion. 4. Elevated troponin.  Mild.  Suspect this is demand ischemia in the setting of acute illness. 5. Type 2 diabetes, non-insulin-dependent, hold oral agents, blood sugars are stable on sliding scale insulin. 6. Chronic atrial fibrillation.  Presented with rapid ventricular response in the setting of dehydration and acute illness.  She is anticoagulated with Coumadin.  Continue IV hydration which should  help her heart rate.  She is not on any chronic rate control medications. Will start low dose metoprolol 7. Chronic lymphocytic leukemia.  Follow-up with oncology as an outpatient.   DVT prophylaxis: coumadin Code Status: DNR Family Communication: discussed with sister over the phone on 12/10 Disposition Plan: pending hospital course   Consultants:   Nephrology  Urology  Procedures:     Antimicrobials:       Subjective: No further diarrhea, no vomiting. No urine output reported overnight. No shortness of breath  Objective: Vitals:   12/24/18 2118 12/24/18 2255 12/25/18 0156 12/25/18 0823  BP: 107/76 113/85 122/78 (!) 125/92  Pulse: (!) 109 (!) 128 (!) 104 100  Resp: 20 20  20   Temp: (!) 97.4 F (36.3 C) (!) 97.5 F (36.4 C)  98 F (36.7 C)  TempSrc: Oral Oral    SpO2: 98% 98% 95% 96%  Weight:      Height:        Intake/Output Summary (Last 24 hours) at 12/25/2018 0929 Last data filed at 12/25/2018 0424 Gross per 24 hour  Intake 2697.57 ml  Output 0 ml  Net 2697.57 ml   Filed Weights   12/23/18 1943  Weight: 90.7 kg    Examination:  General exam: Appears calm and comfortable  Respiratory system: Clear to auscultation. Respiratory effort normal. Cardiovascular system: S1 & S2 heard, RRR. No JVD, murmurs, rubs, gallops or clicks.  Gastrointestinal system: Abdomen is nondistended, soft and nontender. No organomegaly or masses felt. Normal bowel sounds heard. Central nervous system: Alert and oriented. No focal neurological  deficits. Extremities: 1+ edema bilaterally Skin: No rashes, lesions or ulcers Psychiatry: Judgement and insight appear normal. Mood & affect appropriate.     Data Reviewed: I have personally reviewed following labs and imaging studies  CBC: Recent Labs  Lab 12/23/18 2008 12/24/18 0500  WBC 19.8* 17.4*  NEUTROABS 17.4*  --   HGB 15.2* 13.8  HCT 42.5 38.9  MCV 87.4 87.4  PLT 319 109   Basic Metabolic Panel: Recent Labs    Lab 12/23/18 2008 12/24/18 0500 12/25/18 0019  NA 130* 130* 130*  K 4.7 4.5 4.3  CL 86* 91* 89*  CO2 13* 14* 14*  GLUCOSE 100* 84 125*  BUN 142* 140* 141*  CREATININE 15.47* 13.80* 13.72*  CALCIUM 6.9* 6.4* 6.2*  MG 2.3  --   --   PHOS  --  11.7* 11.6*   GFR: Estimated Creatinine Clearance: 3.4 mL/min (A) (by C-G formula based on SCr of 13.72 mg/dL (H)). Liver Function Tests: Recent Labs  Lab 12/23/18 2008 12/24/18 0500 12/25/18 0019  AST 21 22  --   ALT 31 27  --   ALKPHOS 164* 157*  --   BILITOT 2.1* 1.8*  --   PROT 7.5 6.4*  --   ALBUMIN 3.0* 2.6* 2.5*   No results for input(s): LIPASE, AMYLASE in the last 168 hours. No results for input(s): AMMONIA in the last 168 hours. Coagulation Profile: Recent Labs  Lab 12/23/18 2008 12/24/18 0500 12/25/18 0557  INR 1.3* 1.3* 1.3*   Cardiac Enzymes: Recent Labs  Lab 12/24/18 0500  CKTOTAL 57   BNP (last 3 results) No results for input(s): PROBNP in the last 8760 hours. HbA1C: Recent Labs    12/24/18 0500  HGBA1C 6.1*   CBG: Recent Labs  Lab 12/24/18 0803 12/24/18 1153 12/24/18 1825 12/24/18 2127 12/25/18 0822  GLUCAP 91 100* 95 122* 127*   Lipid Profile: No results for input(s): CHOL, HDL, LDLCALC, TRIG, CHOLHDL, LDLDIRECT in the last 72 hours. Thyroid Function Tests: Recent Labs    12/23/18 2008  TSH 2.830   Anemia Panel: No results for input(s): VITAMINB12, FOLATE, FERRITIN, TIBC, IRON, RETICCTPCT in the last 72 hours. Sepsis Labs: Recent Labs  Lab 12/23/18 2007 12/24/18 0007 12/24/18 0500  PROCALCITON  --   --  8.07  LATICACIDVEN 2.8* 1.7  --     Recent Results (from the past 240 hour(s))  C Difficile Quick Screen w PCR reflex     Status: None   Collection Time: 12/23/18 10:28 PM   Specimen: STOOL  Result Value Ref Range Status   C Diff antigen NEGATIVE NEGATIVE Final   C Diff toxin NEGATIVE NEGATIVE Final   C Diff interpretation No C. difficile detected.  Final    Comment:  Performed at [facility], [address] [place], [place] [address]  Urine culture     Status: None (Preliminary result)   Collection Time: 12/23/18 10:28 PM   Specimen: In/Out Cath Urine  Result Value Ref Range Status   Specimen Description   Final    IN/OUT CATH URINE Performed at [facility], [address] [place], [place] [address]    Special Requests   Final    NONE Performed at [facility], [address], [place], [place] [address]    Culture   Final    CULTURE REINCUBATED FOR BETTER GROWTH Performed at [facility], [place] [address], [place], [place] [address]    Report Status PENDING  Incomplete  Blood Culture (routine x 2)     Status: None (Preliminary result)   Collection Time: 12/23/18 10:29 PM   Specimen: BLOOD  Result Value Ref Range Status   Specimen Description BLOOD RIGHT ANTECUBITAL  Final   Special Requests   Final    BOTTLES DRAWN AEROBIC AND ANAEROBIC Blood Culture results may not be optimal due to an excessive volume of blood received in culture bottles   Culture   Final    NO GROWTH 2 DAYS Performed at [facility], [address] [place], [place] [address]    Report Status PENDING  Incomplete  SARS CORONAVIRUS 2 (TAT 6-24 HRS) Nasopharyngeal Nasopharyngeal Swab     Status: None   Collection Time: 12/24/18 12:07 AM   Specimen: Nasopharyngeal Swab  Result Value Ref Range Status   SARS Coronavirus 2 NEGATIVE NEGATIVE Final    Comment: (NOTE) SARS-CoV-2 target nucleic acids are NOT DETECTED. The SARS-CoV-2 RNA is generally detectable in upper and lower respiratory specimens during the acute phase of infection. Negative results do not preclude SARS-CoV-2 infection, do not rule out co-infections with other pathogens, and should not be used as the sole basis for treatment or other patient management decisions. Negative results must be combined with clinical observations, patient history, and epidemiological  information. The expected result is Negative. Fact Sheet for Patients: SugarRoll.be Fact Sheet for Healthcare Providers: https://www.woods-mathews.com/ This test is not yet approved or cleared by the [place] FDA and  has been authorized for detection and/or diagnosis of SARS-CoV-2 by FDA under an Emergency Use Authorization (EUA). This EUA will remain  in effect (meaning this test can be used) for the duration of the COVID-19 declaration under Section 56 4(b)(1) of the Act, 21 U.S.C. section 360bbb-3(b)(1), unless the authorization is terminated or revoked sooner. Performed at [facility], [place] [address], [place], [place] [address]          Radiology Studies: US Renal  Result Date: 12/23/2018 CLINICAL DATA:  Renal failure EXAM: RENAL / URINARY TRACT ULTRASOUND COMPLETE COMPARISON:  CT from 03/23/2013 FINDINGS: Right Kidney: Renal measurements: 11.1 x 6.4 x 4.8 cm. = volume: 176 mL. Diffuse increased echogenicity is noted. Mild cortical thinning is noted. No obstructive changes are noted. Left Kidney: Renal measurements: 4 x 5.4 x 4.7 cm. = volume: 125 mL. Severe hydronephrosis and renal pelvic dilatation is noted similar to that seen on the prior exam. Severe cortical thinning is noted stable from the prior CT examination. Bladder: The bladder is decompressed. Other: None. IMPRESSION: Significant cortical thinning within the left kidney related to a severe degree of hydronephrosis which is longstanding and stable from the prior CT examination. Increased echogenicity within the right kidney consistent with medical renal disease. Electronically Signed   By: Inez Catalina M.D.   On: 12/23/2018 23:53   DG Chest Portable 1 View  Result Date: 12/23/2018 CLINICAL DATA:  Short of breath EXAM: PORTABLE CHEST 1 VIEW COMPARISON:  09/23/2005 FINDINGS: Cardiac enlargement. Negative for edema or effusion. Negative for pneumonia. Atherosclerotic  aortic arch. Port-A-Cath tip in the SVC. IMPRESSION: No active disease. Electronically Signed   By: Franchot Gallo M.D.   On: 12/23/2018 20:30   CT RENAL STONE STUDY  Result Date: 12/24/2018 CLINICAL DATA:  78 year old female with history of flank pain. Suspected nephrolithiasis. EXAM: CT ABDOMEN AND PELVIS WITHOUT CONTRAST TECHNIQUE: Multidetector CT imaging of the abdomen and pelvis was performed following the standard protocol without IV contrast. COMPARISON:  CT the abdomen and  pelvis 03/23/2013. FINDINGS: Lower chest: Cardiomegaly. Calcifications of the inferior mitral annulus. Atherosclerotic calcifications in the descending thoracic aorta as well as the left circumflex and right coronary arteries. Central venous catheter tip in the right atrium. Hepatobiliary: Liver has a shrunken appearance and nodular contour, indicative of underlying cirrhosis. No discrete cystic or solid hepatic lesions are confidently identified on today's noncontrast CT examination. Status post cholecystectomy. Pancreas: No definite pancreatic mass or peripancreatic fluid collections or inflammatory changes noted on today's noncontrast CT examination. Spleen: Status post splenectomy. Adrenals/Urinary Tract: Severe chronic left-sided hydroureteronephrosis, similar to the prior study. On today's examination (axial image 57 of series 2) there is a 6 mm calculus at the junction of middle and distal thirds of the left ureter. Severe atrophy of the left renal parenchyma. Unenhanced appearance of the right kidney is unremarkable. No right hydroureteronephrosis. Urinary bladder is nearly decompressed, but otherwise unremarkable in appearance. Stomach/Bowel: Normal appearance of the stomach. No pathologic dilatation of small bowel or colon. Suture line at the rectosigmoid junction from prior partial colorectal resection. Large ventral hernia containing multiple loops of small bowel, similar to the prior examination. Appendix is not  confidently identified. There are areas of mural thickening in the cecum and ascending colon (axial image 65 and axial image 53 of series 2 respectively), which could reflect regions of colitis. Vascular/Lymphatic: Aortic atherosclerosis. No lymphadenopathy is noted in the abdomen or pelvis. Reproductive: Uterus and ovaries are a trophic. Other: No significant volume of ascites.  No pneumoperitoneum. Musculoskeletal: There are no aggressive appearing lytic or blastic lesions noted in the visualized portions of the skeleton. IMPRESSION: 1. 6 mm calculus at the junction of middle and distal thirds of the left ureter is new compared to the prior study, with severe proximal chronic left-sided hydroureteronephrosis and severe atrophy of the left kidney which otherwise appears similar to the prior examination. 2. Focal areas of mural thickening involving the cecum and ascending colon, concerning for potential regions of colitis. 3. Cirrhosis. 4. Status post cholecystectomy. 5. Aortic atherosclerosis, in addition to least 2 vessel coronary artery disease. Assessment for potential risk factor modification, dietary therapy or pharmacologic therapy may be warranted, if clinically indicated. 6. Additional incidental findings, as above. Electronically Signed   By: Vinnie Langton M.D.   On: 12/24/2018 10:48        Scheduled Meds: . cholecalciferol  5,000 Units Oral Daily  . insulin aspart  0-9 Units Subcutaneous TID WC  . insulin aspart  3 Units Subcutaneous TID WC  . Warfarin - Pharmacist Dosing Inpatient   Does not apply q1800   Continuous Infusions: . sodium bicarbonate 150 mEq in dextrose 5% 1000 mL 150 mEq (12/25/18 0906)     LOS: 1 day    Time spent: 53mins    Kathie Dike, MD [facility]   If 7PM-7AM, please contact night-coverage [email or web address]  12/25/2018, 9:29 AM

## 2018-12-25 NOTE — Progress Notes (Signed)
Pt has produced zero urinary output for this shift so far. Bladder scan results yielded 115cc. Provider notified. Will continue to monitor.

## 2018-12-25 NOTE — Progress Notes (Signed)
[place], [place] 12/25/18  Subjective:   Hospital day # 1  Neuro: No complaints cvs: Denies any chest pain or shortness of breath pulm: No cough or sputum production gi: Appetite is poor but able to tolerate clears.  No nausea or vomiting Renal: Urine output remains poor 12/10 0701 - 12/11 0700 In: 4697.6 [I.V.:2097.2; IV Piggyback:2600.4] Out: 0  Lab Results  Component Value Date   CREATININE 13.72 (H) 12/25/2018   CREATININE 13.80 (H) 12/24/2018   CREATININE 15.47 (H) 12/23/2018     Objective:  Vital signs in last 24 hours:  Temp:  [97.4 F (36.3 C)-98 F (36.7 C)] 98 F (36.7 C) (12/11 0823) Pulse Rate:  [100-128] 100 (12/11 0823) Resp:  [18-30] 20 (12/11 0823) BP: (101-144)/(64-94) 125/92 (12/11 0823) SpO2:  [93 %-98 %] 96 % (12/11 0823)  Weight change:  Filed Weights   12/23/18 1943  Weight: 90.7 kg    Intake/Output:    Intake/Output Summary (Last 24 hours) at 12/25/2018 1051 Last data filed at 12/25/2018 0424 Gross per 24 hour  Intake 2697.57 ml  Output 0 ml  Net 2697.57 ml   Physical Exam: General:  Chronically ill-appearing, frail, laying in the bed  HEENT  moist oral mucous membranes  Lungs: Room air, clear to auscultation  Heart::  Irregular, atrial fibrillation  Abdomen:  Soft, nontender, previous surgical scar  Extremities:  Trace to 1+ edema  Neurologic:  Alert, able to follow commands  Skin:  No acute rashes  Access: Rt IJ port       Basic Metabolic Panel:  Recent Labs  Lab 12/23/18 2008 12/24/18 0500 12/25/18 0019  NA 130* 130* 130*  K 4.7 4.5 4.3  CL 86* 91* 89*  CO2 13* 14* 14*  GLUCOSE 100* 84 125*  BUN 142* 140* 141*  CREATININE 15.47* 13.80* 13.72*  CALCIUM 6.9* 6.4* 6.2*  MG 2.3  --   --   PHOS  --  11.7* 11.6*     CBC: Recent Labs  Lab 12/23/18 2008 12/24/18 0500  WBC 19.8* 17.4*  NEUTROABS 17.4*  --   HGB 15.2* 13.8  HCT 42.5 38.9  MCV 87.4 87.4  PLT 319 277     No  results found for: HEPBSAG, HEPBSAB, HEPBIGM    Microbiology:  Recent Results (from the past 240 hour(s))  C Difficile Quick Screen w PCR reflex     Status: None   Collection Time: 12/23/18 10:28 PM   Specimen: STOOL  Result Value Ref Range Status   C Diff antigen NEGATIVE NEGATIVE Final   C Diff toxin NEGATIVE NEGATIVE Final   C Diff interpretation No C. difficile detected.  Final    Comment: Performed at [facility], [address] [place], [place] [address]  Urine culture     Status: None (Preliminary result)   Collection Time: 12/23/18 10:28 PM   Specimen: In/Out Cath Urine  Result Value Ref Range Status   Specimen Description   Final    IN/OUT CATH URINE Performed at [facility], [address] [place], [place] [address]    Special Requests   Final    NONE Performed at [facility], [address], [place], [place] [address]    Culture   Final    CULTURE REINCUBATED FOR BETTER GROWTH Performed at [facility], [place] [address], [place], [place] [address]    Report Status PENDING  Incomplete  Blood Culture (routine x 2)  Status: None (Preliminary result)   Collection Time: 12/23/18 10:29 PM   Specimen: BLOOD  Result Value Ref Range Status   Specimen Description BLOOD RIGHT ANTECUBITAL  Final   Special Requests   Final    BOTTLES DRAWN AEROBIC AND ANAEROBIC Blood Culture results may not be optimal due to an excessive volume of blood received in culture bottles   Culture   Final    NO GROWTH 2 DAYS Performed at [facility], [address] [place], [place] [address]    Report Status PENDING  Incomplete  SARS CORONAVIRUS 2 (TAT 6-24 HRS) Nasopharyngeal Nasopharyngeal Swab     Status: None   Collection Time: 12/24/18 12:07 AM   Specimen: Nasopharyngeal Swab  Result Value Ref Range Status   SARS Coronavirus 2 NEGATIVE NEGATIVE Final    Comment: (NOTE) SARS-CoV-2 target nucleic acids are NOT DETECTED. The SARS-CoV-2  RNA is generally detectable in upper and lower respiratory specimens during the acute phase of infection. Negative results do not preclude SARS-CoV-2 infection, do not rule out co-infections with other pathogens, and should not be used as the sole basis for treatment or other patient management decisions. Negative results must be combined with clinical observations, patient history, and epidemiological information. The expected result is Negative. Fact Sheet for Patients: SugarRoll.be Fact Sheet for Healthcare Providers: https://www.woods-mathews.com/ This test is not yet approved or cleared by the [place] FDA and  has been authorized for detection and/or diagnosis of SARS-CoV-2 by FDA under an Emergency Use Authorization (EUA). This EUA will remain  in effect (meaning this test can be used) for the duration of the COVID-19 declaration under Section 56 4(b)(1) of the Act, 21 U.S.C. section 360bbb-3(b)(1), unless the authorization is terminated or revoked sooner. Performed at [facility], [place] [address], [place], [place] [address]     Coagulation Studies: Recent Labs    12/23/18 2006-04-20 12/24/18 0500 12/25/18 0557  LABPROT 15.8* 16.2* 15.8*  INR 1.3* 1.3* 1.3*    Urinalysis: No results for input(s): COLORURINE, LABSPEC, PHURINE, GLUCOSEU, HGBUR, BILIRUBINUR, KETONESUR, PROTEINUR, UROBILINOGEN, NITRITE, LEUKOCYTESUR in the last 72 hours.  Invalid input(s): APPERANCEUR    Imaging: US Renal  Result Date: 12/23/2018 CLINICAL DATA:  Renal failure EXAM: RENAL / URINARY TRACT ULTRASOUND COMPLETE COMPARISON:  CT from 03/23/2013 FINDINGS: Right Kidney: Renal measurements: 11.1 x 6.4 x 4.8 cm. = volume: 176 mL. Diffuse increased echogenicity is noted. Mild cortical thinning is noted. No obstructive changes are noted. Left Kidney: Renal measurements: 4 x 5.4 x 4.7 cm. = volume: 125 mL. Severe hydronephrosis and renal pelvic dilatation  is noted similar to that seen on the prior exam. Severe cortical thinning is noted stable from the prior CT examination. Bladder: The bladder is decompressed. Other: None. IMPRESSION: Significant cortical thinning within the left kidney related to a severe degree of hydronephrosis which is longstanding and stable from the prior CT examination. Increased echogenicity within the right kidney consistent with medical renal disease. Electronically Signed   By: Inez Catalina M.D.   On: 12/23/2018 23:53   DG Chest Portable 1 View  Result Date: 12/23/2018 CLINICAL DATA:  Short of breath EXAM: PORTABLE CHEST 1 VIEW COMPARISON:  09/23/2005 FINDINGS: Cardiac enlargement. Negative for edema or effusion. Negative for pneumonia. Atherosclerotic aortic arch. Port-A-Cath tip in the SVC. IMPRESSION: No active disease. Electronically Signed   By: Franchot Gallo M.D.   On: 12/23/2018 20:30   CT RENAL STONE STUDY  Result Date: 12/24/2018 CLINICAL DATA:  78 year old  female with history of flank pain. Suspected nephrolithiasis. EXAM: CT ABDOMEN AND PELVIS WITHOUT CONTRAST TECHNIQUE: Multidetector CT imaging of the abdomen and pelvis was performed following the standard protocol without IV contrast. COMPARISON:  CT the abdomen and pelvis 03/23/2013. FINDINGS: Lower chest: Cardiomegaly. Calcifications of the inferior mitral annulus. Atherosclerotic calcifications in the descending thoracic aorta as well as the left circumflex and right coronary arteries. Central venous catheter tip in the right atrium. Hepatobiliary: Liver has a shrunken appearance and nodular contour, indicative of underlying cirrhosis. No discrete cystic or solid hepatic lesions are confidently identified on today's noncontrast CT examination. Status post cholecystectomy. Pancreas: No definite pancreatic mass or peripancreatic fluid collections or inflammatory changes noted on today's noncontrast CT examination. Spleen: Status post splenectomy. Adrenals/Urinary  Tract: Severe chronic left-sided hydroureteronephrosis, similar to the prior study. On today's examination (axial image 57 of series 2) there is a 6 mm calculus at the junction of middle and distal thirds of the left ureter. Severe atrophy of the left renal parenchyma. Unenhanced appearance of the right kidney is unremarkable. No right hydroureteronephrosis. Urinary bladder is nearly decompressed, but otherwise unremarkable in appearance. Stomach/Bowel: Normal appearance of the stomach. No pathologic dilatation of small bowel or colon. Suture line at the rectosigmoid junction from prior partial colorectal resection. Large ventral hernia containing multiple loops of small bowel, similar to the prior examination. Appendix is not confidently identified. There are areas of mural thickening in the cecum and ascending colon (axial image 65 and axial image 53 of series 2 respectively), which could reflect regions of colitis. Vascular/Lymphatic: Aortic atherosclerosis. No lymphadenopathy is noted in the abdomen or pelvis. Reproductive: Uterus and ovaries are a trophic. Other: No significant volume of ascites.  No pneumoperitoneum. Musculoskeletal: There are no aggressive appearing lytic or blastic lesions noted in the visualized portions of the skeleton. IMPRESSION: 1. 6 mm calculus at the junction of middle and distal thirds of the left ureter is new compared to the prior study, with severe proximal chronic left-sided hydroureteronephrosis and severe atrophy of the left kidney which otherwise appears similar to the prior examination. 2. Focal areas of mural thickening involving the cecum and ascending colon, concerning for potential regions of colitis. 3. Cirrhosis. 4. Status post cholecystectomy. 5. Aortic atherosclerosis, in addition to least 2 vessel coronary artery disease. Assessment for potential risk factor modification, dietary therapy or pharmacologic therapy may be warranted, if clinically indicated. 6.  Additional incidental findings, as above. Electronically Signed   By: Vinnie Langton M.D.   On: 12/24/2018 10:48     Medications:   . sodium bicarbonate 150 mEq in dextrose 5% 1000 mL 150 mEq (12/25/18 0906)   . cholecalciferol  5,000 Units Oral Daily  . insulin aspart  0-9 Units Subcutaneous TID WC  . insulin aspart  3 Units Subcutaneous TID WC  . metoprolol tartrate  12.5 mg Oral BID  . Warfarin - Pharmacist Dosing Inpatient   Does not apply q1800   acetaminophen, ALPRAZolam, metoprolol tartrate  Assessment/ Plan:  78 y.o. female with Diabetes, atrial fibrillation, hypertension, history of, atrial fibrillation with chronic anticoagulation, chronic left hydronephrosis , was admitted on 12/23/2018 with Sepsis [facility])  Patient Active Problem List   Diagnosis Date Noted  . Other cirrhosis of liver ([place]) 12/25/2018  . Hydronephrosis, left 12/25/2018  . Sepsis ([place]) 12/24/2018  . AKI (acute kidney injury) ([place]) 12/24/2018  . Colitis, acute 12/24/2018  . Elevated troponin 12/24/2018  . Essential hypertension 12/24/2018  . Atrial fibrillation, chronic ([place]) 12/24/2018  .  Asplenia 05/19/2017  . Rectal cancer ([place]) 04/24/2016  . Panic disorder 07/03/2015  . Allergic rhinitis 03/23/2015  . Hypertension 07/04/2014  . Hypercholesteremia 07/04/2014  . Osteopenia 07/04/2014  . Vitamin D deficiency 07/04/2014  . Anxiety 07/04/2014  . CLL (chronic lymphocytic leukemia) ([place]) 07/04/2014  . Non-insulin dependent type 2 diabetes mellitus ([place]) 07/04/2014  . Diverticulitis 07/04/2014  . H/O malignant neoplasm of colon 07/04/2014  . Adiposity 07/04/2014  . Hernia of anterior abdominal wall 03/23/2013    #Acute kidney injury -Likely ATN from concurrent illness, possibly sepsis -Patient has chronic left hydronephrosis known at least since 2015, although patient reports no knowledge of this condition. Baseline creatinine of 0.89 from April 2020 -Patient has been evaluated by urology team.  no intervention. -Screening serologies ordered; complements normal  No significant improvement in renal function so far Urine output remains poor Hyperuricemia and hyperphosphatemia is noted.  Rhabdomyolysis ruled out as CK is normal -Offered dialysis to patient.  She is wanting to wait for another 1 to 2 days and observe.  In the meantime, continue IV hydration -We will continue to evaluate daily basis.    #Cirrhosis of the liver -Bilirubin 2.1, albumin 3.0 -Patient reports no prior knowledge of this issue - hepatitis B serology neg  #Acidosis -High anion gap likely due to renal failure -Agree with IV bicarbonate supplementation -Reduce rate to 75 cc/h as patient is oligoanuric.  #Diarrhea -C. difficile studies are negative -GI panel reports pending   LOS: [place] 12/11/202010:51 AM  [place], [place]  Note: This note was prepared with Dragon dictation. Any transcription errors are unintentional

## 2018-12-25 NOTE — Progress Notes (Signed)
Provider Stark Klein) notified of critical calcium level.

## 2018-12-25 NOTE — Progress Notes (Signed)
Pt has not voided all day, bladder scan 100 ml.  MD notified, no orders received.

## 2018-12-25 NOTE — Consult Note (Signed)
[place] for Warfarin Indication: atrial fibrillation  Allergies  Allergen Reactions  . Influenza Vaccines   . Iodinated Diagnostic Agents     IVP Dye  . Metformin Hcl Nausea Only and Nausea And Vomiting  . Penicillins   . Sulfa Antibiotics     Patient Measurements: Height: 5' (152.4 cm) Weight: 200 lb (90.7 kg) IBW/kg (Calculated) : 45.5   Vital Signs: Temp: 97.4 F (36.3 C) (12/11 1547) Temp Source: Oral (12/11 1547) BP: 105/83 (12/11 1547) Pulse Rate: 95 (12/11 1547)  Labs: Recent Labs    12/23/18 2008 12/23/18 2228 12/24/18 0500 12/25/18 0019 12/25/18 0557  HGB 15.2*  --  13.8  --   --   HCT 42.5  --  38.9  --   --   PLT 319  --  277  --   --   LABPROT 15.8*  --  16.2*  --  15.8*  INR 1.3*  --  1.3*  --  1.3*  CREATININE 15.47*  --  13.80* 13.72*  --   CKTOTAL  --   --  57  --   --   TROPONINIHS 79* 69*  --   --   --     Estimated Creatinine Clearance: 3.4 mL/min (A) (by C-G formula based on SCr of 13.72 mg/dL (H)).   Medications:  Warfarin 1 mg daily- last dose 12/23/18  Assessment: Pharmacy has been consulted for warfarin in patient with a PMH significant for atrial fibrillation.   Date INR  Dose  12/9 1.3 ----- 12/10 1.3        2mg  12/11   1.3  Goal of Therapy:  INR 2-3 Monitor platelets by anticoagulation protocol: Yes   Plan:  1. Will order warfarin 2 mg x1 dose for this evening. Will check INR daily and CBC every 3 days.   Paulina Fusi, PharmD, BCPS 12/25/2018 4:59 PM

## 2018-12-26 LAB — CBC
HCT: 35.8 % — ABNORMAL LOW (ref 36.0–46.0)
Hemoglobin: 12.9 g/dL (ref 12.0–15.0)
MCH: 30.6 pg (ref 26.0–34.0)
MCHC: 36 g/dL (ref 30.0–36.0)
MCV: 84.8 fL (ref 80.0–100.0)
Platelets: 286 10*3/uL (ref 150–400)
RBC: 4.22 MIL/uL (ref 3.87–5.11)
RDW: 13.8 % (ref 11.5–15.5)
WBC: 13.4 10*3/uL — ABNORMAL HIGH (ref 4.0–10.5)
nRBC: 1.5 % — ABNORMAL HIGH (ref 0.0–0.2)

## 2018-12-26 LAB — HCV COMMENT:

## 2018-12-26 LAB — CALCIUM, IONIZED: Calcium, Ionized, Serum: <3 mg/dL — ABNORMAL LOW (ref 4.5–5.6)

## 2018-12-26 LAB — PROTIME-INR
INR: 1.3 — ABNORMAL HIGH (ref 0.8–1.2)
Prothrombin Time: 16.4 seconds — ABNORMAL HIGH (ref 11.4–15.2)

## 2018-12-26 LAB — RENAL FUNCTION PANEL
Albumin: 2.2 g/dL — ABNORMAL LOW (ref 3.5–5.0)
Anion gap: 25 — ABNORMAL HIGH (ref 5–15)
BUN: 145 mg/dL — ABNORMAL HIGH (ref 8–23)
CO2: 22 mmol/L (ref 22–32)
Calcium: 5.1 mg/dL — CL (ref 8.9–10.3)
Chloride: 83 mmol/L — ABNORMAL LOW (ref 98–111)
Creatinine, Ser: 13.42 mg/dL — ABNORMAL HIGH (ref 0.44–1.00)
GFR calc Af Amer: 3 mL/min — ABNORMAL LOW (ref >60)
GFR calc non Af Amer: 2 mL/min — ABNORMAL LOW (ref >60)
Glucose, Bld: 108 mg/dL — ABNORMAL HIGH (ref 70–99)
Phosphorus: 11.1 mg/dL — ABNORMAL HIGH (ref 2.5–4.6)
Potassium: 4.1 mmol/L (ref 3.5–5.1)
Sodium: 130 mmol/L — ABNORMAL LOW (ref 135–145)

## 2018-12-26 LAB — GLUCOSE, CAPILLARY
Glucose-Capillary: 107 mg/dL — ABNORMAL HIGH (ref 70–99)
Glucose-Capillary: 63 mg/dL — ABNORMAL LOW (ref 70–99)
Glucose-Capillary: 104 mg/dL — ABNORMAL HIGH (ref 70–99)
Glucose-Capillary: 97 mg/dL (ref 70–99)
Glucose-Capillary: 73 mg/dL (ref 70–99)

## 2018-12-26 LAB — HEPATITIS C ANTIBODY (REFLEX): HCV Ab: <0.1 s/co ratio (ref 0.0–0.9)

## 2018-12-26 MED ORDER — FUROSEMIDE 10 MG/ML IJ SOLN: 40.0000 mg | Freq: Once | INTRAMUSCULAR | Status: DC

## 2018-12-26 MED ORDER — SODIUM CHLORIDE 0.9% FLUSH
3.0000 mL | Freq: Two times a day (BID) | INTRAVENOUS | Status: DC
  Administered 2018-12-26 – 2018-12-31 (×11): 3 mL via INTRAVENOUS

## 2018-12-26 MED ORDER — CALCIUM GLUCONATE-NACL 2-0.675 GM/100ML-% IV SOLN
2.0000 g | Freq: Once | INTRAVENOUS | Status: AC
  Administered 2018-12-26: 2000 mg via INTRAVENOUS
  Filled 2018-12-26: qty 100

## 2018-12-26 MED ORDER — SODIUM CHLORIDE 0.9 % IV SOLN
INTRAVENOUS | Status: DC
  Administered 2018-12-26: 10:00:00 via INTRAVENOUS

## 2018-12-26 MED ORDER — SODIUM CHLORIDE 0.9% FLUSH: 3.0000 mL | INTRAVENOUS | Status: DC | PRN

## 2018-12-26 MED ORDER — WARFARIN SODIUM 2.5 MG PO TABS
2.5000 mg | ORAL_TABLET | Freq: Once | ORAL | Status: AC
  Administered 2018-12-26: 2.5 mg via ORAL
  Filled 2018-12-26: qty 1

## 2018-12-26 NOTE — Plan of Care (Signed)

## 2018-12-26 NOTE — Progress Notes (Signed)
[place], [place] 12/26/18  Subjective:   Hospital day # 2  Neuro: No complaints cvs: Denies any chest pain or shortness of breath pulm: No cough or sputum production gi: Appetite is poor but able to tolerate clears.  No nausea or vomiting Renal: Urine output remains poor, serum creatinine remains high 12/11 0701 - 12/12 0700 In: 1089.6 [I.V.:1089.6] Out: -  Lab Results  Component Value Date   CREATININE 13.42 (H) 12/26/2018   CREATININE 13.72 (H) 12/25/2018   CREATININE 13.80 (H) 12/24/2018     Objective:  Vital signs in last 24 hours:  Temp:  [97.4 F (36.3 C)-97.8 F (36.6 C)] 97.4 F (36.3 C) (12/12 0732) Pulse Rate:  [80-100] 80 (12/12 0732) Resp:  [16-20] 20 (12/12 0732) BP: (105-127)/(70-93) 108/93 (12/12 0732) SpO2:  [94 %-98 %] 94 % (12/12 0732)  Weight change:  Filed Weights   12/23/18 1943  Weight: 90.7 kg    Intake/Output:    Intake/Output Summary (Last 24 hours) at 12/26/2018 1027 Last data filed at 12/26/2018 0800 Gross per 24 hour  Intake 1314.69 ml  Output -  Net 1314.69 ml   Physical Exam: General:  Chronically ill-appearing, frail, laying in the bed  HEENT  moist oral mucous membranes  Lungs: Room air, mild basilar crackles  Heart::  Irregular, atrial fibrillation  Abdomen:  Soft, nontender, previous surgical scar  Extremities:  Trace to 1+ edema  Neurologic:  Alert, able to follow commands  Skin:  No acute rashes  Access: Rt IJ port       Basic Metabolic Panel:  Recent Labs  Lab 12/23/18 2008 12/24/18 0500 12/25/18 0019 12/26/18 0511  NA 130* 130* 130* 130*  K 4.7 4.5 4.3 4.1  CL 86* 91* 89* 83*  CO2 13* 14* 14* 22  GLUCOSE 100* 84 125* 108*  BUN 142* 140* 141* 145*  CREATININE 15.47* 13.80* 13.72* 13.42*  CALCIUM 6.9* 6.4* 6.2* 5.1*  MG 2.3  --   --   --   PHOS  --  11.7* 11.6* 11.1*     CBC: Recent Labs  Lab 12/23/18 2008 12/24/18 0500 12/26/18 0511  WBC 19.8* 17.4* 13.4*   NEUTROABS 17.4*  --   --   HGB 15.2* 13.8 12.9  HCT 42.5 38.9 35.8*  MCV 87.4 87.4 84.8  PLT 319 277 286      Lab Results  Component Value Date   HEPBSAG NON REACTIVE 12/25/2018   HEPBSAB NON REACTIVE 12/25/2018      Microbiology:  Recent Results (from the past 240 hour(s))  C Difficile Quick Screen w PCR reflex     Status: None   Collection Time: 12/23/18 10:28 PM   Specimen: STOOL  Result Value Ref Range Status   C Diff antigen NEGATIVE NEGATIVE Final   C Diff toxin NEGATIVE NEGATIVE Final   C Diff interpretation No C. difficile detected.  Final    Comment: Performed at [facility], [address] [place], [place] [address]  Urine culture     Status: Abnormal (Preliminary result)   Collection Time: 12/23/18 10:28 PM   Specimen: In/Out Cath Urine  Result Value Ref Range Status   Specimen Description   Final    IN/OUT CATH URINE Performed at [facility], [address] [place], [place] [address]    Special Requests   Final    NONE Performed at [facility], [address] [place], [place] [address]    Culture 7,000 COLONIES/mL  ESCHERICHIA COLI (A)  Final   Report Status PENDING  Incomplete   Organism ID, Bacteria ESCHERICHIA COLI (A)  Final      Susceptibility   Escherichia coli - MIC*    AMPICILLIN >=32 RESISTANT Resistant     CEFAZOLIN <=4 SENSITIVE Sensitive     CEFTRIAXONE <=1 SENSITIVE Sensitive     CIPROFLOXACIN >=4 RESISTANT Resistant     GENTAMICIN <=1 SENSITIVE Sensitive     IMIPENEM <=0.25 SENSITIVE Sensitive     NITROFURANTOIN <=16 SENSITIVE Sensitive     TRIMETH/SULFA >=320 RESISTANT Resistant     AMPICILLIN/SULBACTAM 16 INTERMEDIATE Intermediate     PIP/TAZO <=4 SENSITIVE Sensitive     * 7,000 COLONIES/mL ESCHERICHIA COLI  Blood Culture (routine x 2)     Status: None (Preliminary result)   Collection Time: 12/23/18 10:29 PM   Specimen: BLOOD  Result Value Ref Range Status   Specimen Description BLOOD RIGHT  ANTECUBITAL  Final   Special Requests   Final    BOTTLES DRAWN AEROBIC AND ANAEROBIC Blood Culture results may not be optimal due to an excessive volume of blood received in culture bottles   Culture   Final    NO GROWTH 3 DAYS Performed at [facility], [address] [place], [place] [address]    Report Status PENDING  Incomplete  SARS CORONAVIRUS 2 (TAT 6-24 HRS) Nasopharyngeal Nasopharyngeal Swab     Status: None   Collection Time: 12/24/18 12:07 AM   Specimen: Nasopharyngeal Swab  Result Value Ref Range Status   SARS Coronavirus 2 NEGATIVE NEGATIVE Final    Comment: (NOTE) SARS-CoV-2 target nucleic acids are NOT DETECTED. The SARS-CoV-2 RNA is generally detectable in upper and lower respiratory specimens during the acute phase of infection. Negative results do not preclude SARS-CoV-2 infection, do not rule out co-infections with other pathogens, and should not be used as the sole basis for treatment or other patient management decisions. Negative results must be combined with clinical observations, patient history, and epidemiological information. The expected result is Negative. Fact Sheet for Patients: SugarRoll.be Fact Sheet for Healthcare Providers: https://www.woods-mathews.com/ This test is not yet approved or cleared by the [place] FDA and  has been authorized for detection and/or diagnosis of SARS-CoV-2 by FDA under an Emergency Use Authorization (EUA). This EUA will remain  in effect (meaning this test can be used) for the duration of the COVID-19 declaration under Section 56 4(b)(1) of the Act, 21 U.S.C. section 360bbb-3(b)(1), unless the authorization is terminated or revoked sooner. Performed at [facility], [place] [address], [place], [place] [address]     Coagulation Studies: Recent Labs    12/23/18 April 13, 2006 12/24/18 0500 12/25/18 0557 12/26/18 0511  LABPROT 15.8* 16.2* 15.8* 16.4*  INR 1.3*  1.3* 1.3* 1.3*    Urinalysis: No results for input(s): COLORURINE, LABSPEC, PHURINE, GLUCOSEU, HGBUR, BILIRUBINUR, KETONESUR, PROTEINUR, UROBILINOGEN, NITRITE, LEUKOCYTESUR in the last 72 hours.  Invalid input(s): APPERANCEUR    Imaging: No results found.   Medications:   . sodium chloride 75 mL/hr at 12/26/18 0935  . calcium gluconate 2,000 mg (12/26/18 1025)  . sodium chloride     . Chlorhexidine Gluconate Cloth  6 each Topical Daily  . cholecalciferol  5,000 Units Oral Daily  . insulin aspart  0-9 Units Subcutaneous TID WC  . insulin aspart  3 Units Subcutaneous TID WC  . metoprolol tartrate  12.5 mg Oral BID  . sodium chloride flush  3 mL Intravenous Q12H  . warfarin  2.5 mg Oral ONCE-1800  . Warfarin - Pharmacist Dosing Inpatient   Does not apply q1800   acetaminophen, ALPRAZolam, metoprolol tartrate, sodium chloride flush  Assessment/ Plan:  78 y.o. female with Diabetes, atrial fibrillation, hypertension, history of, atrial fibrillation with chronic anticoagulation, chronic left hydronephrosis , was admitted on 12/23/2018 with Sepsis [facility])  Patient Active Problem List   Diagnosis Date Noted  . Other cirrhosis of liver ([place]) 12/25/2018  . Hydronephrosis, left 12/25/2018  . Sepsis ([place]) 12/24/2018  . AKI (acute kidney injury) ([place]) 12/24/2018  . Colitis, acute 12/24/2018  . Elevated troponin 12/24/2018  . Essential hypertension 12/24/2018  . Atrial fibrillation, chronic ([place]) 12/24/2018  . Asplenia 05/19/2017  . Rectal cancer ([place]) 04/24/2016  . Panic disorder 07/03/2015  . Allergic rhinitis 03/23/2015  . Hypertension 07/04/2014  . Hypercholesteremia 07/04/2014  . Osteopenia 07/04/2014  . Vitamin D deficiency 07/04/2014  . Anxiety 07/04/2014  . CLL (chronic lymphocytic leukemia) ([place]) 07/04/2014  . Non-insulin dependent type 2 diabetes mellitus ([place]) 07/04/2014  . Diverticulitis 07/04/2014  . H/O malignant neoplasm of colon 07/04/2014  . Adiposity 07/04/2014   . Hernia of anterior abdominal wall 03/23/2013    #Acute kidney injury -Likely ATN from concurrent illness, possibly sepsis -Patient has chronic left hydronephrosis known at least since 2015, although patient reports no knowledge of this condition. Baseline creatinine of 0.89 from April 2020 -Patient has been evaluated by urology team. no intervention. -Screening serologies -ANA negative, ANCA negative, complements normal, kappa lambda light chain ratio normal  No significant improvement in renal function so far Urine output remains poor Hyperuricemia and hyperphosphatemia is noted.  Rhabdomyolysis ruled out as CK is normal -Offered dialysis to patient.  She is wanting to wait for another 1 to 2 days and observe.  In the meantime, continue IV hydration -Patient has agreed for dialysis on Monday.  We will request vascular surgery to place PermCath -Patient may end up needing a kidney biopsy for definitive diagnosis  #Cirrhosis of the liver -Bilirubin 2.1, albumin 3.0 -Patient reports no prior knowledge of this issue - hepatitis B serology neg  #Acidosis -High anion gap likely due to renal failure -Improved with IV bicarbonate.  #Diarrhea -C. difficile studies are negative -GI panel reports pending-but sample has not been collected because patient has not had any more loose stools.  #Non-insulin-dependent diabetes mellitus - Lab Results  Component Value Date   HGBA1C 6.1 (H) 12/24/2018      LOS: 2 Lekendrick Alpern 12/12/202010:27 AM  [facility] [place], [place]  Note: This note was prepared with Dragon dictation. Any transcription errors are unintentional

## 2018-12-26 NOTE — Consult Note (Signed)
[place] for Warfarin Indication: atrial fibrillation  Allergies  Allergen Reactions  . Influenza Vaccines   . Iodinated Diagnostic Agents     IVP Dye  . Metformin Hcl Nausea Only and Nausea And Vomiting  . Penicillins   . Sulfa Antibiotics     Patient Measurements: Height: 5' (152.4 cm) Weight: 200 lb (90.7 kg) IBW/kg (Calculated) : 45.5   Vital Signs: Temp: 97.4 F (36.3 C) (12/12 0732) Temp Source: Oral (12/12 0732) BP: 108/93 (12/12 0732) Pulse Rate: 80 (12/12 0732)  Labs: Recent Labs    12/23/18 2008 12/23/18 2228 12/24/18 0500 12/25/18 0019 12/25/18 0557 12/26/18 0511  HGB 15.2*  --  13.8  --   --  12.9  HCT 42.5  --  38.9  --   --  35.8*  PLT 319  --  277  --   --  286  LABPROT 15.8*  --  16.2*  --  15.8* 16.4*  INR 1.3*  --  1.3*  --  1.3* 1.3*  CREATININE 15.47*  --  13.80* 13.72*  --  13.42*  CKTOTAL  --   --  57  --   --   --   TROPONINIHS 79* 69*  --   --   --   --     Estimated Creatinine Clearance: 3.5 mL/min (A) (by C-G formula based on SCr of 13.42 mg/dL (H)).   Medications:  Warfarin 1 mg daily- last dose 12/23/18  Assessment: Pharmacy has been consulted for warfarin in patient with a PMH significant for atrial fibrillation.   Date INR  Dose  12/9 1.3 ----- 12/10 1.3       2 mg 12/11   1.3 2 mg 12/12 1.3  Goal of Therapy:  INR 2-3 Monitor platelets by anticoagulation protocol: Yes   Plan:  INR subtherapeutic. Will give warfarin 2.5 mg tonight. INR with am labs. CBC q72h per protocol.  Dorena Bodo, PharmD 12/26/2018 9:54 AM

## 2018-12-26 NOTE — Progress Notes (Addendum)
PROGRESS NOTE    Danielle Rowland  DVV:[id number] DOB: 10-Jan-1941 DOA: 12/23/2018 PCP: Birdie Sons, MD    Brief Narrative:  78 y/o female with history of diabetes, A fib on coumadin, HTN and CLL, presents with 4 day history of diarrhea and vomiting. She was noted to be in acute renal failure with creatinine of 13, metabolic acidosis and elevated wbc count. CT abdomen shows evidence of colitis. C diff was found to be negative. Nephrology following for renal failure.   Assessment & Plan:   Principal Problem:   Sepsis ([place]) Active Problems:   CLL (chronic lymphocytic leukemia) (HCC)   Non-insulin dependent type 2 diabetes mellitus (HCC)   AKI (acute kidney injury) ([place])   Colitis, acute   Elevated troponin   Essential hypertension   Atrial fibrillation, chronic (HCC)   Other cirrhosis of liver (HCC)   Hydronephrosis, left   1. Sepsis secondary to acute colitis.  Continue IV hydration.  Stool for C. difficile negative.  GI pathogen panel in process.  Blood culture has shown no growth. Overall diarrhea improving.  She is afebrile at this time.  Lactic acidosis is improving with IV hydration. 2. Acute kidney injury.  Likely related to dehydration from diarrhea/colitis.  Nephrology consulted.  Renal ultrasound showed left-sided hydronephrosis, but this was a chronic finding.  Seen by urology and stent placement was not felt to be indicated. CK levels noted to be normal.  She is being treated with IV fluids but did not have any significant urine output.  Discussed with nephrology, as well as patient.  It is felt that dialysis would likely be helpful for this patient.  Plan is for likely to undergo dialysis on 12/14.  She is now becoming more short of breath and has not had any urine output.  Will discontinue further IV fluids and give a dose of Lasix. 3. Metabolic acidosis.  Suspect is related to GI losses dehydration.  Resolved with bicarbonate infusion 4. Elevated troponin.  Mild.   Suspect this is demand ischemia in the setting of acute illness. 5. Type 2 diabetes, non-insulin-dependent, hold oral agents, blood sugars are stable on sliding scale insulin.  A1c of 6.1. 6. Chronic atrial fibrillation.  Presented with rapid ventricular response in the setting of dehydration and acute illness.  She is anticoagulated with Coumadin.  Continue IV hydration which should help her heart rate.    Heart rate stable on metoprolol. 7. Chronic lymphocytic leukemia.  Follow-up with oncology as an outpatient. 8. Cirrhosis.  Unclear etiology.  Viral hepatitis markers are negative. 9. Hypocalcemia.  Calcium noted to be 5.1 this morning.  When corrected for albumin, it is still low at 6.9.  Ionized calcium less than 3.  Will provide intravenous calcium supplementation.   DVT prophylaxis: coumadin Code Status: DNR Family Communication: discussed with sister over the phone on 12/12 Disposition Plan: pending hospital course   Consultants:   Nephrology  Urology  Procedures:     Antimicrobials:       Subjective: Feels nauseous.  Feels weak and tired.  Feels dizzy.  Staff reports that she become more short of breath when sitting up  Objective: Vitals:   12/25/18 2332 12/26/18 0400 12/26/18 0732 12/26/18 1554  BP: 117/70 111/73 (!) 108/93 121/76  Pulse: 96 97 80 86  Resp: 18 18 20 16   Temp: (!) 97.5 F (36.4 C) 97.8 F (36.6 C) (!) 97.4 F (36.3 C) (!) 97.4 F (36.3 C)  TempSrc: Oral  Oral Oral  SpO2: 98% 96% 94% 92%  Weight:      Height:        Intake/Output Summary (Last 24 hours) at 12/26/2018 1845 Last data filed at 12/26/2018 1700 Gross per 24 hour  Intake 1924.18 ml  Output --  Net 1924.18 ml   Filed Weights   12/23/18 1943  Weight: 90.7 kg    Examination:  General exam: Alert, awake, oriented x 3 Respiratory system: Crackles at bases. Respiratory effort normal. Cardiovascular system:RRR. No murmurs, rubs, gallops. Gastrointestinal system: Abdomen is  nondistended, soft and nontender. No organomegaly or masses felt. Normal bowel sounds heard. Central nervous system: Alert and oriented. No focal neurological deficits. Extremities: 1+ edema bilaterally Skin: No rashes, lesions or ulcers Psychiatry: Judgement and insight appear normal. Mood & affect appropriate.       Data Reviewed: I have personally reviewed following labs and imaging studies  CBC: Recent Labs  Lab 12/23/18 2008 12/24/18 0500 12/26/18 0511  WBC 19.8* 17.4* 13.4*  NEUTROABS 17.4*  --   --   HGB 15.2* 13.8 12.9  HCT 42.5 38.9 35.8*  MCV 87.4 87.4 84.8  PLT 319 277 403   Basic Metabolic Panel: Recent Labs  Lab 12/23/18 2008 12/24/18 0500 12/25/18 0019 12/26/18 0511  NA 130* 130* 130* 130*  K 4.7 4.5 4.3 4.1  CL 86* 91* 89* 83*  CO2 13* 14* 14* 22  GLUCOSE 100* 84 125* 108*  BUN 142* 140* 141* 145*  CREATININE 15.47* 13.80* 13.72* 13.42*  CALCIUM 6.9* 6.4* 6.2* 5.1*  MG 2.3  --   --   --   PHOS  --  11.7* 11.6* 11.1*   GFR: Estimated Creatinine Clearance: 3.5 mL/min (A) (by C-G formula based on SCr of 13.42 mg/dL (H)). Liver Function Tests: Recent Labs  Lab 12/23/18 2008 12/24/18 0500 12/25/18 0019 12/26/18 0511  AST 21 22  --   --   ALT 31 27  --   --   ALKPHOS 164* 157*  --   --   BILITOT 2.1* 1.8*  --   --   PROT 7.5 6.4*  --   --   ALBUMIN 3.0* 2.6* 2.5* 2.2*   No results for input(s): LIPASE, AMYLASE in the last 168 hours. No results for input(s): AMMONIA in the last 168 hours. Coagulation Profile: Recent Labs  Lab 12/23/18 2008 12/24/18 0500 12/25/18 0557 12/26/18 0511  INR 1.3* 1.3* 1.3* 1.3*   Cardiac Enzymes: Recent Labs  Lab 12/24/18 0500  CKTOTAL 57   BNP (last 3 results) No results for input(s): PROBNP in the last 8760 hours. HbA1C: Recent Labs    12/24/18 0500  HGBA1C 6.1*   CBG: Recent Labs  Lab 12/25/18 2119 12/26/18 0837 12/26/18 1215 12/26/18 1304 12/26/18 1655  GLUCAP 125* 107* 63* 104* 97    Lipid Profile: No results for input(s): CHOL, HDL, LDLCALC, TRIG, CHOLHDL, LDLDIRECT in the last 72 hours. Thyroid Function Tests: Recent Labs    12/23/18 2008  TSH 2.830   Anemia Panel: No results for input(s): VITAMINB12, FOLATE, FERRITIN, TIBC, IRON, RETICCTPCT in the last 72 hours. Sepsis Labs: Recent Labs  Lab 12/23/18 2007 12/24/18 0007 12/24/18 0500  PROCALCITON  --   --  8.07  LATICACIDVEN 2.8* 1.7  --     Recent Results (from the past 240 hour(s))  C Difficile Quick Screen w PCR reflex     Status: None   Collection Time: 12/23/18 10:28 PM   Specimen: STOOL  Result Value Ref Range  Status   C Diff antigen NEGATIVE NEGATIVE Final   C Diff toxin NEGATIVE NEGATIVE Final   C Diff interpretation No C. difficile detected.  Final    Comment: Performed at [facility], [address] [place], [place] [address]  Urine culture     Status: Abnormal (Preliminary result)   Collection Time: 12/23/18 10:28 PM   Specimen: In/Out Cath Urine  Result Value Ref Range Status   Specimen Description   Final    IN/OUT CATH URINE Performed at [facility], [address] [place], [place] [address]    Special Requests   Final    NONE Performed at [facility], [address] [place], [place] [address]    Culture (A)  Final    7,000 COLONIES/mL ESCHERICHIA COLI 1,000 COLONIES/mL ENTEROCOCCUS FAECALIS    Report Status PENDING  Incomplete   Organism ID, Bacteria ESCHERICHIA COLI (A)  Final      Susceptibility   Escherichia coli - MIC*    AMPICILLIN >=32 RESISTANT Resistant     CEFAZOLIN <=4 SENSITIVE Sensitive     CEFTRIAXONE <=1 SENSITIVE Sensitive     CIPROFLOXACIN >=4 RESISTANT Resistant     GENTAMICIN <=1 SENSITIVE Sensitive     IMIPENEM <=0.25 SENSITIVE Sensitive     NITROFURANTOIN <=16 SENSITIVE Sensitive     TRIMETH/SULFA >=320 RESISTANT Resistant     AMPICILLIN/SULBACTAM 16 INTERMEDIATE Intermediate     PIP/TAZO <=4 SENSITIVE Sensitive      * 7,000 COLONIES/mL ESCHERICHIA COLI  Blood Culture (routine x 2)     Status: None (Preliminary result)   Collection Time: 12/23/18 10:29 PM   Specimen: BLOOD  Result Value Ref Range Status   Specimen Description BLOOD RIGHT ANTECUBITAL  Final   Special Requests   Final    BOTTLES DRAWN AEROBIC AND ANAEROBIC Blood Culture results may not be optimal due to an excessive volume of blood received in culture bottles   Culture   Final    NO GROWTH 3 DAYS Performed at [facility], [address] [place], [place] [address]    Report Status PENDING  Incomplete  SARS CORONAVIRUS 2 (TAT 6-24 HRS) Nasopharyngeal Nasopharyngeal Swab     Status: None   Collection Time: 12/24/18 12:07 AM   Specimen: Nasopharyngeal Swab  Result Value Ref Range Status   SARS Coronavirus 2 NEGATIVE NEGATIVE Final    Comment: (NOTE) SARS-CoV-2 target nucleic acids are NOT DETECTED. The SARS-CoV-2 RNA is generally detectable in upper and lower respiratory specimens during the acute phase of infection. Negative results do not preclude SARS-CoV-2 infection, do not rule out co-infections with other pathogens, and should not be used as the sole basis for treatment or other patient management decisions. Negative results must be combined with clinical observations, patient history, and epidemiological information. The expected result is Negative. Fact Sheet for Patients: SugarRoll.be Fact Sheet for Healthcare Providers: https://www.woods-mathews.com/ This test is not yet approved or cleared by the [place] FDA and  has been authorized for detection and/or diagnosis of SARS-CoV-2 by FDA under an Emergency Use Authorization (EUA). This EUA will remain  in effect (meaning this test can be used) for the duration of the COVID-19 declaration under Section 56 4(b)(1) of the Act, 21 U.S.C. section 360bbb-3(b)(1), unless the authorization is terminated or revoked  sooner. Performed at [facility], [place] [address], [place], [place] [address]          Radiology Studies: No results found.  Scheduled Meds: . Chlorhexidine Gluconate Cloth  6 each Topical Daily  . cholecalciferol  5,000 Units Oral Daily  . insulin aspart  0-9 Units Subcutaneous TID WC  . insulin aspart  3 Units Subcutaneous TID WC  . metoprolol tartrate  12.5 mg Oral BID  . sodium chloride flush  3 mL Intravenous Q12H  . Warfarin - Pharmacist Dosing Inpatient   Does not apply q1800   Continuous Infusions: . sodium chloride 40 mL/hr at 12/26/18 1300  . sodium chloride       LOS: 2 days    Time spent: 52mins    Kathie Dike, MD [facility]   If 7PM-7AM, please contact night-coverage [email or web address]  12/26/2018, 6:45 PM

## 2018-12-26 NOTE — Progress Notes (Signed)
Spoke with family member with copies of Advanced Directives requested; states she will try to find them and bring copy. Update given and discussed with stated understanding.

## 2018-12-26 NOTE — Progress Notes (Signed)
Patient's calcium 5.1, notified on call provider Rufina Falco NP, stated she would check ionized calcium. Will relay message to day shift RN.

## 2018-12-26 NOTE — Progress Notes (Addendum)
Pt wanted to get OOB.  3 staff members brought pt to sitting/dangled on side of bed with pt becoming weak/SOB which resolved with lying back down and rest. Brown/amber liquid on incontinent pad with smear of brown stool. Pt requested xanax and was given. Comfort measures performed with pericare done. Emotional support given.

## 2018-12-26 NOTE — Progress Notes (Addendum)
Pt is drowsy with slow mental processes/dull affect. Denies co's pain. Respirations easy/slow. Increased generalized edema with IVF rate decreased to 40 ml/hr. Needs assistance and encouragement to turn/drink/feed herself. POCT glucose at lunch 63 with pt given 3 servings juice and 1 1/2 cracker with peanut butter with much encouragement and constant supervision. NO URINARY OUTPUT with Dr. Candiss Norse updated. Bladder scan- 140 ml this morning. Poor appetite with sips/bites at breakfast/lunch.

## 2018-12-27 LAB — PROTIME-INR
INR: 1.4 — ABNORMAL HIGH (ref 0.8–1.2)
Prothrombin Time: 17.1 seconds — ABNORMAL HIGH (ref 11.4–15.2)

## 2018-12-27 LAB — GLUCOSE, CAPILLARY
Glucose-Capillary: 64 mg/dL — ABNORMAL LOW (ref 70–99)
Glucose-Capillary: 131 mg/dL — ABNORMAL HIGH (ref 70–99)
Glucose-Capillary: 151 mg/dL — ABNORMAL HIGH (ref 70–99)
Glucose-Capillary: 130 mg/dL — ABNORMAL HIGH (ref 70–99)
Glucose-Capillary: 100 mg/dL — ABNORMAL HIGH (ref 70–99)

## 2018-12-27 LAB — RENAL FUNCTION PANEL
Albumin: 2.2 g/dL — ABNORMAL LOW (ref 3.5–5.0)
Anion gap: 26 — ABNORMAL HIGH (ref 5–15)
BUN: 145 mg/dL — ABNORMAL HIGH (ref 8–23)
CO2: 20 mmol/L — ABNORMAL LOW (ref 22–32)
Calcium: 5.1 mg/dL — CL (ref 8.9–10.3)
Chloride: 82 mmol/L — ABNORMAL LOW (ref 98–111)
Creatinine, Ser: 13.67 mg/dL — ABNORMAL HIGH (ref 0.44–1.00)
GFR calc Af Amer: 3 mL/min — ABNORMAL LOW (ref >60)
GFR calc non Af Amer: 2 mL/min — ABNORMAL LOW (ref >60)
Glucose, Bld: 76 mg/dL (ref 70–99)
Phosphorus: 11.6 mg/dL — ABNORMAL HIGH (ref 2.5–4.6)
Potassium: 4.6 mmol/L (ref 3.5–5.1)
Sodium: 128 mmol/L — ABNORMAL LOW (ref 135–145)

## 2018-12-27 LAB — URINALYSIS, COMPLETE (UACMP) WITH MICROSCOPIC
Specific Gravity, Urine: 1.031 — ABNORMAL HIGH (ref 1.005–1.030)
WBC, UA: >50 WBC/hpf — ABNORMAL HIGH (ref 0–5)

## 2018-12-27 LAB — URINE CULTURE: Culture: 7000 — AB

## 2018-12-27 LAB — CBC
HCT: 35.8 % — ABNORMAL LOW (ref 36.0–46.0)
Hemoglobin: 13.5 g/dL (ref 12.0–15.0)
MCH: 30.9 pg (ref 26.0–34.0)
MCHC: 37.7 g/dL — ABNORMAL HIGH (ref 30.0–36.0)
MCV: 81.9 fL (ref 80.0–100.0)
Platelets: 268 10*3/uL (ref 150–400)
RBC: 4.37 MIL/uL (ref 3.87–5.11)
RDW: 14 % (ref 11.5–15.5)
WBC: 15.4 10*3/uL — ABNORMAL HIGH (ref 4.0–10.5)
nRBC: 1.4 % — ABNORMAL HIGH (ref 0.0–0.2)

## 2018-12-27 LAB — PROTEIN / CREATININE RATIO, URINE
Creatinine, Urine: 23 mg/dL
Protein Creatinine Ratio: 129.61 mg/mg{Cre} — ABNORMAL HIGH (ref 0.00–0.15)
Total Protein, Urine: 2981 mg/dL

## 2018-12-27 LAB — CALCIUM, IONIZED: Calcium, Ionized, Serum: <3 mg/dL — ABNORMAL LOW (ref 4.5–5.6)

## 2018-12-27 MED ORDER — CALCIUM GLUCONATE-NACL 2-0.675 GM/100ML-% IV SOLN: 2.0000 g | Freq: Once | INTRAVENOUS | Status: DC

## 2018-12-27 MED ORDER — DEXTROSE-NACL 5-0.9 % IV SOLN
INTRAVENOUS | Status: DC
  Administered 2018-12-27 – 2018-12-28 (×2): via INTRAVENOUS

## 2018-12-27 MED ORDER — WARFARIN SODIUM 2.5 MG PO TABS
2.5000 mg | ORAL_TABLET | Freq: Once | ORAL | Status: AC
  Administered 2018-12-27: 2.5 mg via ORAL
  Filled 2018-12-27: qty 1

## 2018-12-27 MED ORDER — METOPROLOL TARTRATE 25 MG PO TABS
25.0000 mg | ORAL_TABLET | Freq: Two times a day (BID) | ORAL | Status: DC
  Administered 2018-12-27 – 2019-01-01 (×8): 25 mg via ORAL
  Filled 2018-12-27 (×8): qty 1

## 2018-12-27 MED ORDER — LOPERAMIDE HCL 2 MG PO CAPS
2.0000 mg | ORAL_CAPSULE | Freq: Four times a day (QID) | ORAL | Status: DC | PRN
  Administered 2018-12-27: 2 mg via ORAL
  Filled 2018-12-27 (×2): qty 1

## 2018-12-27 MED ORDER — FLUCONAZOLE 50 MG PO TABS
150.0000 mg | ORAL_TABLET | Freq: Once | ORAL | Status: AC
  Administered 2018-12-27: 150 mg via ORAL
  Filled 2018-12-27: qty 1

## 2018-12-27 MED ORDER — LOPERAMIDE HCL 2 MG PO CAPS
2.0000 mg | ORAL_CAPSULE | Freq: Once | ORAL | Status: AC
  Administered 2018-12-27: 2 mg via ORAL
  Filled 2018-12-27: qty 1

## 2018-12-27 MED ORDER — CALCIUM CARBONATE 1250 (500 CA) MG PO TABS
1.0000 | ORAL_TABLET | Freq: Three times a day (TID) | ORAL | Status: DC
  Administered 2018-12-27 (×2): 500 mg via ORAL
  Filled 2018-12-27 (×6): qty 1

## 2018-12-27 NOTE — Consult Note (Signed)
[place] for Warfarin Indication: atrial fibrillation  Allergies  Allergen Reactions  . Influenza Vaccines   . Iodinated Diagnostic Agents     IVP Dye  . Metformin Hcl Nausea Only and Nausea And Vomiting  . Penicillins   . Sulfa Antibiotics     Patient Measurements: Height: 5' (152.4 cm) Weight: 200 lb (90.7 kg) IBW/kg (Calculated) : 45.5   Vital Signs: Temp: 96.7 F (35.9 C) (12/13 0856) Temp Source: Oral (12/13 0856) BP: 125/79 (12/13 0856) Pulse Rate: 84 (12/13 0856)  Labs: Recent Labs    12/25/18 0019 12/25/18 0557 12/26/18 0511 12/27/18 0449  HGB  --   --  12.9 13.5  HCT  --   --  35.8* 35.8*  PLT  --   --  286 268  LABPROT  --  15.8* 16.4* 17.1*  INR  --  1.3* 1.3* 1.4*  CREATININE 13.72*  --  13.42* 13.67*    Estimated Creatinine Clearance: 3.5 mL/min (A) (by C-G formula based on SCr of 13.67 mg/dL (H)).   Medications:  Warfarin 1 mg daily- last dose 12/23/18  Assessment: Pharmacy has been consulted for warfarin in patient with a PMH significant for atrial fibrillation.   Date INR  Dose  12/9 1.3 ----- 12/10 1.3       2 mg 12/11   1.3 2 mg 12/12 1.3 2.5 mg 12/13 1.4   Goal of Therapy:  INR 2-3 Monitor platelets by anticoagulation protocol: Yes   Plan:  INR subtherapeutic. Will give warfarin 2.5 mg again tonight. INR with am labs. CBC q72h per protocol.  Dorena Bodo, PharmD 12/27/2018 9:53 AM

## 2018-12-27 NOTE — Progress Notes (Signed)
PROGRESS NOTE    Danielle Rowland  BPZ:[id number] DOB: January 11, 1941 DOA: 12/23/2018 PCP: Birdie Sons, MD    Brief Narrative:  78 y/o female with history of diabetes, A fib on coumadin, HTN and CLL, presents with 4 day history of diarrhea and vomiting. She was noted to be in acute renal failure with creatinine of 13, metabolic acidosis and elevated wbc count. CT abdomen shows evidence of colitis. C diff was found to be negative. Nephrology following for renal failure.   Assessment & Plan:   Principal Problem:   Sepsis ([place]) Active Problems:   CLL (chronic lymphocytic leukemia) (HCC)   Non-insulin dependent type 2 diabetes mellitus (HCC)   AKI (acute kidney injury) ([place])   Colitis, acute   Elevated troponin   Essential hypertension   Atrial fibrillation, chronic (HCC)   Other cirrhosis of liver (HCC)   Hydronephrosis, left   1. Sepsis secondary to acute colitis.  Continue IV hydration.  Stool for C. difficile negative.  GI pathogen panel in process.  Blood culture has shown no growth. Overall diarrhea improving.  She is afebrile at this time.  Lactic acidosis is improving with IV hydration. 2. Acute kidney injury.  Likely related to dehydration from diarrhea/colitis.  Nephrology consulted.  Renal ultrasound showed left-sided hydronephrosis, but this was a chronic finding.  Seen by urology and stent placement was not felt to be indicated. CK levels noted to be normal.  She is being treated with IV fluids but did not have any significant urine output.  Discussed with nephrology, as well as patient.  It is felt that dialysis would likely be helpful for this patient.  Plan is for likely to undergo dialysis on 12/14.   3. Metabolic acidosis.  Suspect is related to GI losses dehydration.  Resolved with bicarbonate infusion 4. Elevated troponin.  Mild.  Suspect this is demand ischemia in the setting of acute illness. 5. Type 2 diabetes, non-insulin-dependent, hold oral agents.  She is  having episodes of hypoglycemia.  Will start on dextrose in maintenance fluid.  A1c of 6.1. 6. Chronic atrial fibrillation.  Presented with rapid ventricular response in the setting of dehydration and acute illness.  She is anticoagulated with Coumadin.  Continue IV hydration which should help her heart rate.    Heart rate stable on metoprolol. 7. Chronic lymphocytic leukemia.  Follow-up with oncology as an outpatient. 8. Cirrhosis.  Unclear etiology.  Viral hepatitis markers are negative. 9. Hypocalcemia.  Calcium noted to be 5.1 this morning.  When corrected for albumin, it is still low at 6.9.  Ionized calcium less than 3.  Patient does not appear to be having any symptoms.  Will supplement with p.o. calcium for now since her phosphorus is significantly elevated   DVT prophylaxis: coumadin Code Status: DNR Family Communication: discussed with sister over the phone on 12/12 Disposition Plan: pending hospital course   Consultants:   Nephrology  Urology  Procedures:     Antimicrobials:       Subjective: Appears to be having frequent diarrhea.  Feels tired and weak.  No vomiting.  P.o. intake has been poor.  Objective: Vitals:   12/26/18 1554 12/26/18 2129 12/27/18 0124 12/27/18 0856  BP: 121/76 107/78 109/63 125/79  Pulse: 86 72 94 84  Resp: 16  18 20   Temp: (!) 97.4 F (36.3 C) (!) 97.4 F (36.3 C) (!) 97.3 F (36.3 C) (!) 96.7 F (35.9 C)  TempSrc: Oral Oral Oral Oral  SpO2: 92% 95% 94%  96%  Weight:      Height:        Intake/Output Summary (Last 24 hours) at 12/27/2018 1035 Last data filed at 12/27/2018 0936 Gross per 24 hour  Intake 1430.43 ml  Output 30 ml  Net 1400.43 ml   Filed Weights   12/23/18 1943  Weight: 90.7 kg    Examination:  General exam: Alert, awake, oriented x 3 Respiratory system: Crackles at bases. Respiratory effort normal. Cardiovascular system:RRR. No murmurs, rubs, gallops. Gastrointestinal system: Abdomen is nondistended,  soft and nontender. No organomegaly or masses felt. Normal bowel sounds heard. Central nervous system: Alert and oriented. No focal neurological deficits. Extremities: 1+ edema bilaterally Skin: No rashes, lesions or ulcers Psychiatry: Judgement and insight appear normal. Mood & affect appropriate.    Data Reviewed: I have personally reviewed following labs and imaging studies  CBC: Recent Labs  Lab 12/23/18 2008 12/24/18 0500 12/26/18 0511 12/27/18 0449  WBC 19.8* 17.4* 13.4* 15.4*  NEUTROABS 17.4*  --   --   --   HGB 15.2* 13.8 12.9 13.5  HCT 42.5 38.9 35.8* 35.8*  MCV 87.4 87.4 84.8 81.9  PLT 319 277 286 119   Basic Metabolic Panel: Recent Labs  Lab 12/23/18 2008 12/24/18 0500 12/25/18 0019 12/26/18 0511 12/27/18 0449  NA 130* 130* 130* 130* 128*  K 4.7 4.5 4.3 4.1 4.6  CL 86* 91* 89* 83* 82*  CO2 13* 14* 14* 22 20*  GLUCOSE 100* 84 125* 108* 76  BUN 142* 140* 141* 145* 145*  CREATININE 15.47* 13.80* 13.72* 13.42* 13.67*  CALCIUM 6.9* 6.4* 6.2* 5.1* 5.1*  MG 2.3  --   --   --   --   PHOS  --  11.7* 11.6* 11.1* 11.6*   GFR: Estimated Creatinine Clearance: 3.5 mL/min (A) (by C-G formula based on SCr of 13.67 mg/dL (H)). Liver Function Tests: Recent Labs  Lab 12/23/18 2008 12/24/18 0500 12/25/18 0019 12/26/18 0511 12/27/18 0449  AST 21 22  --   --   --   ALT 31 27  --   --   --   ALKPHOS 164* 157*  --   --   --   BILITOT 2.1* 1.8*  --   --   --   PROT 7.5 6.4*  --   --   --   ALBUMIN 3.0* 2.6* 2.5* 2.2* 2.2*   No results for input(s): LIPASE, AMYLASE in the last 168 hours. No results for input(s): AMMONIA in the last 168 hours. Coagulation Profile: Recent Labs  Lab 12/23/18 2008 12/24/18 0500 12/25/18 0557 12/26/18 0511 12/27/18 0449  INR 1.3* 1.3* 1.3* 1.3* 1.4*   Cardiac Enzymes: Recent Labs  Lab 12/24/18 0500  CKTOTAL 57   BNP (last 3 results) No results for input(s): PROBNP in the last 8760 hours. HbA1C: No results for input(s):  HGBA1C in the last 72 hours. CBG: Recent Labs  Lab 12/26/18 1304 12/26/18 1655 12/26/18 2106 12/27/18 0920 12/27/18 1023  GLUCAP 104* 97 73 64* 131*   Lipid Profile: No results for input(s): CHOL, HDL, LDLCALC, TRIG, CHOLHDL, LDLDIRECT in the last 72 hours. Thyroid Function Tests: No results for input(s): TSH, T4TOTAL, FREET4, T3FREE, THYROIDAB in the last 72 hours. Anemia Panel: No results for input(s): VITAMINB12, FOLATE, FERRITIN, TIBC, IRON, RETICCTPCT in the last 72 hours. Sepsis Labs: Recent Labs  Lab 12/23/18 2007 12/24/18 0007 12/24/18 0500  PROCALCITON  --   --  8.07  LATICACIDVEN 2.8* 1.7  --  Recent Results (from the past 240 hour(s))  C Difficile Quick Screen w PCR reflex     Status: None   Collection Time: 12/23/18 10:28 PM   Specimen: STOOL  Result Value Ref Range Status   C Diff antigen NEGATIVE NEGATIVE Final   C Diff toxin NEGATIVE NEGATIVE Final   C Diff interpretation No C. difficile detected.  Final    Comment: Performed at [facility], [place] [place], [place] [address]  Urine culture     Status: Abnormal   Collection Time: 12/23/18 10:28 PM   Specimen: In/Out Cath Urine  Result Value Ref Range Status   Specimen Description   Final    IN/OUT CATH URINE Performed at [facility], [address] [place], [place] [address]    Special Requests   Final    NONE Performed at [facility], [address] [place], [place] [address]    Culture (A)  Final    7,000 COLONIES/mL ESCHERICHIA COLI 1,000 COLONIES/mL ENTEROCOCCUS FAECALIS    Report Status 12/27/2018 FINAL  Final   Organism ID, Bacteria ESCHERICHIA COLI (A)  Final   Organism ID, Bacteria ENTEROCOCCUS FAECALIS (A)  Final      Susceptibility   Escherichia coli - MIC*    AMPICILLIN >=32 RESISTANT Resistant     CEFAZOLIN <=4 SENSITIVE Sensitive     CEFTRIAXONE <=1 SENSITIVE Sensitive     CIPROFLOXACIN >=4 RESISTANT Resistant     GENTAMICIN <=1  SENSITIVE Sensitive     IMIPENEM <=0.25 SENSITIVE Sensitive     NITROFURANTOIN <=16 SENSITIVE Sensitive     TRIMETH/SULFA >=320 RESISTANT Resistant     AMPICILLIN/SULBACTAM 16 INTERMEDIATE Intermediate     PIP/TAZO <=4 SENSITIVE Sensitive     * 7,000 COLONIES/mL ESCHERICHIA COLI   Enterococcus faecalis - MIC*    AMPICILLIN <=2 SENSITIVE Sensitive     NITROFURANTOIN <=16 SENSITIVE Sensitive     VANCOMYCIN 1 SENSITIVE Sensitive     * 1,000 COLONIES/mL ENTEROCOCCUS FAECALIS  Blood Culture (routine x 2)     Status: None (Preliminary result)   Collection Time: 12/23/18 10:29 PM   Specimen: BLOOD  Result Value Ref Range Status   Specimen Description BLOOD RIGHT ANTECUBITAL  Final   Special Requests   Final    BOTTLES DRAWN AEROBIC AND ANAEROBIC Blood Culture results may not be optimal due to an excessive volume of blood received in culture bottles   Culture   Final    NO GROWTH 4 DAYS Performed at [facility], [address] [place], [place] [address]    Report Status PENDING  Incomplete  SARS CORONAVIRUS 2 (TAT 6-24 HRS) Nasopharyngeal Nasopharyngeal Swab     Status: None   Collection Time: 12/24/18 12:07 AM   Specimen: Nasopharyngeal Swab  Result Value Ref Range Status   SARS Coronavirus 2 NEGATIVE NEGATIVE Final    Comment: (NOTE) SARS-CoV-2 target nucleic acids are NOT DETECTED. The SARS-CoV-2 RNA is generally detectable in upper and lower respiratory specimens during the acute phase of infection. Negative results do not preclude SARS-CoV-2 infection, do not rule out co-infections with other pathogens, and should not be used as the sole basis for treatment or other patient management decisions. Negative results must be combined with clinical observations, patient history, and epidemiological information. The expected result is Negative. Fact Sheet for Patients: SugarRoll.be Fact Sheet for Healthcare  Providers: https://www.woods-mathews.com/ This test is not yet approved or cleared by the [place] FDA and  has been  authorized for detection and/or diagnosis of SARS-CoV-2 by FDA under an Emergency Use Authorization (EUA). This EUA will remain  in effect (meaning this test can be used) for the duration of the COVID-19 declaration under Section 56 4(b)(1) of the Act, 21 U.S.C. section 360bbb-3(b)(1), unless the authorization is terminated or revoked sooner. Performed at [facility], [place] [address] [place], [place] [address]          Radiology Studies: No results found.      Scheduled Meds: . calcium carbonate  1 tablet Oral TID WC  . Chlorhexidine Gluconate Cloth  6 each Topical Daily  . cholecalciferol  5,000 Units Oral Daily  . loperamide  2 mg Oral Once  . metoprolol tartrate  25 mg Oral BID  . sodium chloride flush  3 mL Intravenous Q12H  . warfarin  2.5 mg Oral ONCE-1800  . Warfarin - Pharmacist Dosing Inpatient   Does not apply q1800   Continuous Infusions: . dextrose 5 % and 0.9% NaCl 50 mL/hr at 12/27/18 0940  . sodium chloride       LOS: 3 days    Time spent: 4mins    Kathie Dike, MD [facility]   If 7PM-7AM, please contact night-coverage [email or web address]  12/27/2018, 10:35 AM

## 2018-12-27 NOTE — Progress Notes (Signed)
Vascular Surgery  Patient with acute kidney injury. Now requiring hemodialysis.  Will plan for placement of Permacath tomorrow per Dr. Lucky Cowboy.

## 2018-12-27 NOTE — Progress Notes (Signed)
[name], [place] 12/27/18  Subjective:   Hospital day # 3  Neuro: No complaints cvs: Denies any chest pain or shortness of breath pulm: No cough or sputum production gi: Appetite is poor but able to tolerate clears.  No nausea or vomiting Renal: Urine output remains poor, serum creatinine remains high 12/12 0701 - 12/13 0700 In: 834.5 [P.O.:384; I.V.:350.5; IV Piggyback:100] Out: 30 [Urine:30] Lab Results  Component Value Date   CREATININE 13.67 (H) 12/27/2018   CREATININE 13.42 (H) 12/26/2018   CREATININE 13.72 (H) 12/25/2018     Objective:  Vital signs in last 24 hours:  Temp:  [96.7 F (35.9 C)-97.4 F (36.3 C)] 96.7 F (35.9 C) (12/13 0856) Pulse Rate:  [72-94] 84 (12/13 0856) Resp:  [16-20] 20 (12/13 0856) BP: (107-125)/(63-79) 125/79 (12/13 0856) SpO2:  [92 %-96 %] 96 % (12/13 0856)  Weight change:  Filed Weights   12/23/18 1943  Weight: 90.7 kg    Intake/Output:    Intake/Output Summary (Last 24 hours) at 12/27/2018 1240 Last data filed at 12/27/2018 1130 Gross per 24 hour  Intake 1430.43 ml  Output 31 ml  Net 1399.43 ml   Physical Exam: General:  Chronically ill-appearing, frail, laying in the bed  HEENT  moist oral mucous membranes  Lungs: Room air, mild basilar crackles  Heart::  Irregular, atrial fibrillation  Abdomen:  Soft, nontender, previous surgical scar  Extremities:  Trace to 1+ edema  Neurologic:  Alert, able to follow commands  Skin:  No acute rashes  Access: Rt IJ port       Basic Metabolic Panel:  Recent Labs  Lab 12/23/18 2008 12/24/18 0500 12/25/18 0019 12/26/18 0511 12/27/18 0449  NA 130* 130* 130* 130* 128*  K 4.7 4.5 4.3 4.1 4.6  CL 86* 91* 89* 83* 82*  CO2 13* 14* 14* 22 20*  GLUCOSE 100* 84 125* 108* 76  BUN 142* 140* 141* 145* 145*  CREATININE 15.47* 13.80* 13.72* 13.42* 13.67*  CALCIUM 6.9* 6.4* 6.2* 5.1* 5.1*  MG 2.3  --   --   --   --   PHOS  --  11.7* 11.6* 11.1* 11.6*      CBC: Recent Labs  Lab 12/23/18 2008 12/24/18 0500 12/26/18 0511 12/27/18 0449  WBC 19.8* 17.4* 13.4* 15.4*  NEUTROABS 17.4*  --   --   --   HGB 15.2* 13.8 12.9 13.5  HCT 42.5 38.9 35.8* 35.8*  MCV 87.4 87.4 84.8 81.9  PLT 319 277 286 268      Lab Results  Component Value Date   HEPBSAG NON REACTIVE 12/25/2018   HEPBSAB NON REACTIVE 12/25/2018      Microbiology:  Recent Results (from the past 240 hour(s))  C Difficile Quick Screen w PCR reflex     Status: None   Collection Time: 12/23/18 10:28 PM   Specimen: STOOL  Result Value Ref Range Status   C Diff antigen NEGATIVE NEGATIVE Final   C Diff toxin NEGATIVE NEGATIVE Final   C Diff interpretation No C. difficile detected.  Final    Comment: Performed at [facility], [address], [place], [place] [address]  Urine culture     Status: Abnormal   Collection Time: 12/23/18 10:28 PM   Specimen: In/Out Cath Urine  Result Value Ref Range Status   Specimen Description   Final    IN/OUT CATH URINE Performed at [facility], [address] [place], [place] [address]    Special  Requests   Final    NONE Performed at [facility], [place], [place] [address]    Culture (A)  Final    7,000 COLONIES/mL ESCHERICHIA COLI 1,000 COLONIES/mL ENTEROCOCCUS FAECALIS    Report Status 12/27/2018 FINAL  Final   Organism ID, Bacteria ESCHERICHIA COLI (A)  Final   Organism ID, Bacteria ENTEROCOCCUS FAECALIS (A)  Final      Susceptibility   Escherichia coli - MIC*    AMPICILLIN >=32 RESISTANT Resistant     CEFAZOLIN <=4 SENSITIVE Sensitive     CEFTRIAXONE <=1 SENSITIVE Sensitive     CIPROFLOXACIN >=4 RESISTANT Resistant     GENTAMICIN <=1 SENSITIVE Sensitive     IMIPENEM <=0.25 SENSITIVE Sensitive     NITROFURANTOIN <=16 SENSITIVE Sensitive     TRIMETH/SULFA >=320 RESISTANT Resistant     AMPICILLIN/SULBACTAM 16 INTERMEDIATE Intermediate     PIP/TAZO <=4 SENSITIVE Sensitive      * 7,000 COLONIES/mL ESCHERICHIA COLI   Enterococcus faecalis - MIC*    AMPICILLIN <=2 SENSITIVE Sensitive     NITROFURANTOIN <=16 SENSITIVE Sensitive     VANCOMYCIN 1 SENSITIVE Sensitive     * 1,000 COLONIES/mL ENTEROCOCCUS FAECALIS  Blood Culture (routine x 2)     Status: None (Preliminary result)   Collection Time: 12/23/18 10:29 PM   Specimen: BLOOD  Result Value Ref Range Status   Specimen Description BLOOD RIGHT ANTECUBITAL  Final   Special Requests   Final    BOTTLES DRAWN AEROBIC AND ANAEROBIC Blood Culture results may not be optimal due to an excessive volume of blood received in culture bottles   Culture   Final    NO GROWTH 4 DAYS Performed at [facility], [address] [place], [place] [address]    Report Status PENDING  Incomplete  SARS CORONAVIRUS 2 (TAT 6-24 HRS) Nasopharyngeal Nasopharyngeal Swab     Status: None   Collection Time: 12/24/18 12:07 AM   Specimen: Nasopharyngeal Swab  Result Value Ref Range Status   SARS Coronavirus 2 NEGATIVE NEGATIVE Final    Comment: (NOTE) SARS-CoV-2 target nucleic acids are NOT DETECTED. The SARS-CoV-2 RNA is generally detectable in upper and lower respiratory specimens during the acute phase of infection. Negative results do not preclude SARS-CoV-2 infection, do not rule out co-infections with other pathogens, and should not be used as the sole basis for treatment or other patient management decisions. Negative results must be combined with clinical observations, patient history, and epidemiological information. The expected result is Negative. Fact Sheet for Patients: SugarRoll.be Fact Sheet for Healthcare Providers: https://www.woods-mathews.com/ This test is not yet approved or cleared by the [place] FDA and  has been authorized for detection and/or diagnosis of SARS-CoV-2 by FDA under an Emergency Use Authorization (EUA). This EUA will remain  in effect  (meaning this test can be used) for the duration of the COVID-19 declaration under Section 56 4(b)(1) of the Act, 21 U.S.C. section 360bbb-3(b)(1), unless the authorization is terminated or revoked sooner. Performed at [facility], [place] [address] [place], [place] [address]     Coagulation Studies: Recent Labs    12/25/18 0557 12/26/18 0511 12/27/18 0449  LABPROT 15.8* 16.4* 17.1*  INR 1.3* 1.3* 1.4*    Urinalysis: Recent Labs    12/27/18 0641  COLORURINE BROWN*  LABSPEC 1.031*  PHURINE TEST NOT REPORTED DUE TO COLOR INTERFERENCE OF URINE PIGMENT  GLUCOSEU TEST NOT REPORTED DUE TO COLOR INTERFERENCE OF URINE PIGMENT*  HGBUR TEST  NOT REPORTED DUE TO COLOR INTERFERENCE OF URINE PIGMENT*  BILIRUBINUR TEST NOT REPORTED DUE TO COLOR INTERFERENCE OF URINE PIGMENT*  KETONESUR TEST NOT REPORTED DUE TO COLOR INTERFERENCE OF URINE PIGMENT*  PROTEINUR TEST NOT REPORTED DUE TO COLOR INTERFERENCE OF URINE PIGMENT*  NITRITE TEST NOT REPORTED DUE TO COLOR INTERFERENCE OF URINE PIGMENT*  LEUKOCYTESUR TEST NOT REPORTED DUE TO COLOR INTERFERENCE OF URINE PIGMENT*      Imaging: No results found.   Medications:   . dextrose 5 % and 0.9% NaCl 50 mL/hr at 12/27/18 0940  . sodium chloride     . calcium carbonate  1 tablet Oral TID WC  . Chlorhexidine Gluconate Cloth  6 each Topical Daily  . cholecalciferol  5,000 Units Oral Daily  . metoprolol tartrate  25 mg Oral BID  . sodium chloride flush  3 mL Intravenous Q12H  . warfarin  2.5 mg Oral ONCE-1800  . Warfarin - Pharmacist Dosing Inpatient   Does not apply q1800   acetaminophen, ALPRAZolam, metoprolol tartrate, sodium chloride flush  Assessment/ Plan:  78 y.o. female with Diabetes, atrial fibrillation, hypertension, history of, atrial fibrillation with chronic anticoagulation, chronic left hydronephrosis , aortic atherosclerosis, was admitted on 12/23/2018 with Sepsis [facility])  Patient Active Problem List   Diagnosis Date  Noted  . Other cirrhosis of liver ([place]) 12/25/2018  . Hydronephrosis, left 12/25/2018  . Sepsis ([place]) 12/24/2018  . AKI (acute kidney injury) ([place]) 12/24/2018  . Colitis, acute 12/24/2018  . Elevated troponin 12/24/2018  . Essential hypertension 12/24/2018  . Atrial fibrillation, chronic ([place]) 12/24/2018  . Asplenia 05/19/2017  . Rectal cancer ([place]) 04/24/2016  . Panic disorder 07/03/2015  . Allergic rhinitis 03/23/2015  . Hypertension 07/04/2014  . Hypercholesteremia 07/04/2014  . Osteopenia 07/04/2014  . Vitamin D deficiency 07/04/2014  . Anxiety 07/04/2014  . CLL (chronic lymphocytic leukemia) ([place]) 07/04/2014  . Non-insulin dependent type 2 diabetes mellitus ([place]) 07/04/2014  . Diverticulitis 07/04/2014  . H/O malignant neoplasm of colon 07/04/2014  . Adiposity 07/04/2014  . Hernia of anterior abdominal wall 03/23/2013    #Acute kidney injury -Likely ATN from concurrent illness, possibly sepsis -Patient has chronic left hydronephrosis known at least since 2015, although patient reports no knowledge of this condition. Baseline creatinine of 0.89 from April 2020 -Patient has been evaluated by urology team. no intervention. -Screening serologies -ANA negative, ANCA negative, complements normal, kappa lambda light chain ratio normal  No significant improvement in renal function so far Urine output remains poor Hyperuricemia and hyperphosphatemia is noted.  Rhabdomyolysis ruled out as CK is normal  -Patient has agreed for dialysis on Monday.  We will request vascular surgery to place PermCath -Patient may end up needing a kidney biopsy for definitive diagnosis although it is a complicated due to solitary functioning kidney.  #Cirrhosis of the liver -Bilirubin 2.1, albumin 3.0 -Patient reports no prior knowledge of this issue - hepatitis B serology neg  #Acidosis -High anion gap likely due to renal failure -Improved with IV bicarbonate.  #Diarrhea -C. difficile  studies are negative -CT abdomen, noncontrast study reports focal areas of thickening involving cecum and ascending colon  #Non-insulin-dependent diabetes mellitus - Lab Results  Component Value Date   HGBA1C 6.1 (H) 12/24/2018    Results for CELISE, BAZAR (MRN [id number]) as of 12/27/2018 12:41  Ref. Range 12/24/2018 05:00  Cytoplasmic (C-ANCA) Latest Ref Range: Neg:<1:20 titer <1:20  P-ANCA Latest Ref Range: Neg:<1:20 titer <1:20  Atypical P-ANCA titer Latest Ref Range: Neg:<1:20 titer <1:20  C3 Complement Latest Ref Range: 82 - 167 mg/dL 124  Complement C4, Body Fluid Latest Ref Range: 12 - 38 mg/dL 28  Kappa free light chain Latest Ref Range: 3.3 - 19.4 mg/L 257.0 (H)  Lamda free light chains Latest Ref Range: 5.7 - 26.3 mg/L 162.0 (H)  Kappa, lamda light chain ratio Latest Ref Range: 0.26 - 1.65  1.59     LOS: 3 Danielle Rowland 12/13/202012:40 PM  [place], [place]  Note: This note was prepared with Dragon dictation. Any transcription errors are unintentional

## 2018-12-27 NOTE — Progress Notes (Addendum)
Informed consent for HD obtained with pt and sister, Shauna Hugh both stating understanding and agreement. Imodium given x 2 for liquid brown incontinent stools. Poor po intake. Blood sugar stable with D5NS and current plan of care. Pt has not voided this shift. GI panel still pending- pt remains in enteric precautions.

## 2018-12-28 ENCOUNTER — Encounter: Payer: Self-pay | Admitting: Internal Medicine

## 2018-12-28 ENCOUNTER — Encounter
Admission: EM | Disposition: A | Discharge status: Skilled Nursing Facility | Payer: Self-pay | Source: Home / Self Care | Attending: Internal Medicine

## 2018-12-28 ENCOUNTER — Other Ambulatory Visit (INDEPENDENT_AMBULATORY_CARE_PROVIDER_SITE_OTHER): Payer: Self-pay | Admitting: Vascular Surgery

## 2018-12-28 DIAGNOSIS — R112 Nausea with vomiting, unspecified: Secondary | ICD-10-CM

## 2018-12-28 DIAGNOSIS — I4891 Unspecified atrial fibrillation: Secondary | ICD-10-CM

## 2018-12-28 DIAGNOSIS — A419 Sepsis, unspecified organism: Secondary | ICD-10-CM

## 2018-12-28 DIAGNOSIS — N185 Chronic kidney disease, stage 5: Secondary | ICD-10-CM

## 2018-12-28 DIAGNOSIS — R6520 Severe sepsis without septic shock: Secondary | ICD-10-CM

## 2018-12-28 DIAGNOSIS — N179 Acute kidney failure, unspecified: Secondary | ICD-10-CM

## 2018-12-28 DIAGNOSIS — R197 Diarrhea, unspecified: Secondary | ICD-10-CM

## 2018-12-28 HISTORY — PX: DIALYSIS/PERMA CATHETER INSERTION: CATH118288

## 2018-12-28 LAB — GI PATHOGEN PANEL BY PCR, STOOL

## 2018-12-28 LAB — CBC
HCT: 38.6 % (ref 36.0–46.0)
Hemoglobin: 13.9 g/dL (ref 12.0–15.0)
MCH: 30.8 pg (ref 26.0–34.0)
MCHC: 36 g/dL (ref 30.0–36.0)
MCV: 85.4 fL (ref 80.0–100.0)
Platelets: 298 10*3/uL (ref 150–400)
RBC: 4.52 MIL/uL (ref 3.87–5.11)
RDW: 14.1 % (ref 11.5–15.5)
WBC: 13 10*3/uL — ABNORMAL HIGH (ref 4.0–10.5)
nRBC: 1.5 % — ABNORMAL HIGH (ref 0.0–0.2)

## 2018-12-28 LAB — RENAL FUNCTION PANEL
Albumin: 2.2 g/dL — ABNORMAL LOW (ref 3.5–5.0)
Anion gap: 26 — ABNORMAL HIGH (ref 5–15)
BUN: 163 mg/dL — ABNORMAL HIGH (ref 8–23)
CO2: 18 mmol/L — ABNORMAL LOW (ref 22–32)
Calcium: 5.3 mg/dL — CL (ref 8.9–10.3)
Chloride: 83 mmol/L — ABNORMAL LOW (ref 98–111)
Creatinine, Ser: 14.07 mg/dL — ABNORMAL HIGH (ref 0.44–1.00)
GFR calc Af Amer: 3 mL/min — ABNORMAL LOW (ref >60)
GFR calc non Af Amer: 2 mL/min — ABNORMAL LOW (ref >60)
Glucose, Bld: 109 mg/dL — ABNORMAL HIGH (ref 70–99)
Phosphorus: 11.8 mg/dL — ABNORMAL HIGH (ref 2.5–4.6)
Potassium: 5.1 mmol/L (ref 3.5–5.1)
Sodium: 127 mmol/L — ABNORMAL LOW (ref 135–145)

## 2018-12-28 LAB — PROTIME-INR
INR: 1.5 — ABNORMAL HIGH (ref 0.8–1.2)
Prothrombin Time: 17.7 seconds — ABNORMAL HIGH (ref 11.4–15.2)

## 2018-12-28 LAB — CULTURE, BLOOD (ROUTINE X 2): Culture: NO GROWTH

## 2018-12-28 LAB — GLUCOSE, CAPILLARY
Glucose-Capillary: 113 mg/dL — ABNORMAL HIGH (ref 70–99)
Glucose-Capillary: 124 mg/dL — ABNORMAL HIGH (ref 70–99)
Glucose-Capillary: 103 mg/dL — ABNORMAL HIGH (ref 70–99)
Glucose-Capillary: 99 mg/dL (ref 70–99)
Glucose-Capillary: 94 mg/dL (ref 70–99)
Glucose-Capillary: 100 mg/dL — ABNORMAL HIGH (ref 70–99)

## 2018-12-28 SURGERY — DIALYSIS/PERMA CATHETER INSERTION
Anesthesia: Choice

## 2018-12-28 MED ORDER — MIDAZOLAM HCL 2 MG/ML PO SYRP: 8.0000 mg | ORAL_SOLUTION | Freq: Once | ORAL | Status: DC | PRN

## 2018-12-28 MED ORDER — CALCIUM CARBONATE ANTACID 500 MG PO CHEW
200.0000 mg | CHEWABLE_TABLET | Freq: Three times a day (TID) | ORAL | Status: DC
  Administered 2018-12-29 – 2019-01-01 (×6): 200 mg via ORAL
  Filled 2018-12-28 (×7): qty 1

## 2018-12-28 MED ORDER — DIPHENHYDRAMINE HCL 50 MG/ML IJ SOLN
INTRAMUSCULAR | Status: DC | PRN
  Administered 2018-12-28: 25 mg via INTRAVENOUS

## 2018-12-28 MED ORDER — CLINDAMYCIN PHOSPHATE 300 MG/50ML IV SOLN: 300.0000 mg | Freq: Once | INTRAVENOUS | Status: AC

## 2018-12-28 MED ORDER — METHYLPREDNISOLONE SODIUM SUCC 125 MG IJ SOLR
INTRAMUSCULAR | Status: AC
  Administered 2018-12-28: 125 mg via INTRAVENOUS
  Filled 2018-12-28: qty 2

## 2018-12-28 MED ORDER — WARFARIN SODIUM 2.5 MG PO TABS
2.5000 mg | ORAL_TABLET | Freq: Once | ORAL | Status: DC
  Filled 2018-12-28: qty 1

## 2018-12-28 MED ORDER — SODIUM CHLORIDE 0.9 % IV SOLN: INTRAVENOUS | Status: DC

## 2018-12-28 MED ORDER — DIPHENHYDRAMINE HCL 50 MG/ML IJ SOLN: 50.0000 mg | Freq: Once | INTRAMUSCULAR | Status: DC | PRN

## 2018-12-28 MED ORDER — ONDANSETRON HCL 4 MG/2ML IJ SOLN
4.0000 mg | Freq: Four times a day (QID) | INTRAMUSCULAR | Status: DC | PRN
  Administered 2019-01-01: 4 mg via INTRAVENOUS
  Filled 2018-12-28: qty 2

## 2018-12-28 MED ORDER — HYDROMORPHONE HCL 1 MG/ML IJ SOLN: 1.0000 mg | Freq: Once | INTRAMUSCULAR | Status: DC | PRN

## 2018-12-28 MED ORDER — CLINDAMYCIN PHOSPHATE 300 MG/50ML IV SOLN
INTRAVENOUS | Status: AC
  Administered 2018-12-28: 300 mg via INTRAVENOUS
  Filled 2018-12-28: qty 50

## 2018-12-28 MED ORDER — METHYLPREDNISOLONE SODIUM SUCC 125 MG IJ SOLR: 125.0000 mg | Freq: Once | INTRAMUSCULAR | Status: AC | PRN

## 2018-12-28 MED ORDER — MIDAZOLAM HCL 5 MG/5ML IJ SOLN
INTRAMUSCULAR | Status: AC
  Filled 2018-12-28: qty 5

## 2018-12-28 MED ORDER — FENTANYL CITRATE (PF) 100 MCG/2ML IJ SOLN
INTRAMUSCULAR | Status: AC
  Filled 2018-12-28: qty 2

## 2018-12-28 MED ORDER — MIDAZOLAM HCL 2 MG/2ML IJ SOLN
INTRAMUSCULAR | Status: DC | PRN
  Administered 2018-12-28: 2 mg via INTRAVENOUS
  Administered 2018-12-28: 1 mg via INTRAVENOUS

## 2018-12-28 MED ORDER — FAMOTIDINE 20 MG PO TABS: 40.0000 mg | ORAL_TABLET | Freq: Once | ORAL | Status: DC | PRN

## 2018-12-28 MED ORDER — FENTANYL CITRATE (PF) 100 MCG/2ML IJ SOLN
INTRAMUSCULAR | Status: DC | PRN
  Administered 2018-12-28: 25 ug via INTRAVENOUS
  Administered 2018-12-28: 50 ug via INTRAVENOUS

## 2018-12-28 MED ORDER — DIPHENHYDRAMINE HCL 50 MG/ML IJ SOLN
INTRAMUSCULAR | Status: AC
  Filled 2018-12-28: qty 1

## 2018-12-28 SURGICAL SUPPLY — 14 items
BALLN ULTRVRSE 7X80X75 (BALLOONS) ×3
BALLOON ULTRVRSE 7X80X75 (BALLOONS) ×1 IMPLANT
CANNULA 5F STIFF (CANNULA) ×6 IMPLANT
CATH PALINDROME RT-P 15FX23CM (CATHETERS) ×3 IMPLANT
DERMABOND ADVANCED (GAUZE/BANDAGES/DRESSINGS) ×2
DERMABOND ADVANCED .7 DNX12 (GAUZE/BANDAGES/DRESSINGS) ×1 IMPLANT
DEVICE PRESTO INFLATION (MISCELLANEOUS) ×3 IMPLANT
DEVICE TORQUE .025-.038 (MISCELLANEOUS) ×3 IMPLANT
GLIDEWIRE STIFF .35X180X3 HYDR (WIRE) ×3 IMPLANT
PACK ANGIOGRAPHY (CUSTOM PROCEDURE TRAY) ×3 IMPLANT
SHEATH BRITE TIP 6FRX11 (SHEATH) ×3 IMPLANT
SUT MNCRL AB 4-0 PS2 18 (SUTURE) ×3 IMPLANT
SUT PROLENE 0 CT 1 30 (SUTURE) ×3 IMPLANT
WIRE MAGIC TOR.035 180C (WIRE) ×3 IMPLANT

## 2018-12-28 NOTE — H&P (Signed)
[facility] History & Physical Update  The patient was interviewed and re-examined.  The patient's previous History and Physical has been reviewed and is unchanged.  There is no change in the plan of care. We plan to proceed with the scheduled procedure.  Leotis Pain, MD  12/28/2018, 3:15 PM

## 2018-12-28 NOTE — Progress Notes (Signed)
[place], [place] 12/28/18  Subjective:  Patient seen at bedside. PermCath to be placed today. First dialysis treatment also scheduled for today.   Objective:  Vital signs in last 24 hours:  Temp:  [96.5 F (35.8 C)-98 F (36.7 C)] 96.5 F (35.8 C) (12/14 1514) Pulse Rate:  [66-132] 101 (12/14 1700) Resp:  [13-26] 23 (12/14 1700) BP: (106-131)/(77-107) 123/82 (12/14 1700) SpO2:  [91 %-99 %] 95 % (12/14 1700)  Weight change:  Filed Weights   12/23/18 1943  Weight: 90.7 kg    Intake/Output:   No intake or output data in the 24 hours ending 12/28/18 1715 Physical Exam: General:  Chronically ill-appearing, frail, laying in the bed  HEENT  moist oral mucous membranes  Lungs:  Room air, basilar rales  Heart::  Irregular, no obvious rub  Abdomen:  Soft, nontender, previous surgical scar  Extremities:  1+ LE edmea  Neurologic:  Alert, able to follow commands  Skin:  No acute rashes  Access:  L IJ permcath       Basic Metabolic Panel:  Recent Labs  Lab 12/23/18 2008 12/24/18 0500 12/25/18 0019 12/26/18 0511 12/27/18 0449 12/28/18 0441  NA 130* 130* 130* 130* 128* 127*  K 4.7 4.5 4.3 4.1 4.6 5.1  CL 86* 91* 89* 83* 82* 83*  CO2 13* 14* 14* 22 20* 18*  GLUCOSE 100* 84 125* 108* 76 109*  BUN 142* 140* 141* 145* 145* 163*  CREATININE 15.47* 13.80* 13.72* 13.42* 13.67* 14.07*  CALCIUM 6.9* 6.4* 6.2* 5.1* 5.1* 5.3*  MG 2.3  --   --   --   --   --   PHOS  --  11.7* 11.6* 11.1* 11.6* 11.8*     CBC: Recent Labs  Lab 12/23/18 2008 12/24/18 0500 12/26/18 0511 12/27/18 0449 12/28/18 0441  WBC 19.8* 17.4* 13.4* 15.4* 13.0*  NEUTROABS 17.4*  --   --   --   --   HGB 15.2* 13.8 12.9 13.5 13.9  HCT 42.5 38.9 35.8* 35.8* 38.6  MCV 87.4 87.4 84.8 81.9 85.4  PLT 319 277 286 268 298      Lab Results  Component Value Date   HEPBSAG NON REACTIVE 12/25/2018   HEPBSAB NON REACTIVE 12/25/2018      Microbiology:  Recent Results  (from the past 240 hour(s))  GI pathogen panel by PCR, stool     Status: None   Collection Time: 12/23/18 10:28 PM   Specimen: STOOL  Result Value Ref Range Status   Plesiomonas shigelloides NOT DETECTED NOT DETECTED Final   Yersinia enterocolitica NOT DETECTED NOT DETECTED Final   Vibrio NOT DETECTED NOT DETECTED Final   Enteropathogenic E coli NOT DETECTED NOT DETECTED Final   E coli (ETEC) LT/ST NOT DETECTED NOT DETECTED Final   E coli 4315 by PCR Not applicable NOT DETECTED Final   Cryptosporidium by PCR NOT DETECTED NOT DETECTED Final   Entamoeba histolytica NOT DETECTED NOT DETECTED Final   Adenovirus F 40/41 NOT DETECTED NOT DETECTED Final   Norovirus GI/GII NOT DETECTED NOT DETECTED Final   Sapovirus NOT DETECTED NOT DETECTED Final    Comment: (NOTE) Performed At: [facility] [place], [place] [id number] Rush Farmer MD JK:[id number]    Vibrio cholerae NOT DETECTED NOT DETECTED Final   Campylobacter by PCR NOT DETECTED NOT DETECTED Final   Salmonella by PCR NOT DETECTED NOT DETECTED Final   E coli (STEC) NOT DETECTED NOT DETECTED Final   Enteroaggregative  E coli NOT DETECTED NOT DETECTED Final   Shigella by PCR NOT DETECTED NOT DETECTED Final   Cyclospora cayetanensis NOT DETECTED NOT DETECTED Final   Astrovirus NOT DETECTED NOT DETECTED Final   G lamblia by PCR NOT DETECTED NOT DETECTED Final   Rotavirus A by PCR NOT DETECTED NOT DETECTED Final  C Difficile Quick Screen w PCR reflex     Status: None   Collection Time: 12/23/18 10:28 PM   Specimen: STOOL  Result Value Ref Range Status   C Diff antigen NEGATIVE NEGATIVE Final   C Diff toxin NEGATIVE NEGATIVE Final   C Diff interpretation No C. difficile detected.  Final    Comment: Performed at [facility], [address] [place], [place] [address]  Urine culture     Status: Abnormal   Collection Time: 12/23/18 10:28 PM   Specimen: In/Out Cath Urine  Result Value Ref Range Status    Specimen Description   Final    IN/OUT CATH URINE Performed at [facility], [address] [place], [place] [address]    Special Requests   Final    NONE Performed at [facility], [address] [place], [place] [address]    Culture (A)  Final    7,000 COLONIES/mL ESCHERICHIA COLI 1,000 COLONIES/mL ENTEROCOCCUS FAECALIS    Report Status 12/27/2018 FINAL  Final   Organism ID, Bacteria ESCHERICHIA COLI (A)  Final   Organism ID, Bacteria ENTEROCOCCUS FAECALIS (A)  Final      Susceptibility   Escherichia coli - MIC*    AMPICILLIN >=32 RESISTANT Resistant     CEFAZOLIN <=4 SENSITIVE Sensitive     CEFTRIAXONE <=1 SENSITIVE Sensitive     CIPROFLOXACIN >=4 RESISTANT Resistant     GENTAMICIN <=1 SENSITIVE Sensitive     IMIPENEM <=0.25 SENSITIVE Sensitive     NITROFURANTOIN <=16 SENSITIVE Sensitive     TRIMETH/SULFA >=320 RESISTANT Resistant     AMPICILLIN/SULBACTAM 16 INTERMEDIATE Intermediate     PIP/TAZO <=4 SENSITIVE Sensitive     * 7,000 COLONIES/mL ESCHERICHIA COLI   Enterococcus faecalis - MIC*    AMPICILLIN <=2 SENSITIVE Sensitive     NITROFURANTOIN <=16 SENSITIVE Sensitive     VANCOMYCIN 1 SENSITIVE Sensitive     * 1,000 COLONIES/mL ENTEROCOCCUS FAECALIS  Blood Culture (routine x 2)     Status: None   Collection Time: 12/23/18 10:29 PM   Specimen: BLOOD  Result Value Ref Range Status   Specimen Description BLOOD RIGHT ANTECUBITAL  Final   Special Requests   Final    BOTTLES DRAWN AEROBIC AND ANAEROBIC Blood Culture results may not be optimal due to an excessive volume of blood received in culture bottles   Culture   Final    NO GROWTH 5 DAYS Performed at [facility], [address] [place],  [address]    Report Status 12/28/2018 FINAL  Final  SARS CORONAVIRUS 2 (TAT 6-24 HRS) Nasopharyngeal Nasopharyngeal Swab     Status: None   Collection Time: 12/24/18 12:07 AM   Specimen: Nasopharyngeal Swab  Result Value Ref Range Status    SARS Coronavirus 2 NEGATIVE NEGATIVE Final    Comment: (NOTE) SARS-CoV-2 target nucleic acids are NOT DETECTED. The SARS-CoV-2 RNA is generally detectable in upper and lower respiratory specimens during the acute phase of infection. Negative results do not preclude SARS-CoV-2 infection, do not rule out co-infections with other pathogens, and should not be used as the sole basis for treatment or other patient  management decisions. Negative results must be combined with clinical observations, patient history, and epidemiological information. The expected result is Negative. Fact Sheet for Patients: SugarRoll.be Fact Sheet for Healthcare Providers: https://www.woods-mathews.com/ This test is not yet approved or cleared by the [place] FDA and  has been authorized for detection and/or diagnosis of SARS-CoV-2 by FDA under an Emergency Use Authorization (EUA). This EUA will remain  in effect (meaning this test can be used) for the duration of the COVID-19 declaration under Section 56 4(b)(1) of the Act, 21 U.S.C. section 360bbb-3(b)(1), unless the authorization is terminated or revoked sooner. Performed at [facility], [place] [address], [place], [place] [address]   Urine Culture     Status: None (Preliminary result)   Collection Time: 12/27/18  6:41 AM   Specimen: Urine, Random  Result Value Ref Range Status   Specimen Description   Final    URINE, RANDOM Performed at [facility], [address] [place], [place] [address]    Special Requests   Final    NONE Performed at [facility], [address], [place], [place] [address]    Culture   Final    CULTURE REINCUBATED FOR BETTER GROWTH Performed at [facility], [place] [address], [place], [place] [address]    Report Status PENDING  Incomplete    Coagulation Studies: Recent Labs    12/26/18 0511 12/27/18 0449 12/28/18 0441  LABPROT 16.4* 17.1*  17.7*  INR 1.3* 1.4* 1.5*    Urinalysis: Recent Labs    12/27/18 0641  COLORURINE [place]*  LABSPEC 1.031*  PHURINE TEST NOT REPORTED DUE TO COLOR INTERFERENCE OF URINE PIGMENT  GLUCOSEU TEST NOT REPORTED DUE TO COLOR INTERFERENCE OF URINE PIGMENT*  HGBUR TEST NOT REPORTED DUE TO COLOR INTERFERENCE OF URINE PIGMENT*  BILIRUBINUR TEST NOT REPORTED DUE TO COLOR INTERFERENCE OF URINE PIGMENT*  KETONESUR TEST NOT REPORTED DUE TO COLOR INTERFERENCE OF URINE PIGMENT*  PROTEINUR TEST NOT REPORTED DUE TO COLOR INTERFERENCE OF URINE PIGMENT*  NITRITE TEST NOT REPORTED DUE TO COLOR INTERFERENCE OF URINE PIGMENT*  LEUKOCYTESUR TEST NOT REPORTED DUE TO COLOR INTERFERENCE OF URINE PIGMENT*      Imaging: PERIPHERAL VASCULAR CATHETERIZATION  Result Date: 12/28/2018 See op note    Medications:   . sodium chloride    . dextrose 5 % and 0.9% NaCl 50 mL/hr at 12/28/18 0614  . [MAR Hold] sodium chloride     . [MAR Hold] calcium carbonate  1 tablet Oral TID WC  . [MAR Hold] Chlorhexidine Gluconate Cloth  6 each Topical Daily  . [MAR Hold] cholecalciferol  5,000 Units Oral Daily  . diphenhydrAMINE      . fentaNYL      . [MAR Hold] metoprolol tartrate  25 mg Oral BID  . midazolam      . [MAR Hold] sodium chloride flush  3 mL Intravenous Q12H  . [MAR Hold] warfarin  2.5 mg Oral ONCE-1800  . [MAR Hold] Warfarin - Pharmacist Dosing Inpatient   Does not apply [id number]   [MAR Hold] acetaminophen, [MAR Hold] ALPRAZolam, diphenhydrAMINE, famotidine, HYDROmorphone (DILAUDID) injection, [MAR Hold] loperamide, [MAR Hold] metoprolol tartrate, midazolam, ondansetron (ZOFRAN) IV, [MAR Hold] sodium chloride flush  Assessment/ Plan:  78 y.o. female with Diabetes, atrial fibrillation, hypertension, history of, atrial fibrillation with chronic anticoagulation, chronic left hydronephrosis , aortic atherosclerosis, was admitted on 12/23/2018 with Sepsis [facility])  Patient Active Problem List   Diagnosis Date Noted   . Other cirrhosis of liver (  [place]) 12/25/2018  . Hydronephrosis, left 12/25/2018  . Sepsis ([place]) 12/24/2018  . AKI (acute kidney injury) ([place]) 12/24/2018  . Colitis, acute 12/24/2018  . Elevated troponin 12/24/2018  . Essential hypertension 12/24/2018  . Atrial fibrillation, chronic ([place]) 12/24/2018  . Asplenia 05/19/2017  . Rectal cancer ([place]) 04/24/2016  . Panic disorder 07/03/2015  . Allergic rhinitis 03/23/2015  . Hypertension 07/04/2014  . Hypercholesteremia 07/04/2014  . Osteopenia 07/04/2014  . Vitamin D deficiency 07/04/2014  . Anxiety 07/04/2014  . CLL (chronic lymphocytic leukemia) ([place]) 07/04/2014  . Non-insulin dependent type 2 diabetes mellitus ([place]) 07/04/2014  . Diverticulitis 07/04/2014  . H/O malignant neoplasm of colon 07/04/2014  . Adiposity 07/04/2014  . Hernia of anterior abdominal wall 03/23/2013    #Acute kidney injury/hyperphosphatemia -Likely ATN from concurrent illness, possibly sepsis -Patient has chronic left hydronephrosis known at least since 2015, although patient reports no knowledge of this condition. Baseline creatinine of 0.89 from April 2020 -Patient has been evaluated by urology team. no intervention. -Screening serologies -ANA negative, ANCA negative, complements normal, kappa lambda light chain ratio normal  -Patient continues to have significantly diminished renal function.  PermCath placed.  Appreciate vascular surgery assistance.  First dialysis treatment today and second Alysis treatment scheduled for tomorrow.Marland Kitchen  #Cirrhosis of the liver -Bilirubin 2.1, albumin 3.0 -Patient reports no prior knowledge of this issue - hepatitis B serology neg  #Acidosis -Should improve quickly with dialysis treatment.  #Diarrhea -C. difficile studies are negative -CT abdomen, noncontrast study reports focal areas of thickening involving cecum and ascending colon  #Non-insulin-dependent diabetes mellitus - Lab Results  Component Value Date    HGBA1C 6.1 (H) 12/24/2018    Results for YOLANDER, GOODIE (MRN [id number]) as of 12/27/2018 12:41  Ref. Range 12/24/2018 05:00  Cytoplasmic (C-ANCA) Latest Ref Range: Neg:<1:20 titer <1:20  P-ANCA Latest Ref Range: Neg:<1:20 titer <1:20  Atypical P-ANCA titer Latest Ref Range: Neg:<1:20 titer <1:20  C3 Complement Latest Ref Range: 82 - 167 mg/dL 124  Complement C4, Body Fluid Latest Ref Range: 12 - 38 mg/dL 28  Kappa free light chain Latest Ref Range: 3.3 - 19.4 mg/L 257.0 (H)  Lamda free light chains Latest Ref Range: 5.7 - 26.3 mg/L 162.0 (H)  Kappa, lamda light chain ratio Latest Ref Range: 0.26 - 1.65  1.59     LOS: 4 Raeana Blinn 12/14/20205:15 PM  [employer], [place]  Note: This note was prepared with Dragon dictation. Any transcription errors are unintentional

## 2018-12-28 NOTE — Op Note (Signed)
OPERATIVE NOTE    PRE-OPERATIVE DIAGNOSIS: 1. ESRD   POST-OPERATIVE DIAGNOSIS: same as above  PROCEDURE: 1. Ultrasound guidance for vascular access to the left internal and external jugular vein 2. Catheter placement into SVC from left external jugular approach 3. Left jugular venogram and superior venacavogram 4. Percutaneous transluminal angioplasty of left innominate vein and external jugular vein with 7 mm diameter angioplasty balloon 5. Fluoroscopic guidance for placement of catheter 6. Placement of a 23 cm tip to cuff tunneled hemodialysis catheter via the left internal jugular vein  SURGEON: Leotis Pain, MD  ANESTHESIA:  Local with Moderate conscious sedation for approximately 30 minutes using 3 mg of Versed and 75 mcg of Fentanyl  ESTIMATED BLOOD LOSS: 15 cc  FLUORO TIME: 4.9 minutes  CONTRAST: 15 cc  FINDING(S): 1.  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 female who presents with renal failure.  The patient needs long term dialysis access for their ESRD, and a Permcath is necessary.  Risks and benefits are discussed and informed consent is obtained.  She has a Port-A-Cath on the right side for treatment for her malignancy  DESCRIPTION: After obtaining full informed written consent, the patient was brought back to the vascular suited. The patient's left neck and chest were sterilely prepped and draped in a sterile surgical field was created. Moderate conscious sedation was administered during a face to face encounter with the patient throughout the procedure with my supervision of the RN administering medicines and monitoring the patient's vital signs, pulse oximetry, telemetry and mental status throughout from the start of the procedure until the patient was taken to the recovery room.   The left internal jugular vein was visualized with ultrasound and found to be patent high in the neck although it was not well seen in the lower part of the neck. It was then accessed under direct ultrasound guidance and a permanent image was recorded. A wire was placed but would not cross the clavicle.  A micropuncture sheath was then placed and imaging was performed showing occlusion of the left internal jugular vein with anterior jugular vein collaterals feeding into the right side that would not be appropriate for PermCath placement.  I then turned my attention to the left external jugular vein.  This was of reasonable size.  This was accessed under direct ultrasound guidance with a micropuncture needle and a permanent images recorded.  A micropuncture wire was placed but would not pass easily into the external jugular vein.  I performed imaging with the micropuncture sheath which showed a highly narrowed left external jugular vein as it transitioned into the innominate vein on the left with greater than 70% narrowing.  Using fluoroscopic guidance, was able to cross this lesion with a Glidewire and a catheter parking a catheter into the superior vena cava and then exchanging for a Magic torque wire and parking this down into the inferior vena cava.  A 6 French sheath was then placed and I elected to perform balloon angioplasty of the left innominate vein and proximal external jugular vein.  A 7 mm diameter by 8 cm length angioplasty balloon was inflated to 10 atm for 1 minute.  Completion imaging showed significant improvement in flow although the degree of residual stenosis was still in the 40 to 50% range, I would now allow traverse  much of the dilator to get a catheter in.  I sequentially dilated over the tract.  After skin nick and dilatation, the peel-away sheath was placed over the wire. I then turned my attention to an area under the clavicle. Approximately 1-2 fingerbreadths below the clavicle a small  counterincision was created and tunneled from the subclavicular incision to the access site. Using fluoroscopic guidance, a 23 centimeter tip to cuff tunneled hemodialysis catheter was selected, and tunneled from the subclavicular incision to the access site. It was then placed through the peel-away sheath and the peel-away sheath was removed. Using fluoroscopic guidance the catheter tips were parked in the right atrium. The appropriate distal connectors were placed. It withdrew blood well and flushed easily with heparinized saline and a concentrated heparin solution was then placed. It was secured to the chest wall with 2 Prolene sutures. The access incision was closed single 4-0 Monocryl. A 4-0 Monocryl pursestring suture was placed around the exit site. Sterile dressings were placed. The patient tolerated the procedure well and was taken to the recovery room in stable condition.  COMPLICATIONS: None  CONDITION: Stable  Leotis Pain  12/28/2018, 4:28 PM   This note was created with Dragon Medical transcription system. Any errors in dictation are purely unintentional.

## 2018-12-28 NOTE — Progress Notes (Signed)
This note also relates to the following rows which could not be included: Pulse Rate - Cannot attach notes to unvalidated device data Resp - Cannot attach notes to unvalidated device data BP - Cannot attach notes to unvalidated device data SpO2 - Cannot attach notes to unvalidated device data  Hd started  

## 2018-12-28 NOTE — Consult Note (Signed)
[place] for Warfarin Indication: atrial fibrillation  Allergies  Allergen Reactions  . Influenza Vaccines   . Iodinated Diagnostic Agents     IVP Dye  . Metformin Hcl Nausea Only and Nausea And Vomiting  . Penicillins   . Sulfa Antibiotics     Patient Measurements: Height: 5' (152.4 cm) Weight: 200 lb (90.7 kg) IBW/kg (Calculated) : 45.5   Vital Signs: Temp: 97.4 F (36.3 C) (12/13 2229) Temp Source: Oral (12/13 2229) BP: 109/78 (12/13 2229) Pulse Rate: 82 (12/13 2229)  Labs: Recent Labs    12/26/18 0511 12/27/18 0449 12/28/18 0441  HGB 12.9 13.5 13.9  HCT 35.8* 35.8* 38.6  PLT 286 268 298  LABPROT 16.4* 17.1* 17.7*  INR 1.3* 1.4* 1.5*  CREATININE 13.42* 13.67* 14.07*    Estimated Creatinine Clearance: 3.4 mL/min (A) (by C-G formula based on SCr of 14.07 mg/dL (H)).   Medications:  Warfarin 1 mg daily- last dose 12/23/18  Assessment: Pharmacy has been consulted for warfarin in patient with a PMH significant for atrial fibrillation.   Date INR  Dose  12/9 1.3 ----- 12/10 1.3       2 mg 12/11   1.3 2 mg 12/12 1.3 2.5 mg 12/13 1.4 2.5mg  12/14 1.5   Goal of Therapy:  INR 2-3 Monitor platelets by anticoagulation protocol: Yes   Plan:  INR Remains subtherapeutic. Will give warfarin 2.5 mg again tonight. INR with am labs. CBC q72h per protocol.  Pernell Dupre, PharmD, BCPS Clinical Pharmacist 12/28/2018 9:52 AM

## 2018-12-28 NOTE — Progress Notes (Signed)
This note also relates to the following rows which could not be included: Pulse Rate - Cannot attach notes to unvalidated device data Resp - Cannot attach notes to unvalidated device data  Hd completed  
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
## 2018-12-28 NOTE — Progress Notes (Addendum)
PROGRESS NOTE    Danielle Rowland  ZOX:[id number] DOB: 1940/04/17 DOA: 12/23/2018 PCP: Birdie Sons, MD    Brief Narrative:  78 y/o female with history of diabetes, A fib on coumadin, HTN and CLL, presents with 4 day history of diarrhea and vomiting. She was noted to be in acute renal failure with creatinine of 13, metabolic acidosis and elevated wbc count. CT abdomen shows evidence of colitis. C diff was found to be negative. Nephrology following for renal failure.   Assessment & Plan:   Principal Problem:   Sepsis ([place]) Active Problems:   CLL (chronic lymphocytic leukemia) (HCC)   Non-insulin dependent type 2 diabetes mellitus (HCC)   AKI (acute kidney injury) ([place])   Colitis, acute   Elevated troponin   Essential hypertension   Atrial fibrillation, chronic (HCC)   Other cirrhosis of liver (HCC)   Hydronephrosis, left   1. Acute colitis.  Sepsis on admission ruled out. Continue IV hydration.  Stool for C. difficile and GI pathogen panel negative.  Blood culture has shown no growth. Overall diarrhea improving.  She is afebrile at this time.  Lactic acidosis is improving with IV hydration. 2. Acute kidney injury.  Likely related to dehydration from diarrhea/colitis.  Nephrology consulted.  Renal ultrasound showed left-sided hydronephrosis, but this was a chronic finding.  Seen by urology and stent placement was not felt to be indicated. CK levels noted to be normal.  She is being treated with IV fluids but did not have any significant urine output.  Discussed with nephrology, as well as patient.  It is felt that dialysis would likely be helpful for this patient.  Plan is for likely to undergo dialysis on 12/14 after PermCath placement.   3. Metabolic acidosis.  Suspect is related to GI losses dehydration.  Resolved with bicarbonate infusion 4. Elevated troponin.  Mild.  Suspect this is demand ischemia in the setting of acute illness. 5. Type 2 diabetes, non-insulin-dependent,  hold oral agents.  She is having episodes of hypoglycemia.  Continue on dextrose in maintenance fluid.  A1c of 6.1. 6. Chronic atrial fibrillation.  Presented with rapid ventricular response in the setting of dehydration and acute illness.  She is anticoagulated with Coumadin.    Continue rate control with metoprolol 7. Chronic lymphocytic leukemia.  Follow-up with oncology as an outpatient. 8. Cirrhosis.  Unclear etiology.  Viral hepatitis markers are negative. 9. Hypocalcemia.  Calcium noted to be 5.3 this morning.  When corrected for albumin, it is still low at 7.1.  Ionized calcium has been less than 3.  Patient does not appear to be having any symptoms.  Will continue to supplement with p.o. calcium for now since her phosphorus is significantly elevated   DVT prophylaxis: coumadin Code Status: DNR Family Communication: discussed with sister over the phone on 12/12 Disposition Plan: pending hospital course   Consultants:   Nephrology  Urology  Vascular surgery  Procedures:   Permacath placement on 12/14  Antimicrobials:       Subjective: No shortness of breath.  No chest pain.  Feels the diarrhea is better.  Objective: Vitals:   12/28/18 1646 12/28/18 1700 12/28/18 1723 12/28/18 1920  BP: 113/80 123/82 132/85 128/88  Pulse: (!) 132 (!) 101 99 (!) 114  Resp: (!) 22 (!) 23 20 (!) 23  Temp:   98 F (36.7 C)   TempSrc:      SpO2: 91% 95% 98%   Weight:      Height:  Intake/Output Summary (Last 24 hours) at 12/28/2018 1931 Last data filed at 12/28/2018 1700 Gross per 24 hour  Intake 1062.61 ml  Output --  Net 1062.61 ml   Filed Weights   12/23/18 1943  Weight: 90.7 kg    Examination:  General exam: Alert, awake, oriented x 3 Respiratory system: Clear to auscultation. Respiratory effort normal. Cardiovascular system:irregular. No murmurs, rubs, gallops. Gastrointestinal system: Abdomen is nondistended, soft and nontender. No organomegaly or masses  felt. Normal bowel sounds heard. Central nervous system: Alert and oriented. No focal neurological deficits. Extremities: 1+ edema bilaterally Skin: No rashes, lesions or ulcers Psychiatry: Judgement and insight appear normal. Mood & affect appropriate.     Data Reviewed: I have personally reviewed following labs and imaging studies  CBC: Recent Labs  Lab 12/23/18 2008 12/24/18 0500 12/26/18 0511 12/27/18 0449 12/28/18 0441  WBC 19.8* 17.4* 13.4* 15.4* 13.0*  NEUTROABS 17.4*  --   --   --   --   HGB 15.2* 13.8 12.9 13.5 13.9  HCT 42.5 38.9 35.8* 35.8* 38.6  MCV 87.4 87.4 84.8 81.9 85.4  PLT 319 277 286 268 462   Basic Metabolic Panel: Recent Labs  Lab 12/23/18 2008 12/24/18 0500 12/25/18 0019 12/26/18 0511 12/27/18 0449 12/28/18 0441  NA 130* 130* 130* 130* 128* 127*  K 4.7 4.5 4.3 4.1 4.6 5.1  CL 86* 91* 89* 83* 82* 83*  CO2 13* 14* 14* 22 20* 18*  GLUCOSE 100* 84 125* 108* 76 109*  BUN 142* 140* 141* 145* 145* 163*  CREATININE 15.47* 13.80* 13.72* 13.42* 13.67* 14.07*  CALCIUM 6.9* 6.4* 6.2* 5.1* 5.1* 5.3*  MG 2.3  --   --   --   --   --   PHOS  --  11.7* 11.6* 11.1* 11.6* 11.8*   GFR: Estimated Creatinine Clearance: 3.4 mL/min (A) (by C-G formula based on SCr of 14.07 mg/dL (H)). Liver Function Tests: Recent Labs  Lab 12/23/18 2008 12/24/18 0500 12/25/18 0019 12/26/18 0511 12/27/18 0449 12/28/18 0441  AST 21 22  --   --   --   --   ALT 31 27  --   --   --   --   ALKPHOS 164* 157*  --   --   --   --   BILITOT 2.1* 1.8*  --   --   --   --   PROT 7.5 6.4*  --   --   --   --   ALBUMIN 3.0* 2.6* 2.5* 2.2* 2.2* 2.2*   No results for input(s): LIPASE, AMYLASE in the last 168 hours. No results for input(s): AMMONIA in the last 168 hours. Coagulation Profile: Recent Labs  Lab 12/24/18 0500 12/25/18 0557 12/26/18 0511 12/27/18 0449 12/28/18 0441  INR 1.3* 1.3* 1.3* 1.4* 1.5*   Cardiac Enzymes: Recent Labs  Lab 12/24/18 0500  CKTOTAL 57   BNP  (last 3 results) No results for input(s): PROBNP in the last 8760 hours. HbA1C: No results for input(s): HGBA1C in the last 72 hours. CBG: Recent Labs  Lab 12/28/18 0105 12/28/18 0348 12/28/18 0737 12/28/18 1154 12/28/18 1529  GLUCAP 113* 124* 103* 99 94   Lipid Profile: No results for input(s): CHOL, HDL, LDLCALC, TRIG, CHOLHDL, LDLDIRECT in the last 72 hours. Thyroid Function Tests: No results for input(s): TSH, T4TOTAL, FREET4, T3FREE, THYROIDAB in the last 72 hours. Anemia Panel: No results for input(s): VITAMINB12, FOLATE, FERRITIN, TIBC, IRON, RETICCTPCT in the last 72 hours. Sepsis Labs: Recent  Labs  Lab 12/23/18 2007 12/24/18 0007 12/24/18 0500  PROCALCITON  --   --  8.07  LATICACIDVEN 2.8* 1.7  --     Recent Results (from the past 240 hour(s))  GI pathogen panel by PCR, stool     Status: None   Collection Time: 12/23/18 10:28 PM   Specimen: STOOL  Result Value Ref Range Status   Plesiomonas shigelloides NOT DETECTED NOT DETECTED Final   Yersinia enterocolitica NOT DETECTED NOT DETECTED Final   Vibrio NOT DETECTED NOT DETECTED Final   Enteropathogenic E coli NOT DETECTED NOT DETECTED Final   E coli (ETEC) LT/ST NOT DETECTED NOT DETECTED Final   E coli 6962 by PCR Not applicable NOT DETECTED Final   Cryptosporidium by PCR NOT DETECTED NOT DETECTED Final   Entamoeba histolytica NOT DETECTED NOT DETECTED Final   Adenovirus F 40/41 NOT DETECTED NOT DETECTED Final   Norovirus GI/GII NOT DETECTED NOT DETECTED Final   Sapovirus NOT DETECTED NOT DETECTED Final    Comment: (NOTE) Performed At: [facility] [place], [place] [id number] Rush Farmer MD MW:[id number]    Vibrio cholerae NOT DETECTED NOT DETECTED Final   Campylobacter by PCR NOT DETECTED NOT DETECTED Final   Salmonella by PCR NOT DETECTED NOT DETECTED Final   E coli (STEC) NOT DETECTED NOT DETECTED Final   Enteroaggregative E coli NOT DETECTED NOT DETECTED Final   Shigella by  PCR NOT DETECTED NOT DETECTED Final   Cyclospora cayetanensis NOT DETECTED NOT DETECTED Final   Astrovirus NOT DETECTED NOT DETECTED Final   G lamblia by PCR NOT DETECTED NOT DETECTED Final   Rotavirus A by PCR NOT DETECTED NOT DETECTED Final  C Difficile Quick Screen w PCR reflex     Status: None   Collection Time: 12/23/18 10:28 PM   Specimen: STOOL  Result Value Ref Range Status   C Diff antigen NEGATIVE NEGATIVE Final   C Diff toxin NEGATIVE NEGATIVE Final   C Diff interpretation No C. difficile detected.  Final    Comment: Performed at [facility], [address] [place], [place] [address]  Urine culture     Status: Abnormal   Collection Time: 12/23/18 10:28 PM   Specimen: In/Out Cath Urine  Result Value Ref Range Status   Specimen Description   Final    IN/OUT CATH URINE Performed at [facility], [address] [place], [place] [address]    Special Requests   Final    NONE Performed at [facility], [address] [place], [place] [address]    Culture (A)  Final    7,000 COLONIES/mL ESCHERICHIA COLI 1,000 COLONIES/mL ENTEROCOCCUS FAECALIS    Report Status 12/27/2018 FINAL  Final   Organism ID, Bacteria ESCHERICHIA COLI (A)  Final   Organism ID, Bacteria ENTEROCOCCUS FAECALIS (A)  Final      Susceptibility   Escherichia coli - MIC*    AMPICILLIN >=32 RESISTANT Resistant     CEFAZOLIN <=4 SENSITIVE Sensitive     CEFTRIAXONE <=1 SENSITIVE Sensitive     CIPROFLOXACIN >=4 RESISTANT Resistant     GENTAMICIN <=1 SENSITIVE Sensitive     IMIPENEM <=0.25 SENSITIVE Sensitive     NITROFURANTOIN <=16 SENSITIVE Sensitive     TRIMETH/SULFA >=320 RESISTANT Resistant     AMPICILLIN/SULBACTAM 16 INTERMEDIATE Intermediate     PIP/TAZO <=4 SENSITIVE Sensitive     * 7,000 COLONIES/mL ESCHERICHIA COLI   Enterococcus faecalis - MIC*    AMPICILLIN <=2 SENSITIVE  Sensitive     NITROFURANTOIN <=16 SENSITIVE Sensitive     VANCOMYCIN 1 SENSITIVE Sensitive       * 1,000 COLONIES/mL ENTEROCOCCUS FAECALIS  Blood Culture (routine x 2)     Status: None   Collection Time: 12/23/18 10:29 PM   Specimen: BLOOD  Result Value Ref Range Status   Specimen Description BLOOD RIGHT ANTECUBITAL  Final   Special Requests   Final    BOTTLES DRAWN AEROBIC AND ANAEROBIC Blood Culture results may not be optimal due to an excessive volume of blood received in culture bottles   Culture   Final    NO GROWTH 5 DAYS Performed at [facility], [address] [place], [place] [address]    Report Status 12/28/2018 FINAL  Final  SARS CORONAVIRUS 2 (TAT 6-24 HRS) Nasopharyngeal Nasopharyngeal Swab     Status: None   Collection Time: 12/24/18 12:07 AM   Specimen: Nasopharyngeal Swab  Result Value Ref Range Status   SARS Coronavirus 2 NEGATIVE NEGATIVE Final    Comment: (NOTE) SARS-CoV-2 target nucleic acids are NOT DETECTED. The SARS-CoV-2 RNA is generally detectable in upper and lower respiratory specimens during the acute phase of infection. Negative results do not preclude SARS-CoV-2 infection, do not rule out co-infections with other pathogens, and should not be used as the sole basis for treatment or other patient management decisions. Negative results must be combined with clinical observations, patient history, and epidemiological information. The expected result is Negative. Fact Sheet for Patients: SugarRoll.be Fact Sheet for Healthcare Providers: https://www.woods-mathews.com/ This test is not yet approved or cleared by the [place] FDA and  has been authorized for detection and/or diagnosis of SARS-CoV-2 by FDA under an Emergency Use Authorization (EUA). This EUA will remain  in effect (meaning this test can be used) for the duration of the COVID-19 declaration under Section 56 4(b)(1) of the Act, 21 U.S.C. section 360bbb-3(b)(1), unless the authorization is terminated or revoked sooner. Performed  at [facility], [place] [address] [place], [place] [address]   Urine Culture     Status: None (Preliminary result)   Collection Time: 12/27/18  6:41 AM   Specimen: Urine, Random  Result Value Ref Range Status   Specimen Description   Final    URINE, RANDOM Performed at [facility], [address] [place], [place] [address]    Special Requests   Final    NONE Performed at [facility], [address] [place], [place] [address]    Culture   Final    CULTURE REINCUBATED FOR BETTER GROWTH Performed at [facility], [place] [address], [place], [place] [address]    Report Status PENDING  Incomplete         Radiology Studies: PERIPHERAL VASCULAR CATHETERIZATION  Result Date: 12/28/2018 See op note       Scheduled Meds: . calcium carbonate  1 tablet Oral TID WC  . Chlorhexidine Gluconate Cloth  6 each Topical Daily  . cholecalciferol  5,000 Units Oral Daily  . diphenhydrAMINE      . fentaNYL      . metoprolol tartrate  25 mg Oral BID  . midazolam      . sodium chloride flush  3 mL Intravenous Q12H  . warfarin  2.5 mg Oral ONCE-1800  . Warfarin - Pharmacist Dosing Inpatient   Does not apply q1800   Continuous Infusions: . dextrose 5 % and 0.9% NaCl 50 mL/hr at 12/28/18 1411  . sodium  chloride       LOS: 4 days    Time spent: 43mins    Kathie Dike, MD [facility]   If 7PM-7AM, please contact night-coverage [email or web address]  12/28/2018, 7:31 PM

## 2018-12-29 ENCOUNTER — Encounter: Payer: Self-pay | Admitting: Cardiology

## 2018-12-29 LAB — GLUCOSE, CAPILLARY
Glucose-Capillary: 119 mg/dL — ABNORMAL HIGH (ref 70–99)
Glucose-Capillary: 120 mg/dL — ABNORMAL HIGH (ref 70–99)
Glucose-Capillary: 127 mg/dL — ABNORMAL HIGH (ref 70–99)
Glucose-Capillary: 154 mg/dL — ABNORMAL HIGH (ref 70–99)
Glucose-Capillary: 216 mg/dL — ABNORMAL HIGH (ref 70–99)

## 2018-12-29 LAB — CBC
HCT: 39 % (ref 36.0–46.0)
Hemoglobin: 13.9 g/dL (ref 12.0–15.0)
MCH: 31.1 pg (ref 26.0–34.0)
MCHC: 35.6 g/dL (ref 30.0–36.0)
MCV: 87.2 fL (ref 80.0–100.0)
Platelets: 294 10*3/uL (ref 150–400)
RBC: 4.47 MIL/uL (ref 3.87–5.11)
RDW: 14.5 % (ref 11.5–15.5)
WBC: 11.9 10*3/uL — ABNORMAL HIGH (ref 4.0–10.5)
nRBC: 1.3 % — ABNORMAL HIGH (ref 0.0–0.2)

## 2018-12-29 LAB — PROTEIN ELECTRO, RANDOM URINE
Albumin ELP, Urine: UNDETERMINED %
Alpha-1-Globulin, U: UNDETERMINED %
Alpha-2-Globulin, U: UNDETERMINED %
Beta Globulin, U: UNDETERMINED %
Gamma Globulin, U: UNDETERMINED %
M Component, Ur: UNDETERMINED %
Total Protein, Urine: 3979.3 mg/dL

## 2018-12-29 LAB — RENAL FUNCTION PANEL
Albumin: 2.2 g/dL — ABNORMAL LOW (ref 3.5–5.0)
Anion gap: 23 — ABNORMAL HIGH (ref 5–15)
BUN: 122 mg/dL — ABNORMAL HIGH (ref 8–23)
CO2: 17 mmol/L — ABNORMAL LOW (ref 22–32)
Calcium: 5.6 mg/dL — CL (ref 8.9–10.3)
Chloride: 88 mmol/L — ABNORMAL LOW (ref 98–111)
Creatinine, Ser: 12.3 mg/dL — ABNORMAL HIGH (ref 0.44–1.00)
GFR calc Af Amer: 3 mL/min — ABNORMAL LOW (ref >60)
GFR calc non Af Amer: 3 mL/min — ABNORMAL LOW (ref >60)
Glucose, Bld: 137 mg/dL — ABNORMAL HIGH (ref 70–99)
Phosphorus: 10.9 mg/dL — ABNORMAL HIGH (ref 2.5–4.6)
Potassium: 6.1 mmol/L — ABNORMAL HIGH (ref 3.5–5.1)
Sodium: 128 mmol/L — ABNORMAL LOW (ref 135–145)

## 2018-12-29 LAB — PARATHYROID HORMONE, INTACT (NO CA): PTH: 517 pg/mL — ABNORMAL HIGH (ref 15–65)

## 2018-12-29 LAB — PROTIME-INR
INR: 2 — ABNORMAL HIGH (ref 0.8–1.2)
Prothrombin Time: 22.8 seconds — ABNORMAL HIGH (ref 11.4–15.2)

## 2018-12-29 MED ORDER — WARFARIN SODIUM 2.5 MG PO TABS
2.5000 mg | ORAL_TABLET | Freq: Once | ORAL | Status: AC
  Administered 2018-12-29: 09:00:00 2.5 mg via ORAL
  Filled 2018-12-29: qty 1

## 2018-12-29 MED ORDER — WARFARIN SODIUM 1 MG PO TABS: 1.0000 mg | ORAL_TABLET | Freq: Once | ORAL | Status: DC

## 2018-12-29 NOTE — Progress Notes (Signed)
HD Initiated   12/29/18 0939  Vital Signs  Temp 98.6 F (37 C)  Temp Source Axillary  Pulse Rate (!) 101  Pulse Rate Source Dinamap  Resp (!) 27  BP 116/85  BP Location Right Arm  BP Method Automatic  Patient Position (if appropriate) Lying  Oxygen Therapy  SpO2 98 %  O2 Device Room Air  Pain Assessment  Pain Scale 0-10  Pain Score 0  During Hemodialysis Assessment  Blood Flow Rate (mL/min) 200 mL/min  Arterial Pressure (mmHg) -80 mmHg  Venous Pressure (mmHg) 80 mmHg  Transmembrane Pressure (mmHg) 60 mmHg  Ultrafiltration Rate (mL/min) 200 mL/min  Dialysate Flow Rate (mL/min) 500 ml/min  Conductivity: Machine  14.2  HD Safety Checks Performed Yes  Dialysis Fluid Bolus Normal Saline  Bolus Amount (mL) 250 mL  Intra-Hemodialysis Comments Tx initiated

## 2018-12-29 NOTE — Consult Note (Addendum)
[facility] for Warfarin Indication: atrial fibrillation  Allergies  Allergen Reactions  . Influenza Vaccines   . Iodinated Diagnostic Agents     IVP Dye  . Metformin Hcl Nausea Only and Nausea And Vomiting  . Penicillins   . Sulfa Antibiotics     Patient Measurements: Height: 5' (152.4 cm) Weight: 200 lb (90.7 kg) IBW/kg (Calculated) : 45.5   Vital Signs: Temp: 98.6 F (37 C) (12/15 0736) Temp Source: Axillary (12/15 0654) BP: 131/90 (12/15 0736) Pulse Rate: 95 (12/15 0736)  Labs: Recent Labs    12/27/18 0449 12/28/18 0441 12/29/18 0800  HGB 13.5 13.9 13.9  HCT 35.8* 38.6 39.0  PLT 268 298 294  LABPROT 17.1* 17.7* 22.8*  INR 1.4* 1.5* 2.0*  CREATININE 13.67* 14.07* 12.30*    Estimated Creatinine Clearance: 3.8 mL/min (A) (by C-G formula based on SCr of 12.3 mg/dL (H)).   Medications:  Warfarin 1 mg daily- last dose 12/23/18  Assessment: Pharmacy has been consulted for warfarin in patient with a PMH significant for atrial fibrillation.   Date INR  Dose  12/9 1.3 ----- 12/10 1.3       2 mg 12/11   1.3 2 mg 12/12 1.3 2.5 mg 12/13 1.4 2.5mg  12/14 1.5 Dose not given- pt not alert enough per MAR? 12/15  2.0  Goal of Therapy:  INR 2-3 Monitor platelets by anticoagulation protocol: Yes   Plan:  Dose charted as not given last night d/t pt not being alert enough per note on MAR. Spoke with RN- pt alert and can take PO this am- will order 2.5 mg for this am 12/15. INR with am labs. CBC q72h per protocol.  *Addendum: the INR now came back at 2.0 this am 12/15 so it appears dose was possibly given last night? Patient has already received dose this am as noted above. F/u INR  Noralee Space, PharmD, BCPS Clinical Pharmacist 12/29/2018 9:20 AM

## 2018-12-29 NOTE — Progress Notes (Signed)
Pre HD    12/29/18 0930  Vital Signs  Temp 98.6 F (37 C)  Temp Source Axillary  Pulse Rate 92  Pulse Rate Source Dinamap  Resp (!) 26  BP (!) 118/91  BP Location Right Arm  BP Method Automatic  Patient Position (if appropriate) Lying  Oxygen Therapy  SpO2 96 %  O2 Device Room Air  Pain Assessment  Pain Scale 0-10  Pain Score 0  Dialysis Weight  Type of Weight Pre-Dialysis  Time-Out for Hemodialysis  What Procedure? HD   Pt Identifiers(min of two) First/Last Name;MRN/Account#;Pt's DOB(use if MRN/Acct# not available  Correct Site? Yes  Correct Side? Yes  Correct Procedure? Yes  Consents Verified? Yes  Rad Studies Available? N/A  Safety Precautions Reviewed? Yes  Engineer, civil (consulting) Number 1  Station Number 1  UF/Alarm Test Passed  Conductivity: Meter 14  Conductivity: Machine  14.2  pH 7.4  Reverse Osmosis main  Normal Saline Lot Number [id number]  Dialyzer Lot Number [id number]  Disposable Set Lot Number [id number]  Machine Temperature 98.6 F (37 C)  Musician and Audible Yes  Blood Lines Intact and Secured Yes  Pre Treatment Patient Checks  Vascular access used during treatment Catheter  HD catheter dressing before treatment WDL  Patient is receiving dialysis in a chair  (no)  Hepatitis B Surface Antigen Results Negative  Date Hepatitis B Surface Antigen Drawn 12/25/18  Isolation Initiated  (no)  Hepatitis B Surface Antibody  (<10)  Date Hepatitis B Surface Antibody Drawn 12/25/18  Hemodialysis Consent Verified Yes  Hemodialysis Standing Orders Initiated Yes  ECG (Telemetry) Monitor On Yes  Prime Ordered Normal Saline  Length of  DialysisTreatment -hour(s) 2.5 Hour(s)  Dialysis Treatment Comments  (Na 140)  Dialyzer Elisio 17H NR  Dialysate 3K;2.5 Ca  Dialysate Flow Ordered 500  Blood Flow Rate Ordered 250 mL/min  Ultrafiltration Goal 0 Liters  Dialysis Blood Pressure Support Ordered Normal Saline  Education / Care Plan  Dialysis Education  Provided Yes  Documented Education in Care Plan Yes  Outpatient Plan of Care Reviewed and on Chart Yes  Hemodialysis Catheter Left Internal jugular Double lumen Permanent (Tunneled)  Placement Date/Time: 12/28/18 1612   Time Out: Correct patient;Correct site;Correct procedure  Maximum sterile barrier precautions: Hand hygiene;Cap;Mask;Sterile gown;Sterile gloves;Large sterile sheet  Site Prep: Chlorhexidine (preferred)  Local Anes...  Site Condition No complications  Blue Lumen Status Blood return noted  Red Lumen Status Blood return noted  Purple Lumen Status N/A  Dressing Type Gauze/Drain sponge  Dressing Status Intact  Interventions New dressing

## 2018-12-29 NOTE — Progress Notes (Signed)
Pre HD Assessment   12/29/18 0931  Neurological  Level of Consciousness Alert  Orientation Level Oriented to person;Oriented to situation  Respiratory  Respiratory Pattern Regular;Unlabored  Chest Assessment Chest expansion symmetrical  Cardiac  Pulse Irregular  Heart Sounds S1, S2  ECG Monitor Yes  Cardiac Rhythm Atrial fibrillation  Vascular  R Radial Pulse +2  L Radial Pulse +2  Edema Generalized  Integumentary  Integumentary (WDL) X  Musculoskeletal  Musculoskeletal (WDL) X  Generalized Weakness Yes

## 2018-12-29 NOTE — Progress Notes (Signed)
HD Complete   12/29/18 1211  Vital Signs  Temp 97.6 F (36.4 C)  Temp Source Axillary  Pulse Rate (!) 104  Pulse Rate Source Dinamap  Resp 19  BP (!) 139/91  BP Location Right Arm  BP Method Automatic  Patient Position (if appropriate) Lying  During Hemodialysis Assessment  HD Safety Checks Performed Yes  KECN 34.3 KECN  Dialysis Fluid Bolus Normal Saline  Bolus Amount (mL) 250 mL  Intra-Hemodialysis Comments Tx completed

## 2018-12-29 NOTE — Progress Notes (Signed)
[place], [place] 12/29/18  Subjective:  Patient underwent first dialysis treatment yesterday. Tolerated well. Second dialysis treatment to be performed today.   Objective:  Vital signs in last 24 hours:  Temp:  [96.4 F (35.8 C)-98.6 F (37 C)] 98.6 F (37 C) (12/15 0939) Pulse Rate:  [25-142] 86 (12/15 1115) Resp:  [13-29] 16 (12/15 1115) BP: (111-151)/(76-111) 118/80 (12/15 1115) SpO2:  [91 %-99 %] 98 % (12/15 0939) Weight:  [93.7 kg] 93.7 kg (12/15 0930)  Weight change:  Filed Weights   12/23/18 1943 12/29/18 0930  Weight: 90.7 kg 93.7 kg    Intake/Output:    Intake/Output Summary (Last 24 hours) at 12/29/2018 1124 Last data filed at 12/28/2018 2120 Gross per 24 hour  Intake 1062.61 ml  Output 500 ml  Net 562.61 ml   Physical Exam: General:  Chronically ill-appearing, frail, laying in the bed  HEENT  moist oral mucous membranes  Lungs:  Room air, basilar rales, normal effort  Heart::  Irregular, no obvious rub  Abdomen:  Soft, nontender, previous surgical scar  Extremities:  1+ LE edmea  Neurologic:  Alert, able to follow commands  Skin:   Bruising on the left side of the neck noted.  Access:  L IJ permcath       Basic Metabolic Panel:  Recent Labs  Lab 12/23/18 2008 12/25/18 0019 12/26/18 0511 12/27/18 0449 12/28/18 0441 12/29/18 0800  NA 130* 130* 130* 128* 127* 128*  K 4.7 4.3 4.1 4.6 5.1 6.1*  CL 86* 89* 83* 82* 83* 88*  CO2 13* 14* 22 20* 18* 17*  GLUCOSE 100* 125* 108* 76 109* 137*  BUN 142* 141* 145* 145* 163* 122*  CREATININE 15.47* 13.72* 13.42* 13.67* 14.07* 12.30*  CALCIUM 6.9* 6.2* 5.1* 5.1* 5.3* 5.6*  MG 2.3  --   --   --   --   --   PHOS  --  11.6* 11.1* 11.6* 11.8* 10.9*     CBC: Recent Labs  Lab 12/23/18 2008 12/24/18 0500 12/26/18 0511 12/27/18 0449 12/28/18 0441 12/29/18 0800  WBC 19.8* 17.4* 13.4* 15.4* 13.0* 11.9*  NEUTROABS 17.4*  --   --   --   --   --   HGB 15.2* 13.8 12.9  13.5 13.9 13.9  HCT 42.5 38.9 35.8* 35.8* 38.6 39.0  MCV 87.4 87.4 84.8 81.9 85.4 87.2  PLT 319 277 286 268 298 294      Lab Results  Component Value Date   HEPBSAG NON REACTIVE 12/25/2018   HEPBSAB NON REACTIVE 12/25/2018      Microbiology:  Recent Results (from the past 240 hour(s))  GI pathogen panel by PCR, stool     Status: None   Collection Time: 12/23/18 10:28 PM   Specimen: STOOL  Result Value Ref Range Status   Plesiomonas shigelloides NOT DETECTED NOT DETECTED Final   Yersinia enterocolitica NOT DETECTED NOT DETECTED Final   Vibrio NOT DETECTED NOT DETECTED Final   Enteropathogenic E coli NOT DETECTED NOT DETECTED Final   E coli (ETEC) LT/ST NOT DETECTED NOT DETECTED Final   E coli 0093 by PCR Not applicable NOT DETECTED Final   Cryptosporidium by PCR NOT DETECTED NOT DETECTED Final   Entamoeba histolytica NOT DETECTED NOT DETECTED Final   Adenovirus F 40/41 NOT DETECTED NOT DETECTED Final   Norovirus GI/GII NOT DETECTED NOT DETECTED Final   Sapovirus NOT DETECTED NOT DETECTED Final    Comment: (NOTE) Performed At: [facility] [address]  [address] [place], [place] [id number] Rush Farmer MD UR:[id number]    Vibrio cholerae NOT DETECTED NOT DETECTED Final   Campylobacter by PCR NOT DETECTED NOT DETECTED Final   Salmonella by PCR NOT DETECTED NOT DETECTED Final   E coli (STEC) NOT DETECTED NOT DETECTED Final   Enteroaggregative E coli NOT DETECTED NOT DETECTED Final   Shigella by PCR NOT DETECTED NOT DETECTED Final   Cyclospora cayetanensis NOT DETECTED NOT DETECTED Final   Astrovirus NOT DETECTED NOT DETECTED Final   G lamblia by PCR NOT DETECTED NOT DETECTED Final   Rotavirus A by PCR NOT DETECTED NOT DETECTED Final  C Difficile Quick Screen w PCR reflex     Status: None   Collection Time: 12/23/18 10:28 PM   Specimen: STOOL  Result Value Ref Range Status   C Diff antigen NEGATIVE NEGATIVE Final   C Diff toxin NEGATIVE NEGATIVE Final   C Diff  interpretation No C. difficile detected.  Final    Comment: Performed at [facility], [address] [place], [place] [address]  Urine culture     Status: Abnormal   Collection Time: 12/23/18 10:28 PM   Specimen: In/Out Cath Urine  Result Value Ref Range Status   Specimen Description   Final    IN/OUT CATH URINE Performed at [facility], [address] [place], [place] [address]    Special Requests   Final    NONE Performed at [facility], [address] [place], [place] [address]    Culture (A)  Final    7,000 COLONIES/mL ESCHERICHIA COLI 1,000 COLONIES/mL ENTEROCOCCUS FAECALIS    Report Status 12/27/2018 FINAL  Final   Organism ID, Bacteria ESCHERICHIA COLI (A)  Final   Organism ID, Bacteria ENTEROCOCCUS FAECALIS (A)  Final      Susceptibility   Escherichia coli - MIC*    AMPICILLIN >=32 RESISTANT Resistant     CEFAZOLIN <=4 SENSITIVE Sensitive     CEFTRIAXONE <=1 SENSITIVE Sensitive     CIPROFLOXACIN >=4 RESISTANT Resistant     GENTAMICIN <=1 SENSITIVE Sensitive     IMIPENEM <=0.25 SENSITIVE Sensitive     NITROFURANTOIN <=16 SENSITIVE Sensitive     TRIMETH/SULFA >=320 RESISTANT Resistant     AMPICILLIN/SULBACTAM 16 INTERMEDIATE Intermediate     PIP/TAZO <=4 SENSITIVE Sensitive     * 7,000 COLONIES/mL ESCHERICHIA COLI   Enterococcus faecalis - MIC*    AMPICILLIN <=2 SENSITIVE Sensitive     NITROFURANTOIN <=16 SENSITIVE Sensitive     VANCOMYCIN 1 SENSITIVE Sensitive     * 1,000 COLONIES/mL ENTEROCOCCUS FAECALIS  Blood Culture (routine x 2)     Status: None   Collection Time: 12/23/18 10:29 PM   Specimen: BLOOD  Result Value Ref Range Status   Specimen Description BLOOD RIGHT ANTECUBITAL  Final   Special Requests   Final    BOTTLES DRAWN AEROBIC AND ANAEROBIC Blood Culture results may not be optimal due to an excessive volume of blood received in culture bottles   Culture   Final    NO GROWTH 5 DAYS Performed at [facility], [address] [place], [place] [address]    Report Status 12/28/2018 FINAL  Final  SARS CORONAVIRUS 2 (TAT 6-24 HRS) Nasopharyngeal Nasopharyngeal Swab     Status: None   Collection Time: 12/24/18 12:07 AM   Specimen: Nasopharyngeal Swab  Result Value Ref Range Status   SARS Coronavirus 2 NEGATIVE NEGATIVE Final    Comment: (NOTE) SARS-CoV-2 target nucleic  acids are NOT DETECTED. The SARS-CoV-2 RNA is generally detectable in upper and lower respiratory specimens during the acute phase of infection. Negative results do not preclude SARS-CoV-2 infection, do not rule out co-infections with other pathogens, and should not be used as the sole basis for treatment or other patient management decisions. Negative results must be combined with clinical observations, patient history, and epidemiological information. The expected result is Negative. Fact Sheet for Patients: SugarRoll.be Fact Sheet for Healthcare Providers: https://www.woods-mathews.com/ This test is not yet approved or cleared by the [place] FDA and  has been authorized for detection and/or diagnosis of SARS-CoV-2 by FDA under an Emergency Use Authorization (EUA). This EUA will remain  in effect (meaning this test can be used) for the duration of the COVID-19 declaration under Section 56 4(b)(1) of the Act, 21 U.S.C. section 360bbb-3(b)(1), unless the authorization is terminated or revoked sooner. Performed at [facility], [place] [address], [place], [place] [address]   Urine Culture     Status: None (Preliminary result)   Collection Time: 12/27/18  6:41 AM   Specimen: Urine, Random  Result Value Ref Range Status   Specimen Description   Final    URINE, RANDOM Performed at [facility], [address] [place], [place] [address]    Special Requests   Final    NONE Performed at [facility], [address] [place], [place] [address]     Culture   Final    CULTURE REINCUBATED FOR BETTER GROWTH Performed at [facility], [place] [address], [place], [place] [address]    Report Status PENDING  Incomplete    Coagulation Studies: Recent Labs    12/27/18 0449 12/28/18 0441 12/29/18 0800  LABPROT 17.1* 17.7* 22.8*  INR 1.4* 1.5* 2.0*    Urinalysis: Recent Labs    12/27/18 0641  COLORURINE BROWN*  LABSPEC 1.031*  PHURINE TEST NOT REPORTED DUE TO COLOR INTERFERENCE OF URINE PIGMENT  GLUCOSEU TEST NOT REPORTED DUE TO COLOR INTERFERENCE OF URINE PIGMENT*  HGBUR TEST NOT REPORTED DUE TO COLOR INTERFERENCE OF URINE PIGMENT*  BILIRUBINUR TEST NOT REPORTED DUE TO COLOR INTERFERENCE OF URINE PIGMENT*  KETONESUR TEST NOT REPORTED DUE TO COLOR INTERFERENCE OF URINE PIGMENT*  PROTEINUR TEST NOT REPORTED DUE TO COLOR INTERFERENCE OF URINE PIGMENT*  NITRITE TEST NOT REPORTED DUE TO COLOR INTERFERENCE OF URINE PIGMENT*  LEUKOCYTESUR TEST NOT REPORTED DUE TO COLOR INTERFERENCE OF URINE PIGMENT*      Imaging: PERIPHERAL VASCULAR CATHETERIZATION  Result Date: 12/28/2018 See op note    Medications:   . dextrose 5 % and 0.9% NaCl 50 mL/hr at 12/29/18 0447  . sodium chloride     . calcium carbonate  200 mg of elemental calcium Oral TID WC  . Chlorhexidine Gluconate Cloth  6 each Topical Daily  . cholecalciferol  5,000 Units Oral Daily  . metoprolol tartrate  25 mg Oral BID  . sodium chloride flush  3 mL Intravenous Q12H  . Warfarin - Pharmacist Dosing Inpatient   Does not apply q1800   acetaminophen, ALPRAZolam, HYDROmorphone (DILAUDID) injection, loperamide, metoprolol tartrate, ondansetron (ZOFRAN) IV, sodium chloride flush  Assessment/ Plan:  77 y.o. female with Diabetes, atrial fibrillation, hypertension, history of, atrial fibrillation with chronic anticoagulation, chronic left hydronephrosis , aortic atherosclerosis, was admitted on 12/23/2018 with Sepsis [facility])  Patient Active Problem List   Diagnosis Date  Noted  . Other cirrhosis of liver ([place]) 12/25/2018  . Hydronephrosis, left 12/25/2018  . AKI (  acute kidney injury) ([place]) 12/24/2018  . Colitis, acute 12/24/2018  . Elevated troponin 12/24/2018  . Essential hypertension 12/24/2018  . Atrial fibrillation, chronic ([place]) 12/24/2018  . Asplenia 05/19/2017  . Rectal cancer ([place]) 04/24/2016  . Panic disorder 07/03/2015  . Allergic rhinitis 03/23/2015  . Hypertension 07/04/2014  . Hypercholesteremia 07/04/2014  . Osteopenia 07/04/2014  . Vitamin D deficiency 07/04/2014  . Anxiety 07/04/2014  . CLL (chronic lymphocytic leukemia) ([place]) 07/04/2014  . Non-insulin dependent type 2 diabetes mellitus ([place]) 07/04/2014  . Diverticulitis 07/04/2014  . H/O malignant neoplasm of colon 07/04/2014  . Adiposity 07/04/2014  . Hernia of anterior abdominal wall 03/23/2013    #Acute kidney injury/hyperphosphatemia -Likely ATN from concurrent illness, possibly sepsis -Patient has chronic left hydronephrosis known at least since 2015, although patient reports no knowledge of this condition. Baseline creatinine of 0.89 from April 2020 -Patient has been evaluated by urology team. no intervention. -Screening serologies -ANA negative, ANCA negative, complements normal, kappa lambda light chain ratio normal  -Patient continues to have considerable renal dysfunction.  She will be due for another dialysis treatment today.  Orders have been prepared.  Third dialysis treatment for tomorrow.  #Cirrhosis of the liver -Continue to monitor clinically.  #Acidosis -Serum bicarbonate still a bit low at 17.  Should improve significantly after today's dialysis treatment.  #Diarrhea -C. difficile studies are negative -CT abdomen, noncontrast study reports focal areas of thickening involving cecum and ascending colon  #Non-insulin-dependent diabetes mellitus - Lab Results  Component Value Date   HGBA1C 6.1 (H) 12/24/2018   #Hyperkalemia. Potassium elevated at  6.1.  This should come down with dialysis treatment today as well as improvements in metabolic acidosis.  Continue to monitor potassium.    Results for IVIE, SAVITT (MRN [id number]) as of 12/27/2018 12:41  Ref. Range 12/24/2018 05:00  Cytoplasmic (C-ANCA) Latest Ref Range: Neg:<1:20 titer <1:20  P-ANCA Latest Ref Range: Neg:<1:20 titer <1:20  Atypical P-ANCA titer Latest Ref Range: Neg:<1:20 titer <1:20  C3 Complement Latest Ref Range: 82 - 167 mg/dL 124  Complement C4, Body Fluid Latest Ref Range: 12 - 38 mg/dL 28  Kappa free light chain Latest Ref Range: 3.3 - 19.4 mg/L 257.0 (H)  Lamda free light chains Latest Ref Range: 5.7 - 26.3 mg/L 162.0 (H)  Kappa, lamda light chain ratio Latest Ref Range: 0.26 - 1.65  1.59     LOS: 5 Semaj Coburn 12/15/202011:24 AM  [place], [place]  Note: This note was prepared with Dragon dictation. Any transcription errors are unintentional

## 2018-12-29 NOTE — Care Management Important Message (Signed)
Important Message  Patient Details  Name: Danielle Rowland MRN: [id number] Date of Birth: [birth year]   Medicare Important Message Given:  Yes     [name] 12/29/2018, 11:56 AM

## 2018-12-29 NOTE — Progress Notes (Signed)
Post HD Assessment   12/29/18 1216  Neurological  Level of Consciousness Alert  Orientation Level Oriented to person;Oriented to situation  Respiratory  Respiratory Pattern Regular;Unlabored  Chest Assessment Chest expansion symmetrical  Cardiac  Pulse Irregular  Heart Sounds S1, S2  ECG Monitor Yes  Cardiac Rhythm Atrial fibrillation  Vascular  R Radial Pulse +2  L Radial Pulse +2  Edema Generalized  Integumentary  Integumentary (WDL) X  Musculoskeletal  Musculoskeletal (WDL) X  Generalized Weakness Yes  Psychosocial  Psychosocial (WDL) X  Patient Behaviors Sad

## 2018-12-29 NOTE — Progress Notes (Signed)
Critical Ca of 5.6. MD notified.

## 2018-12-29 NOTE — Progress Notes (Signed)
Post HD    12/29/18 1215  Vital Signs  Temp 97.6 F (36.4 C)  Temp Source Axillary  Pulse Rate (!) 104  Pulse Rate Source Dinamap  Resp 19  BP (!) 146/99  BP Location Right Arm  BP Method Automatic  Patient Position (if appropriate) Lying  Oxygen Therapy  SpO2 98 %  O2 Device Room Air  Pain Assessment  Pain Scale 0-10  Pain Score 0  Post-Hemodialysis Assessment  Rinseback Volume (mL) 250 mL  KECN 34.3 V  Dialyzer Clearance Lightly streaked  Duration of HD Treatment -hour(s) 2.5 hour(s)  Hemodialysis Intake (mL) 500 mL  UF Total -Machine (mL) 501 mL  Net UF (mL) 1 mL  Tolerated HD Treatment Yes  AVG/AVF Arterial Site Held (minutes)  (n/a)  AVG/AVF Venous Site Held (minutes)  (n/a)  Hemodialysis Catheter Left Internal jugular Double lumen Permanent (Tunneled)  Placement Date/Time: 12/28/18 1612   Time Out: Correct patient;Correct site;Correct procedure  Maximum sterile barrier precautions: Hand hygiene;Cap;Mask;Sterile gown;Sterile gloves;Large sterile sheet  Site Prep: Chlorhexidine (preferred)  Local Anes...  Site Condition No complications  Blue Lumen Status Heparin locked  Red Lumen Status Heparin locked  Purple Lumen Status N/A  Catheter fill solution Heparin 1000 units/ml  Catheter fill volume (Arterial) 1.6 cc  Catheter fill volume (Venous) 1.6  Post treatment catheter status Capped and Clamped

## 2018-12-29 NOTE — Progress Notes (Addendum)
PROGRESS NOTE    Danielle Rowland  QIW:[id number] DOB: 1940-07-02 DOA: 12/23/2018 PCP: Birdie Sons, MD    Brief Narrative:  78 y/o female with history of diabetes, A fib on coumadin, HTN and CLL, presents with 4 day history of diarrhea and vomiting. She was noted to be in acute renal failure with creatinine of 13, metabolic acidosis and elevated wbc count. CT abdomen showed evidence of colitis. C diff and GI pathogen panel was found to be negative. Nephrology following for renal failure.  With IV fluids, her renal function did not improve.  She was ultimately started on dialysis on 12/14.   Assessment & Plan:   Active Problems:   CLL (chronic lymphocytic leukemia) (HCC)   Non-insulin dependent type 2 diabetes mellitus (HCC)   AKI (acute kidney injury) ([place])   Colitis, acute   Elevated troponin   Essential hypertension   Atrial fibrillation, chronic (HCC)   Other cirrhosis of liver (HCC)   Hydronephrosis, left   1. Acute colitis.  Sepsis on admission ruled out. Continue IV hydration.  Stool for C. difficile and GI pathogen panel negative.  Blood culture has shown no growth. Overall diarrhea improving.  She is afebrile at this time.  Lactic acidosis is improving with IV hydration. 2. Acute kidney injury.  Likely related to dehydration from diarrhea/colitis.  Nephrology consulted.  Renal ultrasound showed left-sided hydronephrosis, but this was a chronic finding.  Seen by urology and stent placement was not felt to be indicated. CK levels noted to be normal.  She was treated with IV fluids but did not have any significant urine output.  PermCath was placed on 12/14 and she underwent her first dialysis.  Anticipate second dialysis session today. 3. Metabolic acidosis.  Suspect is related to GI losses/dehydration/renal failure.  She was treated with bicarbonate infusion which improved serum bicarb.  Anticipate further improvement with dialysis. 4. Elevated troponin.  Mild.  Suspect this  is demand ischemia in the setting of acute illness. 5. Type 2 diabetes, non-insulin-dependent, hold oral agents.  She was having episodes of hypoglycemia.  Continue on dextrose in maintenance fluid.  A1c of 6.1. 6. Chronic atrial fibrillation.  Presented with rapid ventricular response in the setting of dehydration and acute illness.  She is anticoagulated with Coumadin.    Continue rate control with metoprolol 7. Chronic lymphocytic leukemia.  Follow-up with oncology as an outpatient. 8. Cirrhosis.  Unclear etiology.  Viral hepatitis markers are negative. 9. Hypocalcemia.  Calcium noted to be 5.3 this morning.  When corrected for albumin, it is still low at 7.1.  Ionized calcium has been less than 3.  Patient does not appear to be having any symptoms.  Will continue to supplement with p.o. calcium for now since her phosphorus is significantly elevated 10. Hyperkalemia.  This should improve with dialysis.   DVT prophylaxis: coumadin Code Status: DNR Family Communication: discussed with sister over the phone on 12/12 Disposition Plan: pending hospital course   Consultants:   Nephrology  Urology  Vascular surgery  Procedures:   Permacath placement on 12/14  Antimicrobials:       Subjective: Denies any shortness of breath.  Feels that diarrhea is better.  No vomiting.  No chest pain.  Objective: Vitals:   12/29/18 1211 12/29/18 1215 12/29/18 1313 12/29/18 1630  BP: (!) 139/91 (!) 146/99 (!) 135/98 (!) 142/91  Pulse: (!) 104 (!) 104 (!) 105 (!) 107  Resp: 19 19 16 18   Temp: 97.6 F (36.4 C) 97.6  F (36.4 C)  98.6 F (37 C)  TempSrc: Axillary Axillary    SpO2:  98% 98% 95%  Weight:      Height:        Intake/Output Summary (Last 24 hours) at 12/29/2018 2025 Last data filed at 12/29/2018 1215 Gross per 24 hour  Intake --  Output 501 ml  Net -501 ml   Filed Weights   12/23/18 1943 12/29/18 0930  Weight: 90.7 kg 93.7 kg    Examination:  General exam: Alert,  awake, oriented x 3 Respiratory system: Diminished breath sounds bilaterally. Respiratory effort normal. Cardiovascular system:RRR. No murmurs, rubs, gallops. Gastrointestinal system: Abdomen is nondistended, soft and nontender. No organomegaly or masses felt. Normal bowel sounds heard. Central nervous system: Alert and oriented. No focal neurological deficits. Extremities: 1+ edema bilaterally Skin: No rashes, lesions or ulcers Psychiatry: Judgement and insight appear normal. Mood & affect appropriate.    Data Reviewed: I have personally reviewed following labs and imaging studies  CBC: Recent Labs  Lab 12/23/18 2008 12/24/18 0500 12/26/18 0511 12/27/18 0449 12/28/18 0441 12/29/18 0800  WBC 19.8* 17.4* 13.4* 15.4* 13.0* 11.9*  NEUTROABS 17.4*  --   --   --   --   --   HGB 15.2* 13.8 12.9 13.5 13.9 13.9  HCT 42.5 38.9 35.8* 35.8* 38.6 39.0  MCV 87.4 87.4 84.8 81.9 85.4 87.2  PLT 319 277 286 268 298 401   Basic Metabolic Panel: Recent Labs  Lab 12/23/18 2008 12/25/18 0019 12/26/18 0511 12/27/18 0449 12/28/18 0441 12/29/18 0800  NA 130* 130* 130* 128* 127* 128*  K 4.7 4.3 4.1 4.6 5.1 6.1*  CL 86* 89* 83* 82* 83* 88*  CO2 13* 14* 22 20* 18* 17*  GLUCOSE 100* 125* 108* 76 109* 137*  BUN 142* 141* 145* 145* 163* 122*  CREATININE 15.47* 13.72* 13.42* 13.67* 14.07* 12.30*  CALCIUM 6.9* 6.2* 5.1* 5.1* 5.3* 5.6*  MG 2.3  --   --   --   --   --   PHOS  --  11.6* 11.1* 11.6* 11.8* 10.9*   GFR: Estimated Creatinine Clearance: 3.9 mL/min (A) (by C-G formula based on SCr of 12.3 mg/dL (H)). Liver Function Tests: Recent Labs  Lab 12/23/18 2008 12/24/18 0500 12/25/18 0019 12/26/18 0511 12/27/18 0449 12/28/18 0441 12/29/18 0800  AST 21 22  --   --   --   --   --   ALT 31 27  --   --   --   --   --   ALKPHOS 164* 157*  --   --   --   --   --   BILITOT 2.1* 1.8*  --   --   --   --   --   PROT 7.5 6.4*  --   --   --   --   --   ALBUMIN 3.0* 2.6* 2.5* 2.2* 2.2* 2.2* 2.2*     No results for input(s): LIPASE, AMYLASE in the last 168 hours. No results for input(s): AMMONIA in the last 168 hours. Coagulation Profile: Recent Labs  Lab 12/25/18 0557 12/26/18 0511 12/27/18 0449 12/28/18 0441 12/29/18 0800  INR 1.3* 1.3* 1.4* 1.5* 2.0*   Cardiac Enzymes: Recent Labs  Lab 12/24/18 0500  CKTOTAL 57   BNP (last 3 results) No results for input(s): PROBNP in the last 8760 hours. HbA1C: No results for input(s): HGBA1C in the last 72 hours. CBG: Recent Labs  Lab 12/28/18 2231 12/29/18 0040 12/29/18  3790 12/29/18 0732 12/29/18 1626  GLUCAP 100* 120* 127* 119* 154*   Lipid Profile: No results for input(s): CHOL, HDL, LDLCALC, TRIG, CHOLHDL, LDLDIRECT in the last 72 hours. Thyroid Function Tests: No results for input(s): TSH, T4TOTAL, FREET4, T3FREE, THYROIDAB in the last 72 hours. Anemia Panel: No results for input(s): VITAMINB12, FOLATE, FERRITIN, TIBC, IRON, RETICCTPCT in the last 72 hours. Sepsis Labs: Recent Labs  Lab 12/23/18 2007 12/24/18 0007 12/24/18 0500  PROCALCITON  --   --  8.07  LATICACIDVEN 2.8* 1.7  --     Recent Results (from the past 240 hour(s))  GI pathogen panel by PCR, stool     Status: None   Collection Time: 12/23/18 10:28 PM   Specimen: STOOL  Result Value Ref Range Status   Plesiomonas shigelloides NOT DETECTED NOT DETECTED Final   Yersinia enterocolitica NOT DETECTED NOT DETECTED Final   Vibrio NOT DETECTED NOT DETECTED Final   Enteropathogenic E coli NOT DETECTED NOT DETECTED Final   E coli (ETEC) LT/ST NOT DETECTED NOT DETECTED Final   E coli 2409 by PCR Not applicable NOT DETECTED Final   Cryptosporidium by PCR NOT DETECTED NOT DETECTED Final   Entamoeba histolytica NOT DETECTED NOT DETECTED Final   Adenovirus F 40/41 NOT DETECTED NOT DETECTED Final   Norovirus GI/GII NOT DETECTED NOT DETECTED Final   Sapovirus NOT DETECTED NOT DETECTED Final    Comment: (NOTE) Performed At: [facility] [place], [place] [id number] [place] MD QA:[id number]    Vibrio cholerae NOT DETECTED NOT DETECTED Final   Campylobacter by PCR NOT DETECTED NOT DETECTED Final   Salmonella by PCR NOT DETECTED NOT DETECTED Final   E coli (STEC) NOT DETECTED NOT DETECTED Final   Enteroaggregative E coli NOT DETECTED NOT DETECTED Final   Shigella by PCR NOT DETECTED NOT DETECTED Final   Cyclospora cayetanensis NOT DETECTED NOT DETECTED Final   Astrovirus NOT DETECTED NOT DETECTED Final   G lamblia by PCR NOT DETECTED NOT DETECTED Final   Rotavirus A by PCR NOT DETECTED NOT DETECTED Final  C Difficile Quick Screen w PCR reflex     Status: None   Collection Time: 12/23/18 10:28 PM   Specimen: STOOL  Result Value Ref Range Status   C Diff antigen NEGATIVE NEGATIVE Final   C Diff toxin NEGATIVE NEGATIVE Final   C Diff interpretation No C. difficile detected.  Final    Comment: Performed at [facility], [address] [place], [place] [address]  Urine culture     Status: Abnormal   Collection Time: 12/23/18 10:28 PM   Specimen: In/Out Cath Urine  Result Value Ref Range Status   Specimen Description   Final    IN/OUT CATH URINE Performed at [facility], [address], [place], [place] [address]    Special Requests   Final    NONE Performed at [facility], [address] [place], [place] [address]    Culture (A)  Final    7,000 COLONIES/mL ESCHERICHIA COLI 1,000 COLONIES/mL ENTEROCOCCUS FAECALIS    Report Status 12/27/2018 FINAL  Final   Organism ID, Bacteria ESCHERICHIA COLI (A)  Final   Organism ID, Bacteria ENTEROCOCCUS FAECALIS (A)  Final      Susceptibility   Escherichia coli - MIC*    AMPICILLIN >=32 RESISTANT Resistant     CEFAZOLIN <=4 SENSITIVE Sensitive     CEFTRIAXONE <=1 SENSITIVE Sensitive     CIPROFLOXACIN >=4 RESISTANT Resistant  GENTAMICIN <=1 SENSITIVE Sensitive     IMIPENEM <=0.25 SENSITIVE Sensitive     NITROFURANTOIN <=16  SENSITIVE Sensitive     TRIMETH/SULFA >=320 RESISTANT Resistant     AMPICILLIN/SULBACTAM 16 INTERMEDIATE Intermediate     PIP/TAZO <=4 SENSITIVE Sensitive     * 7,000 COLONIES/mL ESCHERICHIA COLI   Enterococcus faecalis - MIC*    AMPICILLIN <=2 SENSITIVE Sensitive     NITROFURANTOIN <=16 SENSITIVE Sensitive     VANCOMYCIN 1 SENSITIVE Sensitive     * 1,000 COLONIES/mL ENTEROCOCCUS FAECALIS  Blood Culture (routine x 2)     Status: None   Collection Time: 12/23/18 10:29 PM   Specimen: BLOOD  Result Value Ref Range Status   Specimen Description BLOOD RIGHT ANTECUBITAL  Final   Special Requests   Final    BOTTLES DRAWN AEROBIC AND ANAEROBIC Blood Culture results may not be optimal due to an excessive volume of blood received in culture bottles   Culture   Final    NO GROWTH 5 DAYS Performed at [facility], [address] [place], [place] [address]    Report Status 12/28/2018 FINAL  Final  SARS CORONAVIRUS 2 (TAT 6-24 HRS) Nasopharyngeal Nasopharyngeal Swab     Status: None   Collection Time: 12/24/18 12:07 AM   Specimen: Nasopharyngeal Swab  Result Value Ref Range Status   SARS Coronavirus 2 NEGATIVE NEGATIVE Final    Comment: (NOTE) SARS-CoV-2 target nucleic acids are NOT DETECTED. The SARS-CoV-2 RNA is generally detectable in upper and lower respiratory specimens during the acute phase of infection. Negative results do not preclude SARS-CoV-2 infection, do not rule out co-infections with other pathogens, and should not be used as the sole basis for treatment or other patient management decisions. Negative results must be combined with clinical observations, patient history, and epidemiological information. The expected result is Negative. Fact Sheet for Patients: SugarRoll.be Fact Sheet for Healthcare Providers: https://www.woods-mathews.com/ This test is not yet approved or cleared by the [place] FDA and  has been  authorized for detection and/or diagnosis of SARS-CoV-2 by FDA under an Emergency Use Authorization (EUA). This EUA will remain  in effect (meaning this test can be used) for the duration of the COVID-19 declaration under Section 56 4(b)(1) of the Act, 21 U.S.C. section 360bbb-3(b)(1), unless the authorization is terminated or revoked sooner. Performed at [facility], [place] [address] [place], [place] [address]   Urine Culture     Status: Abnormal (Preliminary result)   Collection Time: 12/27/18  6:41 AM   Specimen: Urine, Random  Result Value Ref Range Status   Specimen Description   Final    URINE, RANDOM Performed at [facility], [address], [place], [place] [address]    Special Requests   Final    NONE Performed at [facility], [address], [place], [place] [address]    Culture (A)  Final    80,000 COLONIES/mL ENTEROCOCCUS FAECALIS SUSCEPTIBILITIES TO FOLLOW Performed at [facility], [place] [address], [place], [place] [address]    Report Status PENDING  Incomplete         Radiology Studies: PERIPHERAL VASCULAR CATHETERIZATION  Result Date: 12/28/2018 See op note       Scheduled Meds: . calcium carbonate  200 mg of elemental calcium Oral TID WC  . Chlorhexidine Gluconate Cloth  6 each Topical Daily  . cholecalciferol  5,000 Units Oral Daily  . metoprolol tartrate  25 mg Oral BID  .  sodium chloride flush  3 mL Intravenous Q12H  . Warfarin - Pharmacist Dosing Inpatient   Does not apply q1800   Continuous Infusions: . dextrose 5 % and 0.9% NaCl 50 mL/hr at 12/29/18 0447  . sodium chloride       LOS: 5 days    Time spent: 57mins    Kathie Dike, MD [facility]   If 7PM-7AM, please contact night-coverage [email or web address]  12/29/2018, 8:25 PM

## 2018-12-30 ENCOUNTER — Inpatient Hospital Stay: Payer: Medicare Other | Attending: Internal Medicine

## 2018-12-30 LAB — GLUCOSE, CAPILLARY
Glucose-Capillary: 106 mg/dL — ABNORMAL HIGH (ref 70–99)
Glucose-Capillary: 139 mg/dL — ABNORMAL HIGH (ref 70–99)
Glucose-Capillary: 174 mg/dL — ABNORMAL HIGH (ref 70–99)
Glucose-Capillary: 193 mg/dL — ABNORMAL HIGH (ref 70–99)

## 2018-12-30 LAB — URINE CULTURE: Culture: 80000 — AB

## 2018-12-30 LAB — RENAL FUNCTION PANEL
Albumin: 2.2 g/dL — ABNORMAL LOW (ref 3.5–5.0)
Anion gap: 20 — ABNORMAL HIGH (ref 5–15)
BUN: 86 mg/dL — ABNORMAL HIGH (ref 8–23)
CO2: 21 mmol/L — ABNORMAL LOW (ref 22–32)
Calcium: 5.9 mg/dL — CL (ref 8.9–10.3)
Chloride: 92 mmol/L — ABNORMAL LOW (ref 98–111)
Creatinine, Ser: 9.6 mg/dL — ABNORMAL HIGH (ref 0.44–1.00)
GFR calc Af Amer: 4 mL/min — ABNORMAL LOW (ref >60)
GFR calc non Af Amer: 4 mL/min — ABNORMAL LOW (ref >60)
Glucose, Bld: 185 mg/dL — ABNORMAL HIGH (ref 70–99)
Phosphorus: 8.2 mg/dL — ABNORMAL HIGH (ref 2.5–4.6)
Potassium: 4.8 mmol/L (ref 3.5–5.1)
Sodium: 133 mmol/L — ABNORMAL LOW (ref 135–145)

## 2018-12-30 LAB — PROTIME-INR
INR: 2.2 — ABNORMAL HIGH (ref 0.8–1.2)
Prothrombin Time: 24.7 seconds — ABNORMAL HIGH (ref 11.4–15.2)

## 2018-12-30 LAB — CBC
HCT: 37.5 % (ref 36.0–46.0)
Hemoglobin: 13.8 g/dL (ref 12.0–15.0)
MCH: 30.9 pg (ref 26.0–34.0)
MCHC: 36.8 g/dL — ABNORMAL HIGH (ref 30.0–36.0)
MCV: 83.9 fL (ref 80.0–100.0)
Platelets: 270 10*3/uL (ref 150–400)
RBC: 4.47 MIL/uL (ref 3.87–5.11)
RDW: 14.7 % (ref 11.5–15.5)
WBC: 14.1 10*3/uL — ABNORMAL HIGH (ref 4.0–10.5)
nRBC: 1.5 % — ABNORMAL HIGH (ref 0.0–0.2)

## 2018-12-30 LAB — HEMOGLOBIN A1C
Hgb A1c MFr Bld: 6.1 % — ABNORMAL HIGH (ref 4.8–5.6)
Mean Plasma Glucose: 128.37 mg/dL

## 2018-12-30 MED ORDER — INSULIN ASPART 100 UNIT/ML ~~LOC~~ SOLN
0.0000 [IU] | Freq: Three times a day (TID) | SUBCUTANEOUS | Status: DC
  Administered 2018-12-30: 1 [IU] via SUBCUTANEOUS
  Filled 2018-12-30: qty 1

## 2018-12-30 MED ORDER — WARFARIN SODIUM 2 MG PO TABS
2.0000 mg | ORAL_TABLET | Freq: Once | ORAL | Status: AC
  Administered 2018-12-30: 2 mg via ORAL
  Filled 2018-12-30: qty 1

## 2018-12-30 MED ORDER — DOCUSATE SODIUM 100 MG PO CAPS
100.0000 mg | ORAL_CAPSULE | Freq: Two times a day (BID) | ORAL | Status: DC
  Administered 2018-12-30 – 2019-01-01 (×4): 100 mg via ORAL
  Filled 2018-12-30 (×4): qty 1

## 2018-12-30 MED ORDER — INSULIN ASPART 100 UNIT/ML ~~LOC~~ SOLN: 0.0000 [IU] | Freq: Every day | SUBCUTANEOUS | Status: DC

## 2018-12-30 MED ORDER — ALPRAZOLAM 0.25 MG PO TABS
0.2500 mg | ORAL_TABLET | Freq: Three times a day (TID) | ORAL | Status: DC | PRN
  Administered 2018-12-30 – 2019-01-01 (×5): 0.25 mg via ORAL
  Filled 2018-12-30 (×5): qty 1

## 2018-12-30 NOTE — Progress Notes (Addendum)
[place] [facility] [place] NAME: Danielle Rowland    MR#:  [id number]  DATE OF BIRTH:  06-26-1940  SUBJECTIVE:  overall improving. Patient reports she wants to go home. She hasn't gotten out of bed for last six days. Motivated to do physical therapy. Denies any pain.  REVIEW OF SYSTEMS:   Review of Systems  Constitutional: Negative for chills, fever and weight loss.  HENT: Negative for ear discharge, ear pain and nosebleeds.   Eyes: Negative for blurred vision, pain and discharge.  Respiratory: Negative for sputum production, shortness of breath, wheezing and stridor.   Cardiovascular: Negative for chest pain, palpitations, orthopnea and PND.  Gastrointestinal: Negative for abdominal pain, diarrhea, nausea and vomiting.  Genitourinary: Negative for frequency and urgency.  Musculoskeletal: Negative for back pain and joint pain.  Neurological: Negative for sensory change, speech change, focal weakness and weakness.  Psychiatric/Behavioral: Negative for depression and hallucinations. The patient is not nervous/anxious.    Tolerating Diet: renal diet Tolerating PT: pending  DRUG ALLERGIES:   Allergies  Allergen Reactions  . Influenza Vaccines   . Iodinated Diagnostic Agents     IVP Dye  . Metformin Hcl Nausea Only and Nausea And Vomiting  . Penicillins   . Sulfa Antibiotics     VITALS:  Blood pressure (!) 120/92, pulse 84, temperature 97.7 F (36.5 C), temperature source Oral, resp. rate 17, height 5' (1.524 m), weight 93.7 kg, SpO2 94 %.  PHYSICAL EXAMINATION:   Physical Exam  GENERAL:  78 y.o.-year-old patient lying in the bed with no acute distress. Obese EYES: Pupils equal, round, reactive to light and accommodation. No scleral icterus. Extraocular muscles intact.  HEENT: Head atraumatic, normocephalic. Oropharynx and nasopharynx clear.  NECK:  Supple, no jugular venous distention. No thyroid enlargement, no tenderness.  Left IJ  HD catheter LUNGS: Normal breath sounds bilaterally, no wheezing, rales, rhonchi. No use of accessory muscles of respiration.  CARDIOVASCULAR: S1, S2 normal. No murmurs, rubs, or gallops.  ABDOMEN: Soft, nontender, nondistended. Bowel sounds present. No organomegaly or mass.  EXTREMITIES: No cyanosis, clubbing  +edema b/l.    NEUROLOGIC: Cranial nerves II through XII are intact. No focal Motor or sensory deficits b/l.   PSYCHIATRIC:  patient is alert and oriented x 3.  SKIN: No obvious rash, lesion, or ulcer.   LABORATORY PANEL:  CBC Recent Labs  Lab 12/30/18 0418  WBC 14.1*  HGB 13.8  HCT 37.5  PLT 270    Chemistries  Recent Labs  Lab 12/23/18 2008 12/24/18 0500 12/30/18 0418  NA 130* 130* 133*  K 4.7 4.5 4.8  CL 86* 91* 92*  CO2 13* 14* 21*  GLUCOSE 100* 84 185*  BUN 142* 140* 86*  CREATININE 15.47* 13.80* 9.60*  CALCIUM 6.9* 6.4* 5.9*  MG 2.3  --   --   AST 21 22  --   ALT 31 27  --   ALKPHOS 164* 157*  --   BILITOT 2.1* 1.8*  --    Cardiac Enzymes No results for input(s): TROPONINI in the last 168 hours. RADIOLOGY:  PERIPHERAL VASCULAR CATHETERIZATION  Result Date: 12/28/2018 See op note  ASSESSMENT AND PLAN:  78 y/o female with history of diabetes, A fib on coumadin, HTN and CLL, presents with 4 day history of diarrhea and vomiting. She was noted to be in acute renal failure with creatinine of 13, metabolic acidosis and elevated wbc count. CT abdomen showed evidence of colitis. C diff and  GI pathogen panel was found to be negative.  1. Acute gastroenteritis--improved -Sepsis on admission ruled out.  -received  IV hydration.  -Stool for C. difficile and GI pathogen panel negative.  -Blood culture has shown no growth. Overall diarrhea improving. She is afebrile at this time. -Lactic acidosis is improving with IV hydration.  2. Acute kidney injury/ATN with volume loss at [place]. Likely related to dehydration from diarrhea/colitis.  -Nephrology  consulted-- started on hemodialysis. -Renal ultrasound showed left-sided hydronephrosis, but this was a chronic finding. Seen by urology and stent placement was not felt to be indicated. CK levels noted to be normal. - PermCath was placed on 12/14 and she underwent her dialysis. No UOP documented -patient likely will need dialysis as outpatient-- she worker consulted  3. Acute Metabolic acidosis. Suspect is related to GI losses/dehydration/renal failure.  She was treated with bicarbonate infusion which improved serum bicarb.  Anticipate further improvement with dialysis.  4. Elevated troponin. Mild. Suspect this is demand ischemia in the setting of acute illness.  5. Type 2 diabetes, non-insulin-dependent, hold oral agents.  She was having episodes of hypoglycemia.  Continue on dextrose in maintenance fluid.  A1c of 6.1. -Sugars stable. DC dextrose drip  6. Chronic atrial fibrillation. Presented with rapid ventricular response in the setting of dehydration and acute illness.  -She is anticoagulated with Coumadin.    -Continue rate control with metoprolol. Heart rate stable  7. Chronic lymphocytic leukemia. Follow-up with oncology as an outpatient.  8. Generalized weakness with deconditioning -says she ambulates at home without any help. She hasn't gotten out of bed for last six days. PT consultation placed.  9. Hypocalcemia.  Calcium noted to be 5.3 this morning.  When corrected for albumin, it is still low at 7.1.  Ionized calcium has been less than 3.  Patient does not appear to be having any symptoms.  Will continue to supplement with p.o. calcium for now since her phosphorus is significantly elevated  10. Hyperkalemia.  This should improve with dialysis. Potassium is 4.8  Procedures: HD catheter placement Family communication : sister Diane's voicemail is full Consults : vascular nephrology  discharge Disposition : TBD CODE STATUS: DNR prior to admission DVT Prophylaxis  :warfarin  TOTAL TIME TAKING CARE OF THIS PATIENT: **30* minutes.  >50% time spent on counselling and coordination of care  POSSIBLE D/C IN **few* DAYS, DEPENDING ON CLINICAL CONDITION.  Note: This dictation was prepared with Dragon dictation along with smaller phrase technology. Any transcriptional errors that result from this process are unintentional.  Fritzi Mandes M.D on 12/30/2018 at 8:07 AM  Between 7am to 6pm - Pager - [phone]  After 6pm go to [email or web address]  [facility]   CC: Primary care physician; Birdie Sons, MDPatient ID: Quinn Plowman, female   DOB: [birth year], 78 y.o.   MRN: [id number]

## 2018-12-30 NOTE — Progress Notes (Signed)
Pre HD Tx    12/30/18 1150  Vital Signs  Temp 97.6 F (36.4 C)  Temp Source Oral  Pulse Rate 82  Pulse Rate Source Monitor  Resp 18  BP 129/84  BP Location Right Arm  BP Method Automatic  Patient Position (if appropriate) Sitting  Oxygen Therapy  SpO2 94 %  O2 Device Room Air  Pulse Oximetry Type Continuous  Pain Assessment  Pain Scale 0-10  Pain Score 0  Dialysis Weight  Weight 93.7 kg  Type of Weight Pre-Dialysis  Time-Out for Hemodialysis  What Procedure? HD   Pt Identifiers(min of two) First/Last Name;MRN/Account#  Correct Site? Yes  Correct Side? Yes  Correct Procedure? Yes  Consents Verified? Yes  Rad Studies Available? N/A  Safety Precautions Reviewed? Yes  Education / Care Plan  Dialysis Education Provided Yes  Documented Education in Care Plan Yes  Hemodialysis Catheter Left Internal jugular Double lumen Permanent (Tunneled)  Placement Date/Time: 12/28/18 1612   Time Out: Correct patient;Correct site;Correct procedure  Maximum sterile barrier precautions: Hand hygiene;Cap;Mask;Sterile gown;Sterile gloves;Large sterile sheet  Site Prep: Chlorhexidine (preferred)  Local Anes...  Site Condition No complications  Blue Lumen Status Blood return noted  Red Lumen Status Blood return noted  Purple Lumen Status N/A  Dressing Type Biopatch;Occlusive  Dressing Status Clean;Dry;Intact

## 2018-12-30 NOTE — Progress Notes (Signed)
HD Tx Completed    12/30/18 1510  Vital Signs  Pulse Rate (!) 105  Resp 17  BP 99/71  Oxygen Therapy  SpO2 94 %  O2 Device Room Air  During Hemodialysis Assessment  Blood Flow Rate (mL/min) 300 mL/min  Arterial Pressure (mmHg) -180 mmHg  Venous Pressure (mmHg) 150 mmHg  Transmembrane Pressure (mmHg) 50 mmHg  Ultrafiltration Rate (mL/min) 210 mL/min  Dialysate Flow Rate (mL/min) 600 ml/min  Conductivity: Machine  14  HD Safety Checks Performed Yes  Intra-Hemodialysis Comments Tx completed

## 2018-12-30 NOTE — Progress Notes (Signed)
Pre HD Assessment    12/30/18 1150  Neurological  Level of Consciousness Responds to Pain  Orientation Level Oriented to person;Oriented to place;Disoriented to time;Disoriented to situation  Respiratory  Respiratory Pattern Regular;Unlabored  Chest Assessment Chest expansion symmetrical  Cough None  Cardiac  Pulse Irregular  Heart Sounds S1, S2  ECG Monitor Yes  Cardiac Rhythm Atrial fibrillation  Vascular  R Radial Pulse +2  L Radial Pulse +2  Edema Generalized  Psychosocial  Psychosocial (WDL) WDL  Patient Behaviors Calm;Cooperative

## 2018-12-30 NOTE — Progress Notes (Signed)
HD Tx started w/o complication    78/24/23 1155  Vital Signs  Pulse Rate 88  Pulse Rate Source Monitor  Resp 19  BP (!) 129/92  BP Location Right Arm  BP Method Automatic  Patient Position (if appropriate) Sitting  Oxygen Therapy  SpO2 94 %  O2 Device Room Air  Pulse Oximetry Type Continuous  Machine Checks  Machine Number 6  Station Number 3  UF/Alarm Test Passed  Conductivity: Meter 13.8  Conductivity: Machine  13.9  pH 7.2  Reverse Osmosis Main  Normal Saline Lot Number [id number]  Dialyzer Lot Number [id number]  Disposable Set Lot Number [id number]  Machine Temperature 98.6 F (37 C)  Musician and Audible Yes  Blood Lines Intact and Secured Yes  Pre Treatment Patient Checks  Vascular access used during treatment Catheter  HD catheter dressing before treatment WDL  Patient is receiving dialysis in a chair Yes  Hepatitis B Surface Antigen Results Negative  Date Hepatitis B Surface Antigen Drawn 12/25/18  Hepatitis B Surface Antibody  (<10)  Date Hepatitis B Surface Antibody Drawn 12/25/18  Hemodialysis Consent Verified Yes  Hemodialysis Standing Orders Initiated Yes  ECG (Telemetry) Monitor On Yes  Prime Ordered Normal Saline  Length of  DialysisTreatment -hour(s) 3 Hour(s)  Dialysis Treatment Comments Na 140  Dialyzer Elisio 17H NR  Dialysate 3K;2.5 Ca  Dialysis Anticoagulant None  Dialysate Flow Ordered 600  Blood Flow Rate Ordered 300 mL/min  Ultrafiltration Goal 0.5 Liters  Dialysis Blood Pressure Support Ordered Normal Saline  During Hemodialysis Assessment  Blood Flow Rate (mL/min) 300 mL/min  Arterial Pressure (mmHg) -180 mmHg  Venous Pressure (mmHg) 150 mmHg  Transmembrane Pressure (mmHg) 50 mmHg  Ultrafiltration Rate (mL/min) 210 mL/min  Dialysate Flow Rate (mL/min) 600 ml/min  Conductivity: Machine  13.9  HD Safety Checks Performed Yes  Dialysis Fluid Bolus Normal Saline  Bolus Amount (mL) 250 mL  Intra-Hemodialysis Comments Tx initiated

## 2018-12-30 NOTE — Consult Note (Signed)
[place] for Warfarin Indication: atrial fibrillation  Allergies  Allergen Reactions  . Influenza Vaccines   . Iodinated Diagnostic Agents     IVP Dye  . Metformin Hcl Nausea Only and Nausea And Vomiting  . Penicillins   . Sulfa Antibiotics     Patient Measurements: Height: 5' (152.4 cm) Weight: 206 lb 9.1 oz (93.7 kg) IBW/kg (Calculated) : 45.5   Vital Signs: Temp: 97.7 F (36.5 C) (12/16 0036) Temp Source: Oral (12/16 0036) BP: 114/87 (12/16 0036) Pulse Rate: 103 (12/16 0036)  Labs: Recent Labs    12/28/18 0441 12/29/18 0800 12/30/18 0418  HGB 13.9 13.9 13.8  HCT 38.6 39.0 37.5  PLT 298 294 270  LABPROT 17.7* 22.8* 24.7*  INR 1.5* 2.0* 2.2*  CREATININE 14.07* 12.30* 9.60*    Estimated Creatinine Clearance: 5 mL/min (A) (by C-G formula based on SCr of 9.6 mg/dL (H)).   Medications:  Warfarin 1 mg daily- last dose 12/23/18  Assessment: Pharmacy has been consulted for warfarin in patient with a PMH significant for atrial fibrillation.   Date INR  Dose  12/9 1.3 ----- 12/10 1.3       2 mg 12/11   1.3 2 mg 12/12 1.3 2.5 mg 12/13 1.4 2.5 mg 12/14 1.5 Dose not given- pt not alert enough per MAR? 12/15  2.0 2.5 mg 12/16 2.2  Goal of Therapy:  INR 2-3 Monitor platelets by anticoagulation protocol: Yes   Plan:  INR therapeutic. Will give warfarin 2 mg tonight. F/u INR with am labs. CBC q72h.   Tawnya Crook, PharmD Clinical Pharmacist 12/30/2018 7:40 AM

## 2018-12-30 NOTE — Progress Notes (Signed)
[place], [place] 12/30/18  Subjective:  Patient due for third dialysis treatment today. Still inquiring as to when she can go home.   Objective:  Vital signs in last 24 hours:  Temp:  [97.6 F (36.4 C)-98.6 F (37 C)] 97.8 F (36.6 C) (12/16 0755) Pulse Rate:  [25-107] 95 (12/16 0934) Resp:  [15-19] 17 (12/16 0755) BP: (114-146)/(76-129) 117/85 (12/16 0934) SpO2:  [94 %-98 %] 94 % (12/16 0755)  Weight change:  Filed Weights   12/23/18 1943 12/29/18 0930  Weight: 90.7 kg 93.7 kg    Intake/Output:    Intake/Output Summary (Last 24 hours) at 12/30/2018 1029 Last data filed at 12/30/2018 0500 Gross per 24 hour  Intake --  Output 2 ml  Net -2 ml   Physical Exam: General:  Chronically ill-appearing, frail, laying in the bed  HEENT  moist oral mucous membranes  Lungs:  Clear bilateral, normal effort  Heart::  Irregular  Abdomen:  Soft, nontender, previous surgical scar  Extremities:  2+ LE edmea  Neurologic:  Alert, able to follow commands  Skin:  Bruising on the left side of the neck noted.  Access:  L IJ permcath       Basic Metabolic Panel:  Recent Labs  Lab 12/23/18 2008 12/26/18 0511 12/27/18 0449 12/28/18 0441 12/29/18 0800 12/30/18 0418  NA 130* 130* 128* 127* 128* 133*  K 4.7 4.1 4.6 5.1 6.1* 4.8  CL 86* 83* 82* 83* 88* 92*  CO2 13* 22 20* 18* 17* 21*  GLUCOSE 100* 108* 76 109* 137* 185*  BUN 142* 145* 145* 163* 122* 86*  CREATININE 15.47* 13.42* 13.67* 14.07* 12.30* 9.60*  CALCIUM 6.9* 5.1* 5.1* 5.3* 5.6* 5.9*  MG 2.3  --   --   --   --   --   PHOS  --  11.1* 11.6* 11.8* 10.9* 8.2*     CBC: Recent Labs  Lab 12/23/18 2008 12/26/18 0511 12/27/18 0449 12/28/18 0441 12/29/18 0800 12/30/18 0418  WBC 19.8* 13.4* 15.4* 13.0* 11.9* 14.1*  NEUTROABS 17.4*  --   --   --   --   --   HGB 15.2* 12.9 13.5 13.9 13.9 13.8  HCT 42.5 35.8* 35.8* 38.6 39.0 37.5  MCV 87.4 84.8 81.9 85.4 87.2 83.9  PLT 319 286 268 298  294 270      Lab Results  Component Value Date   HEPBSAG NON REACTIVE 12/25/2018   HEPBSAB NON REACTIVE 12/25/2018      Microbiology:  Recent Results (from the past 240 hour(s))  GI pathogen panel by PCR, stool     Status: None   Collection Time: 12/23/18 10:28 PM   Specimen: STOOL  Result Value Ref Range Status   Plesiomonas shigelloides NOT DETECTED NOT DETECTED Final   Yersinia enterocolitica NOT DETECTED NOT DETECTED Final   Vibrio NOT DETECTED NOT DETECTED Final   Enteropathogenic E coli NOT DETECTED NOT DETECTED Final   E coli (ETEC) LT/ST NOT DETECTED NOT DETECTED Final   E coli 1194 by PCR Not applicable NOT DETECTED Final   Cryptosporidium by PCR NOT DETECTED NOT DETECTED Final   Entamoeba histolytica NOT DETECTED NOT DETECTED Final   Adenovirus F 40/41 NOT DETECTED NOT DETECTED Final   Norovirus GI/GII NOT DETECTED NOT DETECTED Final   Sapovirus NOT DETECTED NOT DETECTED Final    Comment: (NOTE) Performed At: [facility] [address] [place], [place] [id number] Rush Farmer MD [id number]    Vibrio cholerae NOT  DETECTED NOT DETECTED Final   Campylobacter by PCR NOT DETECTED NOT DETECTED Final   Salmonella by PCR NOT DETECTED NOT DETECTED Final   E coli (STEC) NOT DETECTED NOT DETECTED Final   Enteroaggregative E coli NOT DETECTED NOT DETECTED Final   Shigella by PCR NOT DETECTED NOT DETECTED Final   Cyclospora cayetanensis NOT DETECTED NOT DETECTED Final   Astrovirus NOT DETECTED NOT DETECTED Final   G lamblia by PCR NOT DETECTED NOT DETECTED Final   Rotavirus A by PCR NOT DETECTED NOT DETECTED Final  C Difficile Quick Screen w PCR reflex     Status: None   Collection Time: 12/23/18 10:28 PM   Specimen: STOOL  Result Value Ref Range Status   C Diff antigen NEGATIVE NEGATIVE Final   C Diff toxin NEGATIVE NEGATIVE Final   C Diff interpretation No C. difficile detected.  Final    Comment: Performed at [facility], [address] [place], [place] [address]  Urine culture     Status: Abnormal   Collection Time: 12/23/18 10:28 PM   Specimen: In/Out Cath Urine  Result Value Ref Range Status   Specimen Description   Final    IN/OUT CATH URINE Performed at [facility], [address] [place], [place] [address]    Special Requests   Final    NONE Performed at [facility], [address] [place], [place] [address]    Culture (A)  Final    7,000 COLONIES/mL ESCHERICHIA COLI 1,000 COLONIES/mL ENTEROCOCCUS FAECALIS    Report Status 12/27/2018 FINAL  Final   Organism ID, Bacteria ESCHERICHIA COLI (A)  Final   Organism ID, Bacteria ENTEROCOCCUS FAECALIS (A)  Final      Susceptibility   Escherichia coli - MIC*    AMPICILLIN >=32 RESISTANT Resistant     CEFAZOLIN <=4 SENSITIVE Sensitive     CEFTRIAXONE <=1 SENSITIVE Sensitive     CIPROFLOXACIN >=4 RESISTANT Resistant     GENTAMICIN <=1 SENSITIVE Sensitive     IMIPENEM <=0.25 SENSITIVE Sensitive     NITROFURANTOIN <=16 SENSITIVE Sensitive     TRIMETH/SULFA >=320 RESISTANT Resistant     AMPICILLIN/SULBACTAM 16 INTERMEDIATE Intermediate     PIP/TAZO <=4 SENSITIVE Sensitive     * 7,000 COLONIES/mL ESCHERICHIA COLI   Enterococcus faecalis - MIC*    AMPICILLIN <=2 SENSITIVE Sensitive     NITROFURANTOIN <=16 SENSITIVE Sensitive     VANCOMYCIN 1 SENSITIVE Sensitive     * 1,000 COLONIES/mL ENTEROCOCCUS FAECALIS  Blood Culture (routine x 2)     Status: None   Collection Time: 12/23/18 10:29 PM   Specimen: BLOOD  Result Value Ref Range Status   Specimen Description BLOOD RIGHT ANTECUBITAL  Final   Special Requests   Final    BOTTLES DRAWN AEROBIC AND ANAEROBIC Blood Culture results may not be optimal due to an excessive volume of blood received in culture bottles   Culture   Final    NO GROWTH 5 DAYS Performed at [facility], [address] [place], [place] [address]    Report Status 12/28/2018 FINAL  Final  SARS CORONAVIRUS 2 (TAT  6-24 HRS) Nasopharyngeal Nasopharyngeal Swab     Status: None   Collection Time: 12/24/18 12:07 AM   Specimen: Nasopharyngeal Swab  Result Value Ref Range Status   SARS Coronavirus 2 NEGATIVE NEGATIVE Final    Comment: (NOTE) SARS-CoV-2 target nucleic acids are NOT DETECTED. The SARS-CoV-2 RNA is generally detectable in upper and lower  respiratory specimens during the acute phase of infection. Negative results do not preclude SARS-CoV-2 infection, do not rule out co-infections with other pathogens, and should not be used as the sole basis for treatment or other patient management decisions. Negative results must be combined with clinical observations, patient history, and epidemiological information. The expected result is Negative. Fact Sheet for Patients: SugarRoll.be Fact Sheet for Healthcare Providers: https://www.woods-mathews.com/ This test is not yet approved or cleared by the [place] FDA and  has been authorized for detection and/or diagnosis of SARS-CoV-2 by FDA under an Emergency Use Authorization (EUA). This EUA will remain  in effect (meaning this test can be used) for the duration of the COVID-19 declaration under Section 56 4(b)(1) of the Act, 21 U.S.C. section 360bbb-3(b)(1), unless the authorization is terminated or revoked sooner. Performed at [facility], [place] [address] [place], [place] [address]   Urine Culture     Status: Abnormal   Collection Time: 12/27/18  6:41 AM   Specimen: Urine, Random  Result Value Ref Range Status   Specimen Description   Final    URINE, RANDOM Performed at [facility], [address], [place], [place] [address]    Special Requests   Final    NONE Performed at [facility], [address] [place],  [address]    Culture 80,000 COLONIES/mL ENTEROCOCCUS FAECALIS (A)  Final   Report Status 12/30/2018 FINAL  Final   Organism ID, Bacteria ENTEROCOCCUS  FAECALIS (A)  Final      Susceptibility   Enterococcus faecalis - MIC*    AMPICILLIN <=2 SENSITIVE Sensitive     NITROFURANTOIN <=16 SENSITIVE Sensitive     VANCOMYCIN 1 SENSITIVE Sensitive     * 80,000 COLONIES/mL ENTEROCOCCUS FAECALIS    Coagulation Studies: Recent Labs    12/28/18 0441 12/29/18 0800 12/30/18 0418  LABPROT 17.7* 22.8* 24.7*  INR 1.5* 2.0* 2.2*    Urinalysis: No results for input(s): COLORURINE, LABSPEC, PHURINE, GLUCOSEU, HGBUR, BILIRUBINUR, KETONESUR, PROTEINUR, UROBILINOGEN, NITRITE, LEUKOCYTESUR in the last 72 hours.  Invalid input(s): APPERANCEUR    Imaging: PERIPHERAL VASCULAR CATHETERIZATION  Result Date: 12/28/2018 See op note    Medications:   . sodium chloride     . calcium carbonate  200 mg of elemental calcium Oral TID WC  . Chlorhexidine Gluconate Cloth  6 each Topical Daily  . cholecalciferol  5,000 Units Oral Daily  . metoprolol tartrate  25 mg Oral BID  . sodium chloride flush  3 mL Intravenous Q12H  . warfarin  2 mg Oral ONCE-1800  . Warfarin - Pharmacist Dosing Inpatient   Does not apply q1800   acetaminophen, ALPRAZolam, HYDROmorphone (DILAUDID) injection, loperamide, metoprolol tartrate, ondansetron (ZOFRAN) IV, sodium chloride flush  Assessment/ Plan:  78 y.o. female with Diabetes, atrial fibrillation, hypertension, history of, atrial fibrillation with chronic anticoagulation, chronic left hydronephrosis , aortic atherosclerosis, was admitted on 12/23/2018 with Sepsis [facility])  Patient Active Problem List   Diagnosis Date Noted  . Other cirrhosis of liver ([place]) 12/25/2018  . Hydronephrosis, left 12/25/2018  . AKI (acute kidney injury) ([place]) 12/24/2018  . Colitis, acute 12/24/2018  . Elevated troponin 12/24/2018  . Essential hypertension 12/24/2018  . Atrial fibrillation, chronic ([place]) 12/24/2018  . Asplenia 05/19/2017  . Rectal cancer ([place]) 04/24/2016  . Panic disorder 07/03/2015  . Allergic rhinitis 03/23/2015  .  Hypertension 07/04/2014  . Hypercholesteremia 07/04/2014  . Osteopenia 07/04/2014  . Vitamin D deficiency 07/04/2014  . Anxiety 07/04/2014  .  CLL (chronic lymphocytic leukemia) ([place]) 07/04/2014  . Non-insulin dependent type 2 diabetes mellitus ([place]) 07/04/2014  . Diverticulitis 07/04/2014  . H/O malignant neoplasm of colon 07/04/2014  . Adiposity 07/04/2014  . Hernia of anterior abdominal wall 03/23/2013    #Acute kidney injury/hyperphosphatemia/proteinuria -Likely ATN from concurrent illness, possibly sepsis -Patient has chronic left hydronephrosis known at least since 2015, although patient reports no knowledge of this condition. Baseline creatinine of 0.89 from April 2020 -Patient has been evaluated by urology team. no intervention. -Screening serologies -ANA negative, ANCA negative, complements normal, kappa lambda light chain ratio normal  -Renal parameters have improved with dialysis treatments.  We will plan for hemodialysis again today.  In addition we will add SPEP, UPEP, GBM antibodies to her serologic work-up.  Her condition is a challenging 1.  Ideally we would like to consider renal biopsy however given her solitary functional kidney the risks are a bit higher.  In addition we would like to further improve her uremia before considering this.  #Cirrhosis of the liver -Continue to monitor clinically.  #Acidosis -Improved significantly as serum bicarbonate now up to 21.  #Diarrhea -C. difficile studies are negative -CT abdomen, noncontrast study reports focal areas of thickening involving cecum and ascending colon  #Non-insulin-dependent diabetes mellitus - Lab Results  Component Value Date   HGBA1C 6.1 (H) 12/24/2018   #Hyperkalemia. Potassium down to 4.8 with ongoing dialysis treatments.  Continue to monitor.    Results for ZONA, PEDRO (MRN [id number]) as of 12/27/2018 12:41  Ref. Range 12/24/2018 05:00  Cytoplasmic (C-ANCA) Latest Ref Range:  Neg:<1:20 titer <1:20  P-ANCA Latest Ref Range: Neg:<1:20 titer <1:20  Atypical P-ANCA titer Latest Ref Range: Neg:<1:20 titer <1:20  C3 Complement Latest Ref Range: 82 - 167 mg/dL 124  Complement C4, Body Fluid Latest Ref Range: 12 - 38 mg/dL 28  Kappa free light chain Latest Ref Range: 3.3 - 19.4 mg/L 257.0 (H)  Lamda free light chains Latest Ref Range: 5.7 - 26.3 mg/L 162.0 (H)  Kappa, lamda light chain ratio Latest Ref Range: 0.26 - 1.65  1.59     LOS: 6 Sanii Kukla 12/16/202010:29 AM  [place], [place]  Note: This note was prepared with Dragon dictation. Any transcription errors are unintentional

## 2018-12-30 NOTE — Evaluation (Signed)
Physical Therapy Evaluation Patient Details Name: Danielle Rowland MRN: [id number] DOB: 1940-05-19 Today's Date: 12/30/2018   History of Present Illness  Pt is a 78 y.o. female presenting to hospital 12/9 with diarrhea, weakness, and vomiting; noted to have rapid a-fib in ER.  Pt admitted with sepsis secondary acute gastroenteritis, AKI, a-fib with RVR, elevated troponin (possibly demand ischemia), and hypocalcemia.  Pt s/p L IJ HD catheter placement (permcath) 12/28/18.  PMH includes panic disorder, htn, DM, CLL, rectal CA, R chest port.  Clinical Impression  Prior to hospital admission, pt reports being independent with ambulation; lives alone; and has support from her sister. Currently pt is mod to max assist semi-supine to sitting edge of bed and min to mod assist x1 to stand and take steps bed to dialysis recliner.  Pt agreeable to PT session but pt reporting having a "panic attack" once sitting edge of bed; pt reporting the room was closing in on her but did not want to lay back down in bed; therapist provided support to pt (based on pt's feedback on how best to assist her) and therapist called pt's nurse immediately and nurse came to assess pt and assist.  Transport came with dialysis chair to take pt to dialysis; pt agreeable to getting to dialysis chair and able to transfer to chair with assist.  Therapist and transport assisted with making pt comfortable in chair and then transport left with pt to go to dialysis.  Pt would benefit from skilled PT to address noted impairments and functional limitations (see below for any additional details).  Pt demonstrates generalized weakness and decreased activity tolerance compared to baseline.  Upon hospital discharge, pt would benefit from STR (but pt would prefer to discharge home instead).    Follow Up Recommendations SNF    Equipment Recommendations  Rolling walker with 5" wheels;3in1 (PT);Wheelchair (measurements PT);Wheelchair cushion  (measurements PT)(youth sized)    Recommendations for Other Services OT consult     Precautions / Restrictions Precautions Precautions: Fall Precaution Comments: L IJ HD permcath; R chest port Restrictions Weight Bearing Restrictions: No      Mobility  Bed Mobility Overal bed mobility: Needs Assistance Bed Mobility: Supine to Sit     Supine to sit: Mod assist;Max assist;HOB elevated     General bed mobility comments: assist for trunk and B LE's; vc's for technique  Transfers Overall transfer level: Needs assistance Equipment used: None Transfers: Stand Pivot Transfers;Sit to/from Stand Sit to Stand: Mod assist Stand pivot transfers: Min assist;Mod assist       General transfer comment: assist to initiate and come to full stand with B UE support; stand step turn bed to dialysis chair with assist to steady; increased effort to perform  Ambulation/Gait Ambulation/Gait assistance: (Deferred d/t transport present to take pt to dialysis (in dialysis chair))              Stairs            Wheelchair Mobility    Modified Rankin (Stroke Patients Only)       Balance Overall balance assessment: Needs assistance Sitting-balance support: No upper extremity supported;Feet supported Sitting balance-Leahy Scale: Good Sitting balance - Comments: steady sitting reaching within BOS   Standing balance support: Bilateral upper extremity supported Standing balance-Leahy Scale: Poor Standing balance comment: pt requiring B UE support to maintain standing balance  Pertinent Vitals/Pain   Vitals (HR and O2 on room air) stable and WFL throughout treatment session.    Home Living Family/patient expects to be discharged to:: Private residence Living Arrangements: Alone Available Help at Discharge: Family Type of Home: Apartment(1st floor) Home Access: Level entry     Home Layout: One level        Prior Function Level of  Independence: Independent         Comments: Sister assists pt with driving and taking her to grocery store.  Pt does her own cleaning.  Assists at florist about 3x/week (florist drives to pick pt up).     Hand Dominance        Extremity/Trunk Assessment   Upper Extremity Assessment Upper Extremity Assessment: Generalized weakness    Lower Extremity Assessment Lower Extremity Assessment: Generalized weakness    Cervical / Trunk Assessment Cervical / Trunk Assessment: (forward head)  Communication   Communication: No difficulties  Cognition Arousal/Alertness: Awake/alert Behavior During Therapy: Anxious Overall Cognitive Status: Within Functional Limits for tasks assessed                                 General Comments: Pt reported having "panic attack" sitting edge of bed.      General Comments   Nursing cleared pt for participation in physical therapy.  Pt agreeable to PT session.  Pt's nurse and unit assistant director notified of pt's reports of claustrophobia in room and request for larger room.    Exercises  Transfer training   Assessment/Plan    PT Assessment Patient needs continued PT services  PT Problem List Decreased strength;Decreased activity tolerance;Decreased balance;Decreased mobility;Decreased knowledge of use of DME;Decreased knowledge of precautions       PT Treatment Interventions DME instruction;Gait training;Functional mobility training;Therapeutic activities;Therapeutic exercise;Balance training;Patient/family education    PT Goals (Current goals can be found in the Care Plan section)  Acute Rehab PT Goals Patient Stated Goal: to go home PT Goal Formulation: With patient Time For Goal Achievement: 01/13/19 Potential to Achieve Goals: Fair    Frequency Min 2X/week   Barriers to discharge Decreased caregiver support      Co-evaluation               AM-PAC PT "6 Clicks" Mobility  Outcome Measure Help needed  turning from your back to your side while in a flat bed without using bedrails?: A Little Help needed moving from lying on your back to sitting on the side of a flat bed without using bedrails?: A Lot Help needed moving to and from a bed to a chair (including a wheelchair)?: A Lot Help needed standing up from a chair using your arms (e.g., wheelchair or bedside chair)?: A Lot Help needed to walk in hospital room?: Total Help needed climbing 3-5 steps with a railing? : Total 6 Click Score: 11    End of Session Equipment Utilized During Treatment: Gait belt Activity Tolerance: Other (comment)(Limited d/t pt reporting having a "panic attack" sitting edge of bed and then dialysis present to take pt to dialysis) Patient left: (in dialysis chair with transport present) Nurse Communication: Mobility status;Precautions;Other (comment)(pt reporting having "panic attack") PT Visit Diagnosis: Unsteadiness on feet (R26.81);Other abnormalities of gait and mobility (R26.89);Muscle weakness (generalized) (M62.81);Difficulty in walking, not elsewhere classified (R26.2)    Time: 5093-2671 PT Time Calculation (min) (ACUTE ONLY): 29 min   Charges:   PT Evaluation $PT Eval Low  Complexity: 1 Low PT Treatments $Therapeutic Activity: 8-22 mins        Leitha Bleak, PT 12/30/18, 5:28 PM

## 2018-12-30 NOTE — Progress Notes (Signed)
{  Post HD TX Assessment   12/30/18 1520  Neurological  Level of Consciousness Alert  Orientation Level Oriented to person;Oriented to place;Disoriented to time;Disoriented to situation  Respiratory  Respiratory Pattern Regular;Unlabored  Chest Assessment Chest expansion symmetrical  Cough Weak  Cardiac  Pulse Irregular  Heart Sounds S1, S2  ECG Monitor Yes  Cardiac Rhythm Atrial fibrillation  Vascular  R Radial Pulse +2  L Radial Pulse +2  Edema Generalized  Musculoskeletal  Musculoskeletal (WDL) X  Generalized Weakness Yes  Gastrointestinal  Bowel Sounds Assessment Active  Last BM Date 12/28/18  Psychosocial  Psychosocial (WDL) WDL  Patient Behaviors Calm;Cooperative;Anxious;Tearful  Needs Expressed Emotional  Emotional support given Given to patient

## 2018-12-30 NOTE — Progress Notes (Signed)
Post HD Tx Note    12/30/18 1515  Hand-Off documentation  Report given to (Full Name) Tilden Fossa RN   Report received from (Full Name) Newt Minion RN   Vital Signs  Temp 98.4 F (36.9 C)  Temp Source Oral  Pulse Rate (!) 41  Pulse Rate Source Monitor  Resp 17  BP 123/80  BP Location Right Arm  BP Method Automatic  Patient Position (if appropriate) Sitting  Oxygen Therapy  SpO2 93 %  O2 Device Room Air  Post-Hemodialysis Assessment  Rinseback Volume (mL) 250 mL  KECN 39.1 V  Dialyzer Clearance Lightly streaked  Duration of HD Treatment -hour(s) 3 hour(s)  Hemodialysis Intake (mL) 500 mL  UF Total -Machine (mL) 1510 mL  Net UF (mL) 1010 mL  Tolerated HD Treatment Yes  Hemodialysis Catheter Left Internal jugular Double lumen Permanent (Tunneled)  Placement Date/Time: 12/28/18 1612   Time Out: Correct patient;Correct site;Correct procedure  Maximum sterile barrier precautions: Hand hygiene;Cap;Mask;Sterile gown;Sterile gloves;Large sterile sheet  Site Prep: Chlorhexidine (preferred)  Local Anes...  Site Condition No complications  Blue Lumen Status Heparin locked  Red Lumen Status Heparin locked  Purple Lumen Status N/A  Catheter fill solution Heparin 1000 units/ml  Catheter fill volume (Arterial) 1.7 cc  Catheter fill volume (Venous) 1.7  Dressing Type Biopatch;Occlusive  Dressing Status Clean;Dry;Intact  Interventions New dressing  Drainage Description None  Dressing Change Due 01/03/19  Post treatment catheter status Capped and Clamped

## 2018-12-30 NOTE — Plan of Care (Signed)
  Problem: Clinical Measurements: Goal: Ability to maintain clinical measurements within normal limits will improve Outcome: Progressing Goal: Will remain free from infection Outcome: Progressing Goal: Diagnostic test results will improve Outcome: Progressing Goal: Respiratory complications will improve Outcome: Progressing Goal: Cardiovascular complication will be avoided Outcome: Progressing  Pt Tolerated well HD Tx. 1060ml fluids removed

## 2018-12-31 ENCOUNTER — Inpatient Hospital Stay: Payer: Medicare Other

## 2018-12-31 LAB — RENAL FUNCTION PANEL
Albumin: 2.3 g/dL — ABNORMAL LOW (ref 3.5–5.0)
Anion gap: 20 — ABNORMAL HIGH (ref 5–15)
BUN: 67 mg/dL — ABNORMAL HIGH (ref 8–23)
CO2: 21 mmol/L — ABNORMAL LOW (ref 22–32)
Calcium: 6.6 mg/dL — ABNORMAL LOW (ref 8.9–10.3)
Chloride: 92 mmol/L — ABNORMAL LOW (ref 98–111)
Creatinine, Ser: 7.51 mg/dL — ABNORMAL HIGH (ref 0.44–1.00)
GFR calc Af Amer: 5 mL/min — ABNORMAL LOW (ref >60)
GFR calc non Af Amer: 5 mL/min — ABNORMAL LOW (ref >60)
Glucose, Bld: 105 mg/dL — ABNORMAL HIGH (ref 70–99)
Phosphorus: 6.5 mg/dL — ABNORMAL HIGH (ref 2.5–4.6)
Potassium: 4.4 mmol/L (ref 3.5–5.1)
Sodium: 133 mmol/L — ABNORMAL LOW (ref 135–145)

## 2018-12-31 LAB — PROTEIN ELECTROPHORESIS, SERUM
A/G Ratio: 0.6 — ABNORMAL LOW (ref 0.7–1.7)
Albumin ELP: 2.3 g/dL — ABNORMAL LOW (ref 2.9–4.4)
Alpha-1-Globulin: 0.5 g/dL — ABNORMAL HIGH (ref 0.0–0.4)
Alpha-2-Globulin: 1.1 g/dL — ABNORMAL HIGH (ref 0.4–1.0)
Beta Globulin: 1.3 g/dL (ref 0.7–1.3)
Gamma Globulin: 0.8 g/dL (ref 0.4–1.8)
Globulin, Total: 3.6 g/dL (ref 2.2–3.9)
Total Protein ELP: 5.9 g/dL — ABNORMAL LOW (ref 6.0–8.5)

## 2018-12-31 LAB — PROTIME-INR
INR: 2 — ABNORMAL HIGH (ref 0.8–1.2)
Prothrombin Time: 22.4 seconds — ABNORMAL HIGH (ref 11.4–15.2)

## 2018-12-31 LAB — C3 COMPLEMENT: C3 Complement: 116 mg/dL (ref 82–167)

## 2018-12-31 LAB — GLUCOSE, CAPILLARY
Glucose-Capillary: 111 mg/dL — ABNORMAL HIGH (ref 70–99)
Glucose-Capillary: 119 mg/dL — ABNORMAL HIGH (ref 70–99)
Glucose-Capillary: 67 mg/dL — ABNORMAL LOW (ref 70–99)
Glucose-Capillary: 80 mg/dL (ref 70–99)
Glucose-Capillary: 83 mg/dL (ref 70–99)

## 2018-12-31 LAB — GLOMERULAR BASEMENT MEMBRANE ANTIBODIES: GBM Ab: 3 units (ref 0–20)

## 2018-12-31 LAB — HEPATITIS B CORE ANTIBODY, IGM: Hep B C IgM: NONREACTIVE

## 2018-12-31 LAB — C4 COMPLEMENT: Complement C4, Body Fluid: 27 mg/dL (ref 12–38)

## 2018-12-31 MED ORDER — WARFARIN SODIUM 2.5 MG PO TABS
2.5000 mg | ORAL_TABLET | Freq: Once | ORAL | Status: AC
  Administered 2018-12-31: 2.5 mg via ORAL
  Filled 2018-12-31: qty 1

## 2018-12-31 NOTE — Progress Notes (Signed)
[name] NAME: Danielle Rowland    MR#:  [id number]  DATE OF BIRTH:  Nov 24, 1940  SUBJECTIVE:  overall improving. Patient reports she wants to go home-- gets anxious when told her rehab would be a good option left upper extremity swollen more than usual sister Danielle Rowland in the room  REVIEW OF SYSTEMS:   Review of Systems  Constitutional: Negative for chills, fever and weight loss.  HENT: Negative for ear discharge, ear pain and nosebleeds.   Eyes: Negative for blurred vision, pain and discharge.  Respiratory: Negative for sputum production, shortness of breath, wheezing and stridor.   Cardiovascular: Negative for chest pain, palpitations, orthopnea and PND.  Gastrointestinal: Negative for abdominal pain, diarrhea, nausea and vomiting.  Genitourinary: Negative for frequency and urgency.  Musculoskeletal: Negative for back pain and joint pain.  Neurological: Negative for sensory change, speech change, focal weakness and weakness.  Psychiatric/Behavioral: Negative for depression and hallucinations. The patient is not nervous/anxious.    Tolerating Diet: renal diet Tolerating PT: rehab  DRUG ALLERGIES:   Allergies  Allergen Reactions  . Influenza Vaccines   . Iodinated Diagnostic Agents     IVP Dye  . Metformin Hcl Nausea Only and Nausea And Vomiting  . Penicillins   . Sulfa Antibiotics     VITALS:  Blood pressure 126/90, pulse (!) 50, temperature 98 F (36.7 C), temperature source Oral, resp. rate 20, height 5' (1.524 m), weight 93.7 kg, SpO2 95 %.  PHYSICAL EXAMINATION:   Physical Exam  GENERAL:  78 y.o.-year-old patient lying in the bed with no acute distress. Obese morbid EYES: Pupils equal, round, reactive to light and accommodation. No scleral icterus. Extraocular muscles intact.  HEENT: Head atraumatic, normocephalic. Oropharynx and nasopharynx clear.  NECK:  Supple, no jugular venous distention. No thyroid  enlargement, no tenderness.  Left IJ HD catheter  LUNGS: Normal breath sounds bilaterally, no wheezing, rales, rhonchi. No use of accessory muscles of respiration.  CARDIOVASCULAR: S1, S2 normal. No murmurs, rubs, or gallops.  ABDOMEN: Soft, nontender, nondistended. Bowel sounds present. No organomegaly or mass.  EXTREMITIES: No cyanosis, clubbing  +edema b/l.   Left upper extremity chronic edema NEUROLOGIC: Cranial nerves II through XII are intact. No focal Motor or sensory deficits b/l.   PSYCHIATRIC:  patient is alert and oriented x 3. Anxious SKIN: No obvious rash, lesion, or ulcer.   LABORATORY PANEL:  CBC Recent Labs  Lab 12/30/18 0418  WBC 14.1*  HGB 13.8  HCT 37.5  PLT 270    Chemistries  Recent Labs  Lab 12/31/18 0620  NA 133*  K 4.4  CL 92*  CO2 21*  GLUCOSE 105*  BUN 67*  CREATININE 7.51*  CALCIUM 6.6*   Cardiac Enzymes No results for input(s): TROPONINI in the last 168 hours. RADIOLOGY:  No results found. ASSESSMENT AND PLAN:  78 y/o female with history of diabetes, A fib on coumadin, HTN and CLL, presents with 4 day history of diarrhea and vomiting. She was noted to be in acute renal failure with creatinine of 13, metabolic acidosis and elevated wbc count. CT abdomen showed evidence of colitis. C diff and GI pathogen panel was found to be negative.  1. Acute gastroenteritis--improved -Sepsis on admission ruled out.  -received  IV hydration.  -Stool for C. difficile and GI pathogen panel negative.  -Blood culture has shown no growth. Overall diarrhea improving. She is afebrile at this time. -Lactic acidosis is improving with IV  hydration.  2. Acute kidney injury/ATN with volume loss at [place]. Likely related to dehydration from diarrhea/colitis.  -Nephrology consulted-- started on hemodialysis. -Renal ultrasound showed left-sided hydronephrosis, but this was a chronic finding. Seen by urology and stent placement was not felt to be indicated. CK  levels noted to be normal. - PermCath was placed on 12/14 and she underwent her dialysis. No UOP documented -patient likely will need dialysis as outpatient-- she worker consulted  3. Acute Metabolic acidosis. Suspect is related to GI losses/dehydration/renal failure.  She was treated with bicarbonate infusion which improved serum bicarb.  Anticipate further improvement with dialysis.  4. Elevated troponin. Mild. Suspect this is demand ischemia in the setting of acute illness.  5. Type 2 diabetes, non-insulin-dependent, hold oral agents.  She was having episodes of hypoglycemia.  Continue on dextrose in maintenance fluid.  A1c of 6.1. -Sugars stable. DC dextrose drip  6. Chronic atrial fibrillation. Presented with rapid ventricular response in the setting of dehydration and acute illness.  -She is anticoagulated with Coumadin.    -Continue rate control with metoprolol. Heart rate stable  7. Chronic lymphocytic leukemia. Follow-up with oncology as an outpatient.  8. Generalized weakness with deconditioning -says she ambulates at home without any help. She hasn't gotten out of bed for last six days. PT consultation placed.  Hypocalcemia.  Calcium noted to be 5.3 this morning.  When corrected for albumin, it is still low at 7.1.  Ionized calcium has been less than 3.    Spoke with patient's sister Danielle Rowland in the room. Given multiple medical issues and comorbidities along with new dialysis patient will benefit from rehab at this time. Waiting for bed offers. Patient has dialysis chair time obtained. Repeat: ordered. Once aboverequirements are met patient will discharged anticipated tomorrow   Procedures: HD catheter placement Family communication : sister Danielle Rowland's  Consults : vascular nephrology  discharge Disposition : TBD CODE STATUS: DNR prior to admission DVT Prophylaxis :warfarin  TOTAL TIME TAKING CARE OF THIS PATIENT: **25* minutes.  >50% time spent on counselling and  coordination of care  POSSIBLE D/C IN **few* DAYS, DEPENDING ON CLINICAL CONDITION.  Note: This dictation was prepared with Dragon dictation along with smaller phrase technology. Any transcriptional errors that result from this process are unintentional.  Danielle Rowland M.D on 12/31/2018 at 3:26 PM  Between 7am to 6pm - Pager - [phone]  After 6pm go to [email or web address]  [facility]   CC: Primary care physician; Birdie Sons, MDPatient ID: Danielle Rowland, female   DOB: 1940-04-28, 78 y.o.   MRN: [id number]

## 2018-12-31 NOTE — TOC Progression Note (Signed)
Transition of Care [facility]) - Progression Note    Patient Details  Name: Danielle Rowland MRN: [id number] Date of Birth: 1940/08/22  Transition of Care [facility]) CM/SW [place], RN Phone Number: 12/31/2018, 2:21 PM  Clinical Narrative:      Met with patient and sister.  Patient has agreed to go to short term rehab.   Bed Search has been started.  FL2 and PASSR completed.   Dialysis Chair Time is Monday, Wednesday, Friday.     Expected Discharge Plan: [place] Barriers to Discharge: Continued Medical Work up, SNF Pending bed offer  Expected Discharge Plan and Services Expected Discharge Plan: [place] arrangements for the past 2 months: Apartment                                       Social Determinants of Health (SDOH) Interventions    Readmission Risk Interventions No flowsheet data found.

## 2018-12-31 NOTE — Consult Note (Signed)
[name] for Warfarin Indication: atrial fibrillation  Allergies  Allergen Reactions  . Influenza Vaccines   . Iodinated Diagnostic Agents     IVP Dye  . Metformin Hcl Nausea Only and Nausea And Vomiting  . Penicillins   . Sulfa Antibiotics     Patient Measurements: Height: 5' (152.4 cm) Weight: 206 lb 9.1 oz (93.7 kg) IBW/kg (Calculated) : 45.5   Vital Signs: Temp: 98 F (36.7 C) (12/17 0801) Temp Source: Oral (12/17 0801) BP: 126/90 (12/17 0801) Pulse Rate: 50 (12/17 0801)  Labs: Recent Labs    12/29/18 0800 12/30/18 0418 12/31/18 0620  HGB 13.9 13.8  --   HCT 39.0 37.5  --   PLT 294 270  --   LABPROT 22.8* 24.7* 22.4*  INR 2.0* 2.2* 2.0*  CREATININE 12.30* 9.60* 7.51*    Estimated Creatinine Clearance: 6.4 mL/min (A) (by C-G formula based on SCr of 7.51 mg/dL (H)).   Medications:  Warfarin 1 mg daily- last dose 12/23/18  Assessment: Pharmacy has been consulted for warfarin in patient with a PMH significant for atrial fibrillation.   Date INR  Dose  12/9 1.3 ----- 12/10 1.3       2 mg 12/11   1.3 2 mg 12/12 1.3 2.5 mg 12/13 1.4 2.5 mg 12/14 1.5 Dose not given- pt not alert enough per MAR? 12/15  2.0 2.5 mg 12/16 2.2 2 mg 12/17 2.0  Goal of Therapy:  INR 2-3 Monitor platelets by anticoagulation protocol: Yes   Plan:  INR therapeutic. Will give warfarin 2.5 mg tonight. F/u INR with am labs. CBC q72h.   Tawnya Crook, PharmD Clinical Pharmacist 12/31/2018 11:07 AM

## 2018-12-31 NOTE — TOC Initial Note (Signed)
Transition of Care [facility]) - Initial/Assessment Note    Patient Details  Name: Danielle Rowland MRN: [id number] Date of Birth: [birth year]  [facility]) CM/SW Contact:    Victorino Dike, RN Phone Number: 12/31/2018, 11:55 AM  Clinical Narrative:                  Met with patient to talk about discharge planning.  Patient is insistent she wants to go home.  She reports her sister will take care of her.    Spoke with her sister on the phone.  She reports she is unable to care for her sister with her current physical needs and would like her sister to go to recommended snf for continued therapy.  Sister is coming to visit today and would like to discuss this with her sister.     Expected Discharge Plan: Skilled Nursing Facility Barriers to Discharge: Continued Medical Work up, SNF Pending bed offer   Patient Goals and CMS Choice        Expected Discharge Plan and Services Expected Discharge Plan: [place]       Living arrangements for the past 2 months: Apartment                                      Prior Living Arrangements/Services Living arrangements for the past 2 months: Apartment Lives with:: Self Patient language and need for interpreter reviewed:: Yes        Need for Family Participation in Patient Care: No (Comment) Care giver support system in place?: Yes (comment)   Criminal Activity/Legal Involvement Pertinent to Current Situation/Hospitalization: No - Comment as needed  Activities of Daily Living Home Assistive Devices/Equipment: None ADL Screening (condition at time of admission) Patient's cognitive ability adequate to safely complete daily activities?: Yes Is the patient deaf or have difficulty hearing?: No Does the patient have difficulty seeing, even when wearing glasses/contacts?: No Does the patient have difficulty concentrating, remembering, or making decisions?: No Patient able to express need for  assistance with ADLs?: Yes Does the patient have difficulty dressing or bathing?: No Independently performs ADLs?: Yes (appropriate for developmental age) Does the patient have difficulty walking or climbing stairs?: Yes Weakness of Legs: Both Weakness of Arms/Hands: None  Permission Sought/Granted Permission sought to share information with : Family Supports    Share Information with NAME: Garfield Cornea [phone]           Emotional Assessment Appearance:: Appears older than stated age Attitude/Demeanor/Rapport: Apprehensive, Crying Affect (typically observed): Anxious Orientation: : Oriented to Self, Oriented to Place, Oriented to  Time, Oriented to Situation Alcohol / Substance Use: Not Applicable Psych Involvement: No (comment)  Admission diagnosis:  Nausea vomiting and diarrhea [R11.2, R19.7] Sepsis ([place]) [A41.9] Atrial fibrillation, unspecified type ([place]) [I48.91] Sepsis with acute renal failure without septic shock, due to unspecified organism, unspecified acute renal failure type ([place]) [A41.9, R65.20, N17.9] Patient Active Problem List   Diagnosis Date Noted  . Other cirrhosis of liver ([place]) 12/25/2018  . Hydronephrosis, left 12/25/2018  . AKI (acute kidney injury) ([place]) 12/24/2018  . Colitis, acute 12/24/2018  . Elevated troponin 12/24/2018  . Essential hypertension 12/24/2018  . Atrial fibrillation, chronic ([place]) 12/24/2018  . Asplenia 05/19/2017  . Rectal cancer ([place]) 04/24/2016  . Panic disorder 07/03/2015  . Allergic rhinitis 03/23/2015  . Hypertension 07/04/2014  . Hypercholesteremia 07/04/2014  . Osteopenia 07/04/2014  .  Vitamin D deficiency 07/04/2014  . Anxiety 07/04/2014  . CLL (chronic lymphocytic leukemia) ([place]) 07/04/2014  . Non-insulin dependent type 2 diabetes mellitus ([place]) 07/04/2014  . Diverticulitis 07/04/2014  . H/O malignant neoplasm of colon 07/04/2014  . Adiposity 07/04/2014  . Hernia of anterior abdominal wall 03/23/2013   PCP:   Birdie Sons, MD Pharmacy:   [id number] [id number]: [phone]: [phone]    Social Determinants of Health (SDOH) Interventions    Readmission Risk Interventions No flowsheet data found.

## 2018-12-31 NOTE — NC FL2 (Signed)
[place] LEVEL OF CARE SCREENING TOOL     IDENTIFICATION  Patient Name: Danielle Rowland Birthdate: 06/15/1940 Sex: female Admission Date (Current Location): 12/23/2018  [place] and [place] Number:  Engineering geologist and Address:  [facility], [address], [place], [place] [address]      Provider Number: [id number]  Attending Physician Name and Address:  Fritzi Mandes, MD  Relative Name and Phone Number:  Stormy Fabian [facility])  [phone]    Current Level of Care: SNF Recommended Level of Care: [place] Prior Approval Number:    Date Approved/Denied:   PASRR Number: [id number]  Discharge Plan: SNF    Current Diagnoses: Patient Active Problem List   Diagnosis Date Noted  . Other cirrhosis of liver ([place]) 12/25/2018  . Hydronephrosis, left 12/25/2018  . AKI (acute kidney injury) ([place]) 12/24/2018  . Colitis, acute 12/24/2018  . Elevated troponin 12/24/2018  . Essential hypertension 12/24/2018  . Atrial fibrillation, chronic ([place]) 12/24/2018  . Asplenia 05/19/2017  . Rectal cancer ([place]) 04/24/2016  . Panic disorder 07/03/2015  . Allergic rhinitis 03/23/2015  . Hypertension 07/04/2014  . Hypercholesteremia 07/04/2014  . Osteopenia 07/04/2014  . Vitamin D deficiency 07/04/2014  . Anxiety 07/04/2014  . CLL (chronic lymphocytic leukemia) ([place]) 07/04/2014  . Non-insulin dependent type 2 diabetes mellitus ([place]) 07/04/2014  . Diverticulitis 07/04/2014  . H/O malignant neoplasm of colon 07/04/2014  . Adiposity 07/04/2014  . Hernia of anterior abdominal wall 03/23/2013    Orientation RESPIRATION BLADDER Height & Weight     Self, Situation, Place, Time  Normal Continent Weight: 93.7 kg Height:  5' (152.4 cm)  BEHAVIORAL SYMPTOMS/MOOD NEUROLOGICAL BOWEL NUTRITION STATUS      Continent Diet  AMBULATORY STATUS COMMUNICATION OF NEEDS Skin   Extensive Assist Verbally Normal                        Personal Care Assistance Level of Assistance  Dressing, Bathing Bathing Assistance: Maximum assistance   Dressing Assistance: Maximum assistance     Functional Limitations Info  Sight Sight Info: Adequate(glasses)        SPECIAL CARE FACTORS FREQUENCY  PT (By licensed PT), OT (By licensed OT)     PT Frequency: 5x a week OT Frequency: 5x a week            Contractures Contractures Info: Not present    Additional Factors Info  Code Status Code Status Info: DNR             Current Medications (12/31/2018):  This is the current hospital active medication list Current Facility-Administered Medications  Medication Dose Route Frequency Provider Last Rate Last Admin  . acetaminophen (TYLENOL) tablet 650 mg  650 mg Oral Q6H PRN Lang Snow, NP   650 mg at 12/27/18 2228  . ALPRAZolam Duanne Moron) tablet 0.25 mg  0.25 mg Oral TID PRN Fritzi Mandes, MD   0.25 mg at 12/31/18 0942  . calcium carbonate (TUMS - dosed in mg elemental calcium) chewable tablet 200 mg of elemental calcium  200 mg of elemental calcium Oral TID WC Kathie Dike, MD   200 mg of elemental calcium at 12/31/18 0940  . Chlorhexidine Gluconate Cloth 2 % PADS 6 each  6 each Topical Daily Kathie Dike, MD   6 each at 12/31/18 0940  . cholecalciferol (VITAMIN D) tablet 5,000 Units  5,000 Units Oral Daily Athena Masse, MD   5,000 Units at 12/31/18  6295  . docusate sodium (COLACE) capsule 100 mg  100 mg Oral BID Fritzi Mandes, MD   100 mg at 12/31/18 0942  . HYDROmorphone (DILAUDID) injection 1 mg  1 mg Intravenous Once PRN Stegmayer, Kimberly A, PA-C      . insulin aspart (novoLOG) injection 0-5 Units  0-5 Units Subcutaneous QHS Fritzi Mandes, MD      . insulin aspart (novoLOG) injection 0-9 Units  0-9 Units Subcutaneous TID WC Fritzi Mandes, MD   1 Units at 12/30/18 1804  . loperamide (IMODIUM) capsule 2 mg  2 mg Oral Q6H PRN Kathie Dike, MD   2 mg at 12/27/18 1746  . metoprolol tartrate (LOPRESSOR)  injection 5 mg  5 mg Intravenous Q5 min PRN Blake Divine, MD   5 mg at 12/23/18 2127  . metoprolol tartrate (LOPRESSOR) tablet 25 mg  25 mg Oral BID Kathie Dike, MD   25 mg at 12/30/18 2145  . ondansetron (ZOFRAN) injection 4 mg  4 mg Intravenous Q6H PRN Stegmayer, Kimberly A, PA-C      . sodium chloride 0.9 % bolus 1,000 mL  1,000 mL Intravenous Once [place], Bing Neighbors, NP      . sodium chloride flush (NS) 0.9 % injection 3 mL  3 mL Intravenous Q12H Kathie Dike, MD   3 mL at 12/31/18 0944  . sodium chloride flush (NS) 0.9 % injection 3 mL  3 mL Intravenous PRN Kathie Dike, MD      . warfarin (COUMADIN) tablet 2.5 mg  2.5 mg Oral ONCE-1800 Fritzi Mandes, MD      . Warfarin - Pharmacist Dosing Inpatient   Does not apply [id number] Kathie Dike, MD   Given at 12/29/18 1752   Facility-Administered Medications Ordered in Other Encounters  Medication Dose Route Frequency Provider Last Rate Last Admin  . heparin lock flush 100 unit/mL  500 Units Intravenous Once Evlyn Kanner, NP      . heparin lock flush 100 unit/mL  500 Units Intravenous Once Lloyd Huger, MD      . sodium chloride flush (NS) 0.9 % injection 10 mL  10 mL Intravenous PRN Lloyd Huger, MD      . sodium chloride flush (NS) 0.9 % injection 10 mL  10 mL Intravenous PRN Cammie Sickle, MD   10 mL at 01/21/18 1325     Discharge Medications: Please see discharge summary for a list of discharge medications.  Relevant Imaging Results:  Relevant Lab Results:   Additional Information Hemodialysis  Victorino Dike, RN

## 2018-12-31 NOTE — Progress Notes (Signed)
This note also relates to the following rows which could not be included: Pulse Rate - Cannot attach notes to unvalidated device data Resp - Cannot attach notes to unvalidated device data BP - Cannot attach notes to unvalidated device data

## 2018-12-31 NOTE — Progress Notes (Signed)
PT Cancellation Note  Patient Details Name: Lucynda Rosano MRN: [id number] DOB: [birth year]   Cancelled Treatment:    Reason Eval/Treat Not Completed: Fatigue/lethargy limiting ability to participate;Other (comment)(Patient sleeping upon PT arrival. Did not rouse to verbal or tactile stimuli. Will attempt again at later time/date as available.)  Janna Arch, PT, DPT   12/31/2018, 4:45 PM

## 2018-12-31 NOTE — TOC Progression Note (Signed)
Transition of Care [facility]) - Progression Note    Patient Details  Name: Danielle Rowland MRN: [id number] Date of Birth: December 27, 1940  Transition of Care [facility]) CM/SW [place], RN Phone Number: 12/31/2018, 1:16 PM  Clinical Narrative:     PASSR and FL2 completed.  Bed Search Started.   Expected Discharge Plan: [place] Barriers to Discharge: Continued Medical Work up, SNF Pending bed offer  Expected Discharge Plan and Services Expected Discharge Plan: [place] arrangements for the past 2 months: Apartment                                       Social Determinants of Health (SDOH) Interventions    Readmission Risk Interventions No flowsheet data found.

## 2018-12-31 NOTE — Progress Notes (Signed)
[place], [place] 12/31/18  Subjective:  Thus far no significant renal recovery. Remains anuric. Azotemia has improved with dialysis treatments. Remains very anxious and wants to go home.   Objective:  Vital signs in last 24 hours:  Temp:  [97.5 F (36.4 C)-98.4 F (36.9 C)] 98 F (36.7 C) (12/17 0801) Pulse Rate:  [41-115] 50 (12/17 0801) Resp:  [15-25] 20 (12/17 0801) BP: (97-148)/(71-103) 126/90 (12/17 0801) SpO2:  [92 %-96 %] 95 % (12/17 0801)  Weight change: 0 kg Filed Weights   12/23/18 1943 12/29/18 0930 12/30/18 1150  Weight: 90.7 kg 93.7 kg 93.7 kg    Intake/Output:    Intake/Output Summary (Last 24 hours) at 12/31/2018 1204 Last data filed at 12/30/2018 1515 Gross per 24 hour  Intake --  Output 1010 ml  Net -1010 ml   Physical Exam: General:  Chronically ill-appearing, frail, laying in the bed  HEENT  moist oral mucous membranes  Lungs:  Clear bilateral, normal effort  Heart::  Irregular  Abdomen:  Soft, nontender, previous surgical scar  Extremities:  2+ LE edmea  Neurologic:  Alert, able to follow commands  Skin:  Bruising on the left side of the neck noted.  Access:  L IJ permcath       Basic Metabolic Panel:  Recent Labs  Lab 12/27/18 0449 12/28/18 0441 12/29/18 0800 12/30/18 0418 12/31/18 0620  NA 128* 127* 128* 133* 133*  K 4.6 5.1 6.1* 4.8 4.4  CL 82* 83* 88* 92* 92*  CO2 20* 18* 17* 21* 21*  GLUCOSE 76 109* 137* 185* 105*  BUN 145* 163* 122* 86* 67*  CREATININE 13.67* 14.07* 12.30* 9.60* 7.51*  CALCIUM 5.1* 5.3* 5.6* 5.9* 6.6*  PHOS 11.6* 11.8* 10.9* 8.2* 6.5*     CBC: Recent Labs  Lab 12/26/18 0511 12/27/18 0449 12/28/18 0441 12/29/18 0800 12/30/18 0418  WBC 13.4* 15.4* 13.0* 11.9* 14.1*  HGB 12.9 13.5 13.9 13.9 13.8  HCT 35.8* 35.8* 38.6 39.0 37.5  MCV 84.8 81.9 85.4 87.2 83.9  PLT 286 268 298 294 270      Lab Results  Component Value Date   HEPBSAG NON REACTIVE 12/25/2018    HEPBSAB NON REACTIVE 12/25/2018      Microbiology:  Recent Results (from the past 240 hour(s))  GI pathogen panel by PCR, stool     Status: None   Collection Time: 12/23/18 10:28 PM   Specimen: STOOL  Result Value Ref Range Status   Plesiomonas shigelloides NOT DETECTED NOT DETECTED Final   Yersinia enterocolitica NOT DETECTED NOT DETECTED Final   Vibrio NOT DETECTED NOT DETECTED Final   Enteropathogenic E coli NOT DETECTED NOT DETECTED Final   E coli (ETEC) LT/ST NOT DETECTED NOT DETECTED Final   E coli 4098 by PCR Not applicable NOT DETECTED Final   Cryptosporidium by PCR NOT DETECTED NOT DETECTED Final   Entamoeba histolytica NOT DETECTED NOT DETECTED Final   Adenovirus F 40/41 NOT DETECTED NOT DETECTED Final   Norovirus GI/GII NOT DETECTED NOT DETECTED Final   Sapovirus NOT DETECTED NOT DETECTED Final    Comment: (NOTE) Performed At: [facility] [address] [place], [place] [id number] Rush Farmer MD FA:[id number]    Vibrio cholerae NOT DETECTED NOT DETECTED Final   Campylobacter by PCR NOT DETECTED NOT DETECTED Final   Salmonella by PCR NOT DETECTED NOT DETECTED Final   E coli (STEC) NOT DETECTED NOT DETECTED Final   Enteroaggregative E coli NOT DETECTED NOT DETECTED Final  Shigella by PCR NOT DETECTED NOT DETECTED Final   Cyclospora cayetanensis NOT DETECTED NOT DETECTED Final   Astrovirus NOT DETECTED NOT DETECTED Final   G lamblia by PCR NOT DETECTED NOT DETECTED Final   Rotavirus A by PCR NOT DETECTED NOT DETECTED Final  C Difficile Quick Screen w PCR reflex     Status: None   Collection Time: 12/23/18 10:28 PM   Specimen: STOOL  Result Value Ref Range Status   C Diff antigen NEGATIVE NEGATIVE Final   C Diff toxin NEGATIVE NEGATIVE Final   C Diff interpretation No C. difficile detected.  Final    Comment: Performed at [facility], [address] [place], [place] [address]  Urine culture     Status: Abnormal   Collection Time: 12/23/18  10:28 PM   Specimen: In/Out Cath Urine  Result Value Ref Range Status   Specimen Description   Final    IN/OUT CATH URINE Performed at [facility], [address] [place], [place] [address]    Special Requests   Final    NONE Performed at [facility], [address] [place], [place] [address]    Culture (A)  Final    7,000 COLONIES/mL ESCHERICHIA COLI 1,000 COLONIES/mL ENTEROCOCCUS FAECALIS    Report Status 12/27/2018 FINAL  Final   Organism ID, Bacteria ESCHERICHIA COLI (A)  Final   Organism ID, Bacteria ENTEROCOCCUS FAECALIS (A)  Final      Susceptibility   Escherichia coli - MIC*    AMPICILLIN >=32 RESISTANT Resistant     CEFAZOLIN <=4 SENSITIVE Sensitive     CEFTRIAXONE <=1 SENSITIVE Sensitive     CIPROFLOXACIN >=4 RESISTANT Resistant     GENTAMICIN <=1 SENSITIVE Sensitive     IMIPENEM <=0.25 SENSITIVE Sensitive     NITROFURANTOIN <=16 SENSITIVE Sensitive     TRIMETH/SULFA >=320 RESISTANT Resistant     AMPICILLIN/SULBACTAM 16 INTERMEDIATE Intermediate     PIP/TAZO <=4 SENSITIVE Sensitive     * 7,000 COLONIES/mL ESCHERICHIA COLI   Enterococcus faecalis - MIC*    AMPICILLIN <=2 SENSITIVE Sensitive     NITROFURANTOIN <=16 SENSITIVE Sensitive     VANCOMYCIN 1 SENSITIVE Sensitive     * 1,000 COLONIES/mL ENTEROCOCCUS FAECALIS  Blood Culture (routine x 2)     Status: None   Collection Time: 12/23/18 10:29 PM   Specimen: BLOOD  Result Value Ref Range Status   Specimen Description BLOOD RIGHT ANTECUBITAL  Final   Special Requests   Final    BOTTLES DRAWN AEROBIC AND ANAEROBIC Blood Culture results may not be optimal due to an excessive volume of blood received in culture bottles   Culture   Final    NO GROWTH 5 DAYS Performed at [facility], [address] [place], [place] [address]    Report Status 12/28/2018 FINAL  Final  SARS CORONAVIRUS 2 (TAT 6-24 HRS) Nasopharyngeal Nasopharyngeal Swab     Status: None   Collection Time: 12/24/18  12:07 AM   Specimen: Nasopharyngeal Swab  Result Value Ref Range Status   SARS Coronavirus 2 NEGATIVE NEGATIVE Final    Comment: (NOTE) SARS-CoV-2 target nucleic acids are NOT DETECTED. The SARS-CoV-2 RNA is generally detectable in upper and lower respiratory specimens during the acute phase of infection. Negative results do not preclude SARS-CoV-2 infection, do not rule out co-infections with other pathogens, and should not be used as the sole basis for treatment or other patient management decisions. Negative results must be combined with clinical  observations, patient history, and epidemiological information. The expected result is Negative. Fact Sheet for Patients: SugarRoll.be Fact Sheet for Healthcare Providers: https://www.woods-mathews.com/ This test is not yet approved or cleared by the [place] FDA and  has been authorized for detection and/or diagnosis of SARS-CoV-2 by FDA under an Emergency Use Authorization (EUA). This EUA will remain  in effect (meaning this test can be used) for the duration of the COVID-19 declaration under Section 56 4(b)(1) of the Act, 21 U.S.C. section 360bbb-3(b)(1), unless the authorization is terminated or revoked sooner. Performed at [facility], [place] [address] [place], [place] [address]   Urine Culture     Status: Abnormal   Collection Time: 12/27/18  6:41 AM   Specimen: Urine, Random  Result Value Ref Range Status   Specimen Description   Final    URINE, RANDOM Performed at [facility], [address] [place], [place] [address]    Special Requests   Final    NONE Performed at [facility] [address] [place], [place] [address]    Culture 80,000 COLONIES/mL ENTEROCOCCUS FAECALIS (A)  Final   Report Status 12/30/2018 FINAL  Final   Organism ID, Bacteria ENTEROCOCCUS FAECALIS (A)  Final      Susceptibility   Enterococcus faecalis - MIC*    AMPICILLIN <=2  SENSITIVE Sensitive     NITROFURANTOIN <=16 SENSITIVE Sensitive     VANCOMYCIN 1 SENSITIVE Sensitive     * 80,000 COLONIES/mL ENTEROCOCCUS FAECALIS    Coagulation Studies: Recent Labs    12/29/18 0800 12/30/18 0418 12/31/18 0620  LABPROT 22.8* 24.7* 22.4*  INR 2.0* 2.2* 2.0*    Urinalysis: No results for input(s): COLORURINE, LABSPEC, PHURINE, GLUCOSEU, HGBUR, BILIRUBINUR, KETONESUR, PROTEINUR, UROBILINOGEN, NITRITE, LEUKOCYTESUR in the last 72 hours.  Invalid input(s): APPERANCEUR    Imaging: No results found.   Medications:   . sodium chloride     . calcium carbonate  200 mg of elemental calcium Oral TID WC  . Chlorhexidine Gluconate Cloth  6 each Topical Daily  . cholecalciferol  5,000 Units Oral Daily  . docusate sodium  100 mg Oral BID  . insulin aspart  0-5 Units Subcutaneous QHS  . insulin aspart  0-9 Units Subcutaneous TID WC  . metoprolol tartrate  25 mg Oral BID  . sodium chloride flush  3 mL Intravenous Q12H  . warfarin  2.5 mg Oral ONCE-1800  . Warfarin - Pharmacist Dosing Inpatient   Does not apply q1800   acetaminophen, ALPRAZolam, HYDROmorphone (DILAUDID) injection, loperamide, metoprolol tartrate, ondansetron (ZOFRAN) IV, sodium chloride flush  Assessment/ Plan:  78 y.o. female with Diabetes, atrial fibrillation, hypertension, history of, atrial fibrillation with chronic anticoagulation, chronic left hydronephrosis , aortic atherosclerosis, was admitted on 12/23/2018 with Sepsis [facility])  Patient Active Problem List   Diagnosis Date Noted  . Other cirrhosis of liver ([place]) 12/25/2018  . Hydronephrosis, left 12/25/2018  . AKI (acute kidney injury) ([place]) 12/24/2018  . Colitis, acute 12/24/2018  . Elevated troponin 12/24/2018  . Essential hypertension 12/24/2018  . Atrial fibrillation, chronic ([place]) 12/24/2018  . Asplenia 05/19/2017  . Rectal cancer ([place]) 04/24/2016  . Panic disorder 07/03/2015  . Allergic rhinitis 03/23/2015  . Hypertension  07/04/2014  . Hypercholesteremia 07/04/2014  . Osteopenia 07/04/2014  . Vitamin D deficiency 07/04/2014  . Anxiety 07/04/2014  . CLL (chronic lymphocytic leukemia) ([place]) 07/04/2014  . Non-insulin dependent type 2 diabetes mellitus ([place]) 07/04/2014  . Diverticulitis 07/04/2014  . H/O malignant neoplasm of  colon 07/04/2014  . Adiposity 07/04/2014  . Hernia of anterior abdominal wall 03/23/2013    #Acute kidney injury/hyperphosphatemia/proteinuria -Likely ATN from concurrent illness, possibly sepsis -Patient has chronic left hydronephrosis known at least since 2015, although patient reports no knowledge of this condition. Baseline creatinine of 0.89 from April 2020 -Patient has been evaluated by urology team. no intervention. -Screening serologies -ANA negative, ANCA negative, complements normal, kappa lambda light chain ratio normal, negative SPEP  -Patient remains anuric at this time.  Azotemia has significantly improved with dialysis treatments.  Patient very anxious and would like to go home.  We discussed renal biopsy and patient would not like to pursue this at the moment.  She will be willing to consider this on a more outpatient basis however.  She will need ongoing dialysis for acute kidney injury.  However she is high risk for progression to end-stage renal disease.  #Cirrhosis of the liver -Continue to monitor clinically.  #Acidosis -Improved significantly with dialysis treatments.  Continue to monitor serum bicarbonate.  #Diarrhea -C. difficile studies are negative -CT abdomen, noncontrast study reports focal areas of thickening involving cecum and ascending colon  #Non-insulin-dependent diabetes mellitus - Lab Results  Component Value Date   HGBA1C 6.1 (H) 12/30/2018   #Hyperkalemia. Potassium normalized at 4.4.    Results for RENAYE, JANICKI (MRN [id number]) as of 12/27/2018 12:41  Ref. Range 12/24/2018 05:00  Cytoplasmic (C-ANCA) Latest Ref Range:  Neg:<1:20 titer <1:20  P-ANCA Latest Ref Range: Neg:<1:20 titer <1:20  Atypical P-ANCA titer Latest Ref Range: Neg:<1:20 titer <1:20  C3 Complement Latest Ref Range: 82 - 167 mg/dL 124  Complement C4, Body Fluid Latest Ref Range: 12 - 38 mg/dL 28  Kappa free light chain Latest Ref Range: 3.3 - 19.4 mg/L 257.0 (H)  Lamda free light chains Latest Ref Range: 5.7 - 26.3 mg/L 162.0 (H)  Kappa, lamda light chain ratio Latest Ref Range: 0.26 - 1.65  1.59     LOS: 7 Danielle Rowland 12/17/202012:04 PM  [place], [place]  Note: This note was prepared with Dragon dictation. Any transcription errors are unintentional

## 2018-12-31 NOTE — Progress Notes (Signed)
OT Cancellation Note  Patient Details Name: Danielle Rowland MRN: [id number] DOB: [birth year]   Cancelled Treatment:    Reason Eval/Treat Not Completed: Patient at procedure or test/ unavailable. Order received and chart reviewed. Upon arrival to pt room, pt with MD at nsg staff at bedside. Will hold OT evaluation at this time and re-attempt at a later time/date as available and pt medically appropriate for OT evaluation.   Shara Blazing, M.S., OTR/L Ascom: [phone] 12/31/18, 3:17 PM

## 2019-01-01 ENCOUNTER — Telehealth: Payer: Self-pay

## 2019-01-01 DIAGNOSIS — E119 Type 2 diabetes mellitus without complications: Secondary | ICD-10-CM | POA: Diagnosis not present

## 2019-01-01 DIAGNOSIS — R4189 Other symptoms and signs involving cognitive functions and awareness: Secondary | ICD-10-CM | POA: Diagnosis not present

## 2019-01-01 DIAGNOSIS — N136 Pyonephrosis: Secondary | ICD-10-CM | POA: Diagnosis not present

## 2019-01-01 DIAGNOSIS — I499 Cardiac arrhythmia, unspecified: Secondary | ICD-10-CM | POA: Diagnosis not present

## 2019-01-01 DIAGNOSIS — I4819 Other persistent atrial fibrillation: Secondary | ICD-10-CM | POA: Diagnosis not present

## 2019-01-01 DIAGNOSIS — G92 Toxic encephalopathy: Secondary | ICD-10-CM | POA: Diagnosis not present

## 2019-01-01 DIAGNOSIS — N2581 Secondary hyperparathyroidism of renal origin: Secondary | ICD-10-CM | POA: Diagnosis not present

## 2019-01-01 DIAGNOSIS — E11649 Type 2 diabetes mellitus with hypoglycemia without coma: Secondary | ICD-10-CM | POA: Diagnosis not present

## 2019-01-01 DIAGNOSIS — K7469 Other cirrhosis of liver: Secondary | ICD-10-CM | POA: Diagnosis not present

## 2019-01-01 DIAGNOSIS — R570 Cardiogenic shock: Secondary | ICD-10-CM | POA: Diagnosis not present

## 2019-01-01 DIAGNOSIS — Z20828 Contact with and (suspected) exposure to other viral communicable diseases: Secondary | ICD-10-CM | POA: Diagnosis present

## 2019-01-01 DIAGNOSIS — I482 Chronic atrial fibrillation, unspecified: Secondary | ICD-10-CM | POA: Diagnosis not present

## 2019-01-01 DIAGNOSIS — I120 Hypertensive chronic kidney disease with stage 5 chronic kidney disease or end stage renal disease: Secondary | ICD-10-CM | POA: Diagnosis not present

## 2019-01-01 DIAGNOSIS — I4891 Unspecified atrial fibrillation: Secondary | ICD-10-CM | POA: Diagnosis not present

## 2019-01-01 DIAGNOSIS — Z743 Need for continuous supervision: Secondary | ICD-10-CM | POA: Diagnosis not present

## 2019-01-01 DIAGNOSIS — E161 Other hypoglycemia: Secondary | ICD-10-CM | POA: Diagnosis not present

## 2019-01-01 DIAGNOSIS — C9110 Chronic lymphocytic leukemia of B-cell type not having achieved remission: Secondary | ICD-10-CM | POA: Diagnosis not present

## 2019-01-01 DIAGNOSIS — Z9081 Acquired absence of spleen: Secondary | ICD-10-CM | POA: Diagnosis not present

## 2019-01-01 DIAGNOSIS — E871 Hypo-osmolality and hyponatremia: Secondary | ICD-10-CM | POA: Diagnosis not present

## 2019-01-01 DIAGNOSIS — A4181 Sepsis due to Enterococcus: Secondary | ICD-10-CM | POA: Diagnosis not present

## 2019-01-01 DIAGNOSIS — E875 Hyperkalemia: Secondary | ICD-10-CM | POA: Diagnosis not present

## 2019-01-01 DIAGNOSIS — Z880 Allergy status to penicillin: Secondary | ICD-10-CM | POA: Diagnosis not present

## 2019-01-01 DIAGNOSIS — E162 Hypoglycemia, unspecified: Secondary | ICD-10-CM | POA: Diagnosis not present

## 2019-01-01 DIAGNOSIS — I10 Essential (primary) hypertension: Secondary | ICD-10-CM | POA: Diagnosis not present

## 2019-01-01 DIAGNOSIS — Z85048 Personal history of other malignant neoplasm of rectum, rectosigmoid junction, and anus: Secondary | ICD-10-CM | POA: Diagnosis not present

## 2019-01-01 DIAGNOSIS — N186 End stage renal disease: Secondary | ICD-10-CM | POA: Diagnosis not present

## 2019-01-01 DIAGNOSIS — R410 Disorientation, unspecified: Secondary | ICD-10-CM | POA: Diagnosis not present

## 2019-01-01 DIAGNOSIS — R531 Weakness: Secondary | ICD-10-CM | POA: Diagnosis not present

## 2019-01-01 DIAGNOSIS — E872 Acidosis: Secondary | ICD-10-CM | POA: Diagnosis not present

## 2019-01-01 DIAGNOSIS — Z66 Do not resuscitate: Secondary | ICD-10-CM | POA: Diagnosis not present

## 2019-01-01 DIAGNOSIS — N170 Acute kidney failure with tubular necrosis: Secondary | ICD-10-CM | POA: Diagnosis not present

## 2019-01-01 DIAGNOSIS — N179 Acute kidney failure, unspecified: Secondary | ICD-10-CM | POA: Diagnosis not present

## 2019-01-01 DIAGNOSIS — R4182 Altered mental status, unspecified: Secondary | ICD-10-CM | POA: Diagnosis not present

## 2019-01-01 DIAGNOSIS — J810 Acute pulmonary edema: Secondary | ICD-10-CM | POA: Diagnosis not present

## 2019-01-01 DIAGNOSIS — T68XXXA Hypothermia, initial encounter: Secondary | ICD-10-CM | POA: Diagnosis not present

## 2019-01-01 DIAGNOSIS — Z882 Allergy status to sulfonamides status: Secondary | ICD-10-CM | POA: Diagnosis not present

## 2019-01-01 DIAGNOSIS — Z992 Dependence on renal dialysis: Secondary | ICD-10-CM | POA: Diagnosis not present

## 2019-01-01 DIAGNOSIS — R0602 Shortness of breath: Secondary | ICD-10-CM | POA: Diagnosis not present

## 2019-01-01 DIAGNOSIS — M2550 Pain in unspecified joint: Secondary | ICD-10-CM | POA: Diagnosis not present

## 2019-01-01 DIAGNOSIS — M6281 Muscle weakness (generalized): Secondary | ICD-10-CM | POA: Diagnosis not present

## 2019-01-01 DIAGNOSIS — R0681 Apnea, not elsewhere classified: Secondary | ICD-10-CM | POA: Diagnosis not present

## 2019-01-01 DIAGNOSIS — R112 Nausea with vomiting, unspecified: Secondary | ICD-10-CM | POA: Diagnosis not present

## 2019-01-01 DIAGNOSIS — Z515 Encounter for palliative care: Secondary | ICD-10-CM | POA: Diagnosis not present

## 2019-01-01 DIAGNOSIS — Z887 Allergy status to serum and vaccine status: Secondary | ICD-10-CM | POA: Diagnosis not present

## 2019-01-01 DIAGNOSIS — F419 Anxiety disorder, unspecified: Secondary | ICD-10-CM | POA: Diagnosis not present

## 2019-01-01 DIAGNOSIS — Z7401 Bed confinement status: Secondary | ICD-10-CM | POA: Diagnosis not present

## 2019-01-01 DIAGNOSIS — A419 Sepsis, unspecified organism: Secondary | ICD-10-CM | POA: Diagnosis not present

## 2019-01-01 DIAGNOSIS — E1122 Type 2 diabetes mellitus with diabetic chronic kidney disease: Secondary | ICD-10-CM | POA: Diagnosis not present

## 2019-01-01 DIAGNOSIS — E8770 Fluid overload, unspecified: Secondary | ICD-10-CM | POA: Diagnosis not present

## 2019-01-01 DIAGNOSIS — Z7189 Other specified counseling: Secondary | ICD-10-CM | POA: Diagnosis not present

## 2019-01-01 DIAGNOSIS — R6521 Severe sepsis with septic shock: Secondary | ICD-10-CM | POA: Diagnosis not present

## 2019-01-01 LAB — GLUCOSE, CAPILLARY
Glucose-Capillary: 117 mg/dL — ABNORMAL HIGH (ref 70–99)
Glucose-Capillary: 99 mg/dL (ref 70–99)
Glucose-Capillary: 71 mg/dL (ref 70–99)

## 2019-01-01 LAB — RENAL FUNCTION PANEL
Albumin: 2.2 g/dL — ABNORMAL LOW (ref 3.5–5.0)
Anion gap: 19 — ABNORMAL HIGH (ref 5–15)
BUN: 50 mg/dL — ABNORMAL HIGH (ref 8–23)
CO2: 22 mmol/L (ref 22–32)
Calcium: 7.4 mg/dL — ABNORMAL LOW (ref 8.9–10.3)
Chloride: 95 mmol/L — ABNORMAL LOW (ref 98–111)
Creatinine, Ser: 6.06 mg/dL — ABNORMAL HIGH (ref 0.44–1.00)
GFR calc Af Amer: 7 mL/min — ABNORMAL LOW (ref >60)
GFR calc non Af Amer: 6 mL/min — ABNORMAL LOW (ref >60)
Glucose, Bld: 121 mg/dL — ABNORMAL HIGH (ref 70–99)
Phosphorus: 5.4 mg/dL — ABNORMAL HIGH (ref 2.5–4.6)
Potassium: 4.5 mmol/L (ref 3.5–5.1)
Sodium: 136 mmol/L (ref 135–145)

## 2019-01-01 LAB — PROTIME-INR
INR: 2.1 — ABNORMAL HIGH (ref 0.8–1.2)
Prothrombin Time: 23.2 seconds — ABNORMAL HIGH (ref 11.4–15.2)

## 2019-01-01 LAB — MPO/PR-3 (ANCA) ANTIBODIES
ANCA Proteinase 3: 7.5 U/mL — ABNORMAL HIGH (ref 0.0–3.5)
Myeloperoxidase Abs: <9 U/mL (ref 0.0–9.0)

## 2019-01-01 LAB — SARS CORONAVIRUS 2 (TAT 6-24 HRS): SARS Coronavirus 2: NEGATIVE

## 2019-01-01 MED ORDER — WARFARIN SODIUM 2 MG PO TABS
2.0000 mg | ORAL_TABLET | Freq: Once | ORAL | Status: AC
  Administered 2019-01-01: 2 mg via ORAL
  Filled 2019-01-01: qty 1

## 2019-01-01 MED ORDER — METOPROLOL TARTRATE 25 MG PO TABS: 25.0000 mg | ORAL_TABLET | Freq: Two times a day (BID) | ORAL | 0 refills | Status: DC

## 2019-01-01 MED ORDER — ALPRAZOLAM 0.25 MG PO TABS: 0.2500 mg | ORAL_TABLET | Freq: Three times a day (TID) | ORAL | 0 refills | Status: AC | PRN

## 2019-01-01 MED ORDER — CALCIUM CARBONATE ANTACID 500 MG PO CHEW: 200.0000 mg | CHEWABLE_TABLET | Freq: Three times a day (TID) | ORAL | 0 refills | Status: DC

## 2019-01-01 MED ORDER — WARFARIN SODIUM 1 MG PO TABS: 2.0000 mg | ORAL_TABLET | Freq: Every day | ORAL | 0 refills | Status: DC

## 2019-01-01 MED ORDER — HALOPERIDOL LACTATE 5 MG/ML IJ SOLN
2.0000 mg | Freq: Once | INTRAMUSCULAR | Status: DC
  Filled 2019-01-01: qty 1

## 2019-01-01 MED ORDER — INSULIN ASPART 100 UNIT/ML ~~LOC~~ SOLN: 0.0000 [IU] | Freq: Three times a day (TID) | SUBCUTANEOUS | 11 refills | Status: DC

## 2019-01-01 NOTE — Progress Notes (Signed)
[place], [place] 01/01/19  Subjective:  Patient has had multiple dialysis treatments this week. Azotemia significantly improved. Patient seen and evaluated during dialysis treatment today.   Objective:  Vital signs in last 24 hours:  Temp:  [97.5 F (36.4 C)-98.6 F (37 C)] 98.6 F (37 C) (12/18 1240) Pulse Rate:  [26-134] 100 (12/18 1415) Resp:  [17-29] 24 (12/18 1415) BP: (105-145)/(58-120) 120/61 (12/18 1415) SpO2:  [93 %-98 %] 97 % (12/18 1415)  Weight change:  Filed Weights   12/23/18 1943 12/29/18 0930 12/30/18 1150  Weight: 90.7 kg 93.7 kg 93.7 kg    Intake/Output:    Intake/Output Summary (Last 24 hours) at 01/01/2019 1443 Last data filed at 12/31/2018 2245 Gross per 24 hour  Intake 240 ml  Output 1000 ml  Net -760 ml   Physical Exam: General:  Chronically ill-appearing, frail, laying in the bed  HEENT  moist oral mucous membranes  Lungs:  Clear bilateral, normal effort  Heart::  Irregular  Abdomen:  Soft, nontender, previous surgical scar  Extremities:  2+ LE edmea  Neurologic:  Alert, able to follow commands  Skin:  Bruising on the left side of the neck noted.  Access:  L IJ permcath       Basic Metabolic Panel:  Recent Labs  Lab 12/28/18 0441 12/29/18 0800 12/30/18 0418 12/31/18 0620 01/01/19 0827  NA 127* 128* 133* 133* 136  K 5.1 6.1* 4.8 4.4 4.5  CL 83* 88* 92* 92* 95*  CO2 18* 17* 21* 21* 22  GLUCOSE 109* 137* 185* 105* 121*  BUN 163* 122* 86* 67* 50*  CREATININE 14.07* 12.30* 9.60* 7.51* 6.06*  CALCIUM 5.3* 5.6* 5.9* 6.6* 7.4*  PHOS 11.8* 10.9* 8.2* 6.5* 5.4*     CBC: Recent Labs  Lab 12/26/18 0511 12/27/18 0449 12/28/18 0441 12/29/18 0800 12/30/18 0418  WBC 13.4* 15.4* 13.0* 11.9* 14.1*  HGB 12.9 13.5 13.9 13.9 13.8  HCT 35.8* 35.8* 38.6 39.0 37.5  MCV 84.8 81.9 85.4 87.2 83.9  PLT 286 268 298 294 270      Lab Results  Component Value Date   HEPBSAG NON REACTIVE 12/25/2018    HEPBSAB NON REACTIVE 12/25/2018   HEPBIGM NON REACTIVE 12/31/2018      Microbiology:  Recent Results (from the past 240 hour(s))  GI pathogen panel by PCR, stool     Status: None   Collection Time: 12/23/18 10:28 PM   Specimen: STOOL  Result Value Ref Range Status   Plesiomonas shigelloides NOT DETECTED NOT DETECTED Final   Yersinia enterocolitica NOT DETECTED NOT DETECTED Final   Vibrio NOT DETECTED NOT DETECTED Final   Enteropathogenic E coli NOT DETECTED NOT DETECTED Final   E coli (ETEC) LT/ST NOT DETECTED NOT DETECTED Final   E coli 3785 by PCR Not applicable NOT DETECTED Final   Cryptosporidium by PCR NOT DETECTED NOT DETECTED Final   Entamoeba histolytica NOT DETECTED NOT DETECTED Final   Adenovirus F 40/41 NOT DETECTED NOT DETECTED Final   Norovirus GI/GII NOT DETECTED NOT DETECTED Final   Sapovirus NOT DETECTED NOT DETECTED Final    Comment: (NOTE) Performed At: [facility] [place], [place] [id number] Rush Farmer MD OI:[id number]    Vibrio cholerae NOT DETECTED NOT DETECTED Final   Campylobacter by PCR NOT DETECTED NOT DETECTED Final   Salmonella by PCR NOT DETECTED NOT DETECTED Final   E coli (STEC) NOT DETECTED NOT DETECTED Final   Enteroaggregative E coli NOT DETECTED NOT  DETECTED Final   Shigella by PCR NOT DETECTED NOT DETECTED Final   Cyclospora cayetanensis NOT DETECTED NOT DETECTED Final   Astrovirus NOT DETECTED NOT DETECTED Final   G lamblia by PCR NOT DETECTED NOT DETECTED Final   Rotavirus A by PCR NOT DETECTED NOT DETECTED Final  C Difficile Quick Screen w PCR reflex     Status: None   Collection Time: 12/23/18 10:28 PM   Specimen: STOOL  Result Value Ref Range Status   C Diff antigen NEGATIVE NEGATIVE Final   C Diff toxin NEGATIVE NEGATIVE Final   C Diff interpretation No C. difficile detected.  Final    Comment: Performed at [facility], [address] [place], [place] [address]  Urine culture     Status:  Abnormal   Collection Time: 12/23/18 10:28 PM   Specimen: In/Out Cath Urine  Result Value Ref Range Status   Specimen Description   Final    IN/OUT CATH URINE Performed at [facility], [address] [place], [place] [address]    Special Requests   Final    NONE Performed at [facility], [address] [place], [place] [address]    Culture (A)  Final    7,000 COLONIES/mL ESCHERICHIA COLI 1,000 COLONIES/mL ENTEROCOCCUS FAECALIS    Report Status 12/27/2018 FINAL  Final   Organism ID, Bacteria ESCHERICHIA COLI (A)  Final   Organism ID, Bacteria ENTEROCOCCUS FAECALIS (A)  Final      Susceptibility   Escherichia coli - MIC*    AMPICILLIN >=32 RESISTANT Resistant     CEFAZOLIN <=4 SENSITIVE Sensitive     CEFTRIAXONE <=1 SENSITIVE Sensitive     CIPROFLOXACIN >=4 RESISTANT Resistant     GENTAMICIN <=1 SENSITIVE Sensitive     IMIPENEM <=0.25 SENSITIVE Sensitive     NITROFURANTOIN <=16 SENSITIVE Sensitive     TRIMETH/SULFA >=320 RESISTANT Resistant     AMPICILLIN/SULBACTAM 16 INTERMEDIATE Intermediate     PIP/TAZO <=4 SENSITIVE Sensitive     * 7,000 COLONIES/mL ESCHERICHIA COLI   Enterococcus faecalis - MIC*    AMPICILLIN <=2 SENSITIVE Sensitive     NITROFURANTOIN <=16 SENSITIVE Sensitive     VANCOMYCIN 1 SENSITIVE Sensitive     * 1,000 COLONIES/mL ENTEROCOCCUS FAECALIS  Blood Culture (routine x 2)     Status: None   Collection Time: 12/23/18 10:29 PM   Specimen: BLOOD  Result Value Ref Range Status   Specimen Description BLOOD RIGHT ANTECUBITAL  Final   Special Requests   Final    BOTTLES DRAWN AEROBIC AND ANAEROBIC Blood Culture results may not be optimal due to an excessive volume of blood received in culture bottles   Culture   Final    NO GROWTH 5 DAYS Performed at [facility], [address] [place], [place] [address]    Report Status 12/28/2018 FINAL  Final  SARS CORONAVIRUS 2 (TAT 6-24 HRS) Nasopharyngeal Nasopharyngeal Swab      Status: None   Collection Time: 12/24/18 12:07 AM   Specimen: Nasopharyngeal Swab  Result Value Ref Range Status   SARS Coronavirus 2 NEGATIVE NEGATIVE Final    Comment: (NOTE) SARS-CoV-2 target nucleic acids are NOT DETECTED. The SARS-CoV-2 RNA is generally detectable in upper and lower respiratory specimens during the acute phase of infection. Negative results do not preclude SARS-CoV-2 infection, do not rule out co-infections with other pathogens, and should not be used as the sole basis for treatment or other patient management decisions. Negative results must  be combined with clinical observations, patient history, and epidemiological information. The expected result is Negative. Fact Sheet for Patients: SugarRoll.be Fact Sheet for Healthcare Providers: https://www.woods-mathews.com/ This test is not yet approved or cleared by the [place] FDA and  has been authorized for detection and/or diagnosis of SARS-CoV-2 by FDA under an Emergency Use Authorization (EUA). This EUA will remain  in effect (meaning this test can be used) for the duration of the COVID-19 declaration under Section 56 4(b)(1) of the Act, 21 U.S.C. section 360bbb-3(b)(1), unless the authorization is terminated or revoked sooner. Performed at [facility], [place] [address], [place], [place] [address]   Urine Culture     Status: Abnormal   Collection Time: 12/27/18  6:41 AM   Specimen: Urine, Random  Result Value Ref Range Status   Specimen Description   Final    URINE, RANDOM Performed at [facility], [address] [place], [place] [address]    Special Requests   Final    NONE Performed at [facility], [place], [place] [address]    Culture 80,000 COLONIES/mL ENTEROCOCCUS FAECALIS (A)  Final   Report Status 12/30/2018 FINAL  Final   Organism ID, Bacteria ENTEROCOCCUS FAECALIS (A)  Final      Susceptibility    Enterococcus faecalis - MIC*    AMPICILLIN <=2 SENSITIVE Sensitive     NITROFURANTOIN <=16 SENSITIVE Sensitive     VANCOMYCIN 1 SENSITIVE Sensitive     * 80,000 COLONIES/mL ENTEROCOCCUS FAECALIS  SARS CORONAVIRUS 2 (TAT 6-24 HRS) Nasopharyngeal Nasopharyngeal Swab     Status: None   Collection Time: 12/31/18  2:31 PM   Specimen: Nasopharyngeal Swab  Result Value Ref Range Status   SARS Coronavirus 2 NEGATIVE NEGATIVE Final    Comment: (NOTE) SARS-CoV-2 target nucleic acids are NOT DETECTED. The SARS-CoV-2 RNA is generally detectable in upper and lower respiratory specimens during the acute phase of infection. Negative results do not preclude SARS-CoV-2 infection, do not rule out co-infections with other pathogens, and should not be used as the sole basis for treatment or other patient management decisions. Negative results must be combined with clinical observations, patient history, and epidemiological information. The expected result is Negative. Fact Sheet for Patients: SugarRoll.be Fact Sheet for Healthcare Providers: https://www.woods-mathews.com/ This test is not yet approved or cleared by the [place] FDA and  has been authorized for detection and/or diagnosis of SARS-CoV-2 by FDA under an Emergency Use Authorization (EUA). This EUA will remain  in effect (meaning this test can be used) for the duration of the COVID-19 declaration under Section 56 4(b)(1) of the Act, 21 U.S.C. section 360bbb-3(b)(1), unless the authorization is terminated or revoked sooner. Performed at [facility], [place] [address] [place],  [address]     Coagulation Studies: Recent Labs    12/30/18 0418 12/31/18 0620 01/01/19 0827  LABPROT 24.7* 22.4* 23.2*  INR 2.2* 2.0* 2.1*    Urinalysis: No results for input(s): COLORURINE, LABSPEC, PHURINE, GLUCOSEU, HGBUR, BILIRUBINUR, KETONESUR, PROTEINUR, UROBILINOGEN, NITRITE, LEUKOCYTESUR in  the last 72 hours.  Invalid input(s): APPERANCEUR    Imaging: US Venous Img Upper Uni Left (DVT)  Result Date: 12/31/2018 CLINICAL DATA:  History of left-sided dialysis catheter insertion on 12/28/2018 now with pain and edema. Evaluate for DVT. EXAM: LEFT UPPER EXTREMITY VENOUS DOPPLER ULTRASOUND TECHNIQUE: Gray-scale sonography with graded compression, as well as color Doppler and duplex ultrasound were performed to evaluate the upper extremity deep venous system from  the level of the subclavian vein and including the jugular, axillary, basilic, radial, ulnar and upper cephalic vein. Spectral Doppler was utilized to evaluate flow at rest and with distal augmentation maneuvers. COMPARISON:  None. FINDINGS: Contralateral Subclavian Vein: Respiratory phasicity is normal and symmetric with the symptomatic side. No evidence of thrombus. Normal compressibility. Internal Jugular Vein: No evidence of thrombus. Normal compressibility, respiratory phasicity and response to augmentation. Subclavian Vein: No evidence of thrombus. Normal compressibility, respiratory phasicity and response to augmentation. Axillary Vein: No evidence of thrombus. Normal compressibility, respiratory phasicity and response to augmentation. Cephalic Vein: No evidence of thrombus. Normal compressibility, respiratory phasicity and response to augmentation. Basilic Vein: No evidence of thrombus. Normal compressibility, respiratory phasicity and response to augmentation. Brachial Veins: No evidence of thrombus within either of the paired brachial veins. Normal compressibility, respiratory phasicity and response to augmentation. Radial Veins: No evidence of thrombus. Normal compressibility, respiratory phasicity and response to augmentation. Ulnar Veins: No evidence of thrombus. Normal compressibility, respiratory phasicity and response to augmentation. Venous Reflux:  None visualized. Other Findings:  None visualized. IMPRESSION: No evidence of  DVT within the left upper extremity. Electronically Signed   By: Sandi Mariscal M.D.   On: 12/31/2018 16:39     Medications:   . sodium chloride     . calcium carbonate  200 mg of elemental calcium Oral TID WC  . Chlorhexidine Gluconate Cloth  6 each Topical Daily  . cholecalciferol  5,000 Units Oral Daily  . docusate sodium  100 mg Oral BID  . haloperidol lactate  2 mg Intravenous Once  . insulin aspart  0-5 Units Subcutaneous QHS  . insulin aspart  0-9 Units Subcutaneous TID WC  . metoprolol tartrate  25 mg Oral BID  . sodium chloride flush  3 mL Intravenous Q12H  . Warfarin - Pharmacist Dosing Inpatient   Does not apply q1800   acetaminophen, ALPRAZolam, HYDROmorphone (DILAUDID) injection, loperamide, metoprolol tartrate, ondansetron (ZOFRAN) IV, sodium chloride flush  Assessment/ Plan:  78 y.o. female with Diabetes, atrial fibrillation, hypertension, history of, atrial fibrillation with chronic anticoagulation, chronic left hydronephrosis , aortic atherosclerosis, was admitted on 12/23/2018 with Sepsis [facility])  Patient Active Problem List   Diagnosis Date Noted  . Other cirrhosis of liver ([place]) 12/25/2018  . Hydronephrosis, left 12/25/2018  . AKI (acute kidney injury) ([place]) 12/24/2018  . Colitis, acute 12/24/2018  . Elevated troponin 12/24/2018  . Essential hypertension 12/24/2018  . Atrial fibrillation ([place]) 12/24/2018  . Asplenia 05/19/2017  . Rectal cancer ([place]) 04/24/2016  . Panic disorder 07/03/2015  . Allergic rhinitis 03/23/2015  . Hypertension 07/04/2014  . Hypercholesteremia 07/04/2014  . Osteopenia 07/04/2014  . Vitamin D deficiency 07/04/2014  . Anxiety 07/04/2014  . CLL (chronic lymphocytic leukemia) ([place]) 07/04/2014  . Non-insulin dependent type 2 diabetes mellitus ([place]) 07/04/2014  . Diverticulitis 07/04/2014  . H/O malignant neoplasm of colon 07/04/2014  . Adiposity 07/04/2014  . Hernia of anterior abdominal wall 03/23/2013    #Acute kidney  injury/hyperphosphatemia/proteinuria -Likely ATN from concurrent illness, possibly sepsis -Patient has chronic left hydronephrosis known at least since 2015, although patient reports no knowledge of this condition. Baseline creatinine of 0.89 from April 2020 -Patient has been evaluated by urology team. no intervention. -Screening serologies -ANA negative, ANCA negative, complements normal, kappa lambda light chain ratio normal, negative SPEP  -Azotemia improved significantly with multiple dialysis sessions over this past week.  Etiology of acute kidney injury still unclear at the moment.  We discussed renal biopsy previously  however patient would like to hold off.  This is a bit complex in her situation as she previously only had 1 working kidney as she had chronic left-sided hydronephrosis.  We have secured a seat for her out an outpatient dialysis center for acute kidney injury.  Renal biopsy could be discussed a bit further as an outpatient.  #Cirrhosis of the liver -Continue to monitor clinically.  Does not appear to be decompensated.  #Acidosis -Serum bicarbonate improved to 22.  Continue to monitor.  #Diarrhea -C. difficile studies are negative -CT abdomen, noncontrast study reports focal areas of thickening involving cecum and ascending colon  #Non-insulin-dependent diabetes mellitus - Lab Results  Component Value Date   HGBA1C 6.1 (H) 12/30/2018  Glycemic control as per hospitalist  #Hyperkalemia. Potassium acceptable at 4.5.  Continue to monitor as an outpatient.    Results for Danielle Rowland, Danielle Rowland (MRN [id number]) as of 12/27/2018 12:41  Ref. Range 12/24/2018 05:00  Cytoplasmic (C-ANCA) Latest Ref Range: Neg:<1:20 titer <1:20  P-ANCA Latest Ref Range: Neg:<1:20 titer <1:20  Atypical P-ANCA titer Latest Ref Range: Neg:<1:20 titer <1:20  C3 Complement Latest Ref Range: 82 - 167 mg/dL 124  Complement C4, Body Fluid Latest Ref Range: 12 - 38 mg/dL 28  Kappa free light  chain Latest Ref Range: 3.3 - 19.4 mg/L 257.0 (H)  Lamda free light chains Latest Ref Range: 5.7 - 26.3 mg/L 162.0 (H)  Kappa, lamda light chain ratio Latest Ref Range: 0.26 - 1.65  1.59     LOS: 8 Danielle Rowland 12/18/20202:43 PM  [employer], [place]  Note: This note was prepared with Dragon dictation. Any transcription errors are unintentional

## 2019-01-01 NOTE — Progress Notes (Signed)
Post HD Assessment   01/01/19 1500  Neurological  Level of Consciousness Alert  Orientation Level Oriented to person;Oriented to place;Disoriented to situation  Respiratory  Respiratory Pattern Regular;Unlabored  Chest Assessment Chest expansion symmetrical  Bilateral Breath Sounds Clear;Diminished  Cardiac  Pulse Irregular  Heart Sounds S1, S2  Jugular Venous Distention (JVD) No  Cardiac Rhythm Atrial fibrillation  Antiarrhythmic device No  Vascular  R Radial Pulse +2  L Radial Pulse +2  Edema Generalized  LUE Edema +1  RLE Edema +2  LLE Edema +2  Integumentary  Integumentary (WDL) X  Skin Color Appropriate for ethnicity  Skin Condition Dry;Flaky  Skin Integrity MASD  Ecchymosis Location Arm  Ecchymosis Location Orientation Bilateral  Musculoskeletal  Musculoskeletal (WDL) X  Generalized Weakness Yes  Gastrointestinal  Bowel Sounds Assessment Active  GU Assessment  Genitourinary Symptoms Other (Comment) (Dialysis )  Psychosocial  Psychosocial (WDL) X  Patient Behaviors Agitated;Flat affect;Tearful  Needs Expressed Emotional  Emotional support given Given to patient

## 2019-01-01 NOTE — Consult Note (Signed)
[place] for Warfarin Indication: atrial fibrillation  Allergies  Allergen Reactions  . Influenza Vaccines   . Iodinated Diagnostic Agents     IVP Dye  . Metformin Hcl Nausea Only and Nausea And Vomiting  . Penicillins   . Sulfa Antibiotics     Patient Measurements: Height: 5' (152.4 cm) Weight: 206 lb 9.1 oz (93.7 kg) IBW/kg (Calculated) : 45.5   Vital Signs: Temp: 98.6 F (37 C) (12/18 1240) Temp Source: Oral (12/18 1240) BP: 123/89 (12/18 1510) Pulse Rate: 118 (12/18 1510)  Labs: Recent Labs    12/30/18 0418 12/31/18 0620 01/01/19 0827  HGB 13.8  --   --   HCT 37.5  --   --   PLT 270  --   --   LABPROT 24.7* 22.4* 23.2*  INR 2.2* 2.0* 2.1*  CREATININE 9.60* 7.51* 6.06*    Estimated Creatinine Clearance: 8 mL/min (A) (by C-G formula based on SCr of 6.06 mg/dL (H)).   Medications:  Warfarin 1 mg daily- last dose 12/23/18  Assessment: Pharmacy has been consulted for warfarin in patient with a PMH significant for atrial fibrillation.   Date INR  Dose  12/9 1.3 ----- 12/10 1.3       2 mg 12/11   1.3 2 mg 12/12 1.3 2.5 mg 12/13 1.4 2.5 mg 12/14 1.5 Dose not given- pt not alert enough per [employer]? 12/15  2.0 2.5 mg 12/16 2.2 2 mg 12/17 2.0 2.5 mg 12/18 2.1  Goal of Therapy:  INR 2-3 Monitor platelets by anticoagulation protocol: Yes   Plan:  INR therapeutic. Will give warfarin 2 mg tonight if pt is still here. F/u INR with am labs. CBC q72h.   Rocky Morel, PharmD Clinical Pharmacist 01/01/2019 3:23 PM

## 2019-01-01 NOTE — Progress Notes (Signed)
Post HD Tx Note  Pt requested Tx to end 57min earlier. Pt tolerated well HD. 838ml of fluids removed. PT continued to be tachy but not different from baseline. Pt reported    01/01/19 1510  Hand-Off documentation  Report given to (Full Name) Hurman Horn RN   Report received from (Full Name) Newt Minion RN  Vital Signs  Temp 97.7 F (36.5 C)  Temp Source Oral  Pulse Rate (!) 118  Pulse Rate Source Monitor  Resp 20  BP 123/89  BP Location Right Arm  BP Method Automatic  Patient Position (if appropriate) Lying  Oxygen Therapy  SpO2 96 %  O2 Device Room Air  Post-Hemodialysis Assessment  Rinseback Volume (mL) 250 mL  KECN 39.9 V  Dialyzer Clearance Lightly streaked  Duration of HD Treatment -hour(s) 2.45 hour(s)  Hemodialysis Intake (mL) 500 mL  UF Total -Machine (mL) 812 mL  Net UF (mL) 312 mL  Tolerated HD Treatment Yes  Hemodialysis Catheter Left Internal jugular Double lumen Permanent (Tunneled)  Placement Date/Time: 12/28/18 1612   Time Out: Correct patient;Correct site;Correct procedure  Maximum sterile barrier precautions: Hand hygiene;Cap;Mask;Sterile gown;Sterile gloves;Large sterile sheet  Site Prep: Chlorhexidine (preferred)  Local Anes...  Site Condition No complications  Blue Lumen Status Heparin locked  Red Lumen Status Heparin locked  Purple Lumen Status N/A  Catheter fill solution Heparin 1000 units/ml  Catheter fill volume (Arterial) 1.7 cc  Catheter fill volume (Venous) 1.7  Dressing Type Biopatch;Occlusive  Dressing Status Clean;Dry;Intact  Drainage Description None  Dressing Change Due 01/05/19  Post treatment catheter status Capped and Clamped  no chest pain or SOB after Tx

## 2019-01-01 NOTE — Progress Notes (Signed)
New hemodialysis patient accepted at [facility] for MWF 11:20 chair time. Clinic can start patient on Monday 12/21at 10:45. If patient is not able to stand and pivot to chair with 1x assist, SNF will need to send patient with a hoy pad under so that clinic will be able to hoyer patient to chair. Please contact me with any questions or concerns for dialysis placement.  Elvera Bicker Dialysis  Coordinator [phone]

## 2019-01-01 NOTE — TOC Progression Note (Signed)
Transition of Care [facility]) - Progression Note    Patient Details  Name: Nilah Belcourt MRN: [id number] Date of Birth: 1940-11-25  Transition of Care [facility]) CM/SW [place], RN Phone Number: 01/01/2019, 10:24 AM  Clinical Narrative:     Received Authorization for SNF from [facility].  Caller: Massie Kluver Auth# [id number]  Start date: 01/01/2019, next review date 01/05/2019.  Navi Coordinator:  Melanie Poteat  Notified Neoma Laming, Admissions Coordinator at [facility]   Expected Discharge Plan: [place] Barriers to Discharge: Continued Medical Work up, SNF Pending bed offer  Expected Discharge Plan and Services Expected Discharge Plan: [place] arrangements for the past 2 months: Apartment Expected Discharge Date: 01/01/19                                     Social Determinants of Health (SDOH) Interventions    Readmission Risk Interventions No flowsheet data found.

## 2019-01-01 NOTE — TOC Progression Note (Signed)
Transition of Care [facility]) - Progression Note    Patient Details  Name: Danielle Rowland MRN: [id number] Date of Birth: 1940/09/17  Transition of Care [facility]) CM/SW [place], RN Phone Number: 01/01/2019, 8:32 AM  Clinical Narrative:     Spoke with Admissions Coordinator this morning, She is working on scheduling with Dialysis center.  Awaiting Auth # from [place].  [id number]  Expected Discharge Plan: [place] Barriers to Discharge: Continued Medical Work up, SNF Pending bed offer  Expected Discharge Plan and Services Expected Discharge Plan: [place] arrangements for the past 2 months: Apartment                                       Social Determinants of Health (SDOH) Interventions    Readmission Risk Interventions No flowsheet data found.

## 2019-01-01 NOTE — Progress Notes (Signed)
Pre HD Tx Note    01/01/19 1240  Hand-Off documentation  Report given to (Full Name) Beatris Ship RN  Report received from (Full Name) Christopehr Richardson Landry RN   Vital Signs  Temp 98.6 F (37 C)  Temp Source Oral  Pulse Rate (!) 109  Pulse Rate Source Monitor  BP 115/60  BP Location Right Arm  BP Method Automatic  Patient Position (if appropriate) Sitting  Oxygen Therapy  SpO2 98 %  O2 Device Room Air  Time-Out for Hemodialysis  What Procedure? HD   Pt Identifiers(min of two) MRN/Account#;First/Last Name  Correct Site? Yes  Correct Side? Yes  Correct Procedure? Yes  Consents Verified? Yes  Safety Precautions Reviewed? Yes  Engineer, civil (consulting) Number [employer] Number 2  UF/Alarm Test Passed  Conductivity: Meter 14  Conductivity: Machine  13.6  pH 7.2  Reverse Osmosis Main  Normal Saline Lot Number [id number]  Dialyzer Lot Number [id number]  Disposable Set Lot Number [id number]  Machine Temperature (!) 99.7 F (37.6 C)  Musician and Audible Yes  Blood Lines Intact and Secured Yes  Pre Treatment Patient Checks  Vascular access used during treatment Catheter  HD catheter dressing before treatment WDL  Patient is receiving dialysis in a chair Yes  Hepatitis B Surface Antigen Results Negative  Date Hepatitis B Surface Antigen Drawn 12/25/18  Hepatitis B Surface Antibody  (<10)  Date Hepatitis B Surface Antibody Drawn 12/25/18  Hemodialysis Consent Verified Yes  Hemodialysis Standing Orders Initiated Yes  ECG (Telemetry) Monitor On Yes  Prime Ordered Normal Saline  Length of  DialysisTreatment -hour(s) 3 Hour(s)  Dialysis Treatment Comments  (Na140)  Dialyzer Elisio 17H NR  Dialysate 3K;2.5 Ca  Dialysis Anticoagulant None  Dialysate Flow Ordered 600  Blood Flow Rate Ordered 300 mL/min  Ultrafiltration Goal 1 Liters  Pre Treatment Labs Phosphorus  Dialysis Blood Pressure Support Ordered Albumin  Hemodialysis Catheter Left Internal jugular Double lumen  Permanent (Tunneled)  Placement Date/Time: 12/28/18 1612   Time Out: Correct patient;Correct site;Correct procedure  Maximum sterile barrier precautions: Hand hygiene;Cap;Mask;Sterile gown;Sterile gloves;Large sterile sheet  Site Prep: Chlorhexidine (preferred)  Local Anes...  Site Condition No complications  Blue Lumen Status Flushed;Blood return noted  Red Lumen Status Flushed;Blood return noted  Purple Lumen Status N/A  Catheter fill solution Heparin 1000 units/ml  Catheter fill volume (Arterial) 1.7 cc  Catheter fill volume (Venous) 1.7  Dressing Type Biopatch;Occlusive  Dressing Status Clean;Dry;Intact  Drainage Description None  Dressing Change Due 01/04/19

## 2019-01-01 NOTE — Progress Notes (Addendum)
Hypoglycemic Event  CBG: 67  Treatment:  4oz juice   Symptoms: None  Follow-up CBG: Time:15 min CBG Result:80  Possible Reasons for Event: Patient a diabetic and post dialysis  Comments/MD notified:  Hypoglycemic protocol initiated.    Danielle Rowland

## 2019-01-01 NOTE — Progress Notes (Signed)
Pre HD Assessment   01/01/19 1235  Neurological  Level of Consciousness Alert  Orientation Level Oriented to person;Oriented to place;Disoriented to situation  Respiratory  Respiratory Pattern Regular;Unlabored  Chest Assessment Chest expansion symmetrical  Bilateral Breath Sounds Diminished  Cardiac  Pulse Irregular  Heart Sounds S1, S2  Jugular Venous Distention (JVD) No  Cardiac Rhythm Atrial fibrillation  Antiarrhythmic device No  Vascular  R Radial Pulse +2  L Radial Pulse +2  Edema Generalized  LUE Edema +1  RLE Edema +2  LLE Edema +2  Integumentary  Integumentary (WDL) X  Skin Color Appropriate for ethnicity  Skin Condition Dry;Flaky  Skin Integrity MASD  Ecchymosis Location Arm  Ecchymosis Location Orientation Bilateral  Musculoskeletal  Musculoskeletal (WDL) X  Generalized Weakness Yes  Gastrointestinal  Bowel Sounds Assessment Active  Last BM Date 12/31/18  GU Assessment  Genitourinary Symptoms Other (Comment) (Dialysis )  Psychosocial  Psychosocial (WDL) WDL

## 2019-01-01 NOTE — Progress Notes (Signed)
Called report to S McDade LPN at [facility] patient going to rm [id number]

## 2019-01-01 NOTE — Progress Notes (Signed)
OT Cancellation Note  Patient Details Name: Danielle Rowland MRN: [id number] DOB: [birth year]   Cancelled Treatment:    Reason Eval/Treat Not Completed: Patient at procedure or test/ unavailable. Pt out for dialysis. Will re-attempt at later date/time as pt is available and medically appropriate.   Jeni Salles, MPH, MS, OTR/L ascom [phone] 01/01/19, 1:24 PM

## 2019-01-01 NOTE — Plan of Care (Signed)
  Problem: Clinical Measurements: Goal: Ability to maintain clinical measurements within normal limits will improve Outcome: Progressing Goal: Will remain free from infection Outcome: Progressing Goal: Diagnostic test results will improve Outcome: Progressing Goal: Respiratory complications will improve Outcome: Progressing Goal: Cardiovascular complication will be avoided Outcome: Progressing  Pt is currently receiving Dialysis in chair. On Room Air. Pt HR is Tachy but not different from baseline. HD is progressing

## 2019-01-01 NOTE — Telephone Encounter (Signed)
Trued calling patient. Left message to call back. 8AM slot on hold until patient returns call.

## 2019-01-01 NOTE — Progress Notes (Signed)
[facility] here to transport patient to [place] distress noted.

## 2019-01-01 NOTE — Progress Notes (Signed)
PT Cancellation Note  Patient Details Name: Danielle Rowland MRN: [id number] DOB: 09-18-1940   Cancelled Treatment:    Reason Eval/Treat Not Completed: Patient at procedure or test/unavailable.  Pt currently off unit at dialysis.  Will re-attempt PT treatment at a later date/time.   Raquel Sarna Annaleigh Steinmeyer 01/01/2019, 1:30 PM

## 2019-01-01 NOTE — Telephone Encounter (Signed)
From [facility]  Copied from [place] [phone]. Topic: Appointment Scheduling - Scheduling Inquiry for Clinic >> Jan 01, 2019 12:52 PM Erick Blinks wrote: Reason for CRM: Hospital called to schedule hosp fu for pt, pt is scheduled with Laverna Peace instead of PCP. Please advise if work in is available for PCP

## 2019-01-01 NOTE — Care Management Important Message (Signed)
Important Message  Patient Details  Name: Danielle Rowland MRN: [id number] Date of Birth: 15-Mar-1940   Medicare Important Message Given:  Yes     [name] 01/01/2019, 1:49 PM

## 2019-01-01 NOTE — Discharge Summary (Signed)
[facility] NAME: Danielle Rowland    MR#:  [id number]  DATE OF BIRTH:  03-14-1940  DATE OF ADMISSION:  12/23/2018 ADMITTING PHYSICIAN: Athena Masse, MD  DATE OF DISCHARGE: 01/01/2019 PRIMARY CARE PHYSICIAN: Birdie Sons, MD    ADMISSION DIAGNOSIS:  Nausea vomiting and diarrhea [R11.2, R19.7] Sepsis ([place]) [A41.9] Atrial fibrillation, unspecified type ([place]) [I48.91] Sepsis with acute renal failure without septic shock, due to unspecified organism, unspecified acute renal failure type ([place]) [A41.9, R65.20, N17.9]  DISCHARGE DIAGNOSIS:  acute gastroenteritis acute kidney injury/ATN with hypovolemia/volume loss due to gastroenteritis--- patient now on dialysis acute metabolic acidosis  chronic atrial fibrillation on Coumadin chronic anxiety SECONDARY DIAGNOSIS:   Past Medical History:  Diagnosis Date  . Erythrocytosis 06/05/2014  . History of radiation therapy   . Leukocytosis 06/05/2014  . Rectal cancer ([place]) 05/2002    HOSPITAL COURSE:   78 y/o female with history of diabetes, A fib on coumadin, HTN and CLL, presents with 4 day history of diarrhea and vomiting. She was noted to be in acute renal failure with creatinine of 13, metabolic acidosis and elevated wbc count. CT abdomen showedevidence of colitis. C diffand GI pathogen panelwas found to be negative.  1. Acute gastroenteritis--improved -Sepsis on admission ruled out.  -received  IV hydration.  -Stool for C. difficile and GI pathogen panel negative.  -Blood culture has shown no growth. Overall diarrhea improving. She is afebrile at this time. -Lactic acidosis is improving with IV hydration.  2. Acute kidney injury/ATN with volume loss at [place]. Likely related to severe dehydration from diarrhea/colitis.  -Nephrology consulted-- started on hemodialysis (new). -Renal ultrasound showed left-sided hydronephrosis, but this was a chronic finding. Seen by  urology and stent placement was not felt to be indicated. CK levels noted to be normal. -PermCath was placed on 12/14 and she underwent her dialysis. No UOP documented -patient likely will need dialysis as outpatient-- she worker consulted  Acute Metabolic acidosis. Suspect is related to GI losses/dehydration/renal failure.She was treated with bicarbonate infusion which improved serum bicarb.   4. Chronic left Upper extremity swelling 5. [place] left Upper extremity negative for DVT  5. Type 2 diabetes, non-insulin-dependent, hold oral agents. She was having episodes of hypoglycemia. Continue on dextrose in maintenance fluid. A1c of 6.1. -Sugars stable. -cont SSI--po actos d/ced  6. Chronic atrial fibrillation. Presented with rapid ventricular response in the setting of dehydration and acute illness.  -She is anticoagulated with Coumadin.  -Continue rate control with metoprolol.   7. Chronic lymphocytic leukemia. Follow-up with oncology as an outpatient.  8. Generalized weakness with deconditioning -says she ambulates at home without any help.  -PT consultation placed--recommends rehab  9.Hypocalcemia.When corrected for albumin, it is still low at 7.1. Ionized calcium has been less than 3.  -cont po TUms  10. Chronic anxiety -cont prn xanax  Spoke with patient's sister Diane in the room. Given multiple medical issues and comorbidities along with new dialysis patient will benefit from rehab at this time.Patient has dialysis chair time obtained. Repeat COVID negative.  Discharge later today   Procedures: HD catheter placement Family communication : sister Diane's  Consults : vascular nephrology  discharge Disposition : rehab CODE STATUS: DNR prior to admission DVT Prophylaxis :warfarin  CONSULTS OBTAINED:  Treatment Team:  Billey Co, MD  DRUG ALLERGIES:   Allergies  Allergen Reactions  . Influenza Vaccines   . Iodinated Diagnostic Agents  IVP Dye  . Metformin Hcl Nausea Only and Nausea And Vomiting  . Penicillins   . Sulfa Antibiotics     DISCHARGE MEDICATIONS:   Allergies as of 01/01/2019      Reactions   Influenza Vaccines    Iodinated Diagnostic Agents    IVP Dye   Metformin Hcl Nausea Only, Nausea And Vomiting   Penicillins    Sulfa Antibiotics       Medication List    STOP taking these medications   amLODipine 2.5 MG tablet Commonly known as: NORVASC   pioglitazone 15 MG tablet Commonly known as: ACTOS     TAKE these medications   ALPRAZolam 0.25 MG tablet Commonly known as: XANAX Take 1 tablet (0.25 mg total) by mouth 3 (three) times daily as needed for anxiety. What changed:   medication strength  how much to take  how to take this  when to take this  reasons to take this  additional instructions   calcium carbonate 500 MG chewable tablet Commonly known as: TUMS - dosed in mg elemental calcium Chew 1 tablet (200 mg of elemental calcium total) by mouth 3 (three) times daily with meals.   insulin aspart 100 UNIT/ML injection Commonly known as: novoLOG Inject 0-9 Units into the skin 3 (three) times daily with meals.   metoprolol tartrate 25 MG tablet Commonly known as: LOPRESSOR Take 1 tablet (25 mg total) by mouth 2 (two) times daily.   promethazine 25 MG tablet Commonly known as: PHENERGAN TAKE 1 TABLET (25 MG TOTAL) BY MOUTH EVERY 6 (SIX) HOURS AS NEEDED. NAUSEA   Vitamin D3 125 MCG (5000 UT) Caps Take 5,000 Units by mouth daily.   warfarin 1 MG tablet Commonly known as: COUMADIN Take 2 tablets (2 mg total) by mouth daily at 6 PM. What changed:   how much to take  when to take this       If you experience worsening of your admission symptoms, develop shortness of breath, life threatening emergency, suicidal or homicidal thoughts you must seek medical attention immediately by calling 911 or calling your MD immediately  if symptoms less severe.  You Must read  complete instructions/literature along with all the possible adverse reactions/side effects for all the Medicines you take and that have been prescribed to you. Take any new Medicines after you have completely understood and accept all the possible adverse reactions/side effects.   Please note  You were cared for by a hospitalist during your hospital stay. If you have any questions about your discharge medications or the care you received while you were in the hospital after you are discharged, you can call the unit and asked to speak with the hospitalist on call if the hospitalist that took care of you is not available. Once you are discharged, your primary care physician will handle any further medical issues. Please note that NO REFILLS for any discharge medications will be authorized once you are discharged, as it is imperative that you return to your primary care physician (or establish a relationship with a primary care physician if you do not have one) for your aftercare needs so that they can reassess your need for medications and monitor your lab values. Today   SUBJECTIVE   Still requesting to go home. Little anxious.  VITAL SIGNS:  Blood pressure (!) 117/103, pulse (!) 122, temperature 98.5 F (36.9 C), resp. rate 17, height 5' (1.524 m), weight 93.7 kg, SpO2 93 %.  I/O:    Intake/Output  Summary (Last 24 hours) at 01/01/2019 5053 Last data filed at 12/31/2018 2245 Gross per 24 hour  Intake 240 ml  Output 1000 ml  Net -760 ml    PHYSICAL EXAMINATION:  GENERAL:  78 y.o.-year-old patient lying in the bed with no acute distress. obese EYES: Pupils equal, round, reactive to light and accommodation. No scleral icterus. Extraocular muscles intact.  HEENT: Head atraumatic, normocephalic. Oropharynx and nasopharynx clear.  NECK:  Supple, no jugular venous distention. No thyroid enlargement, no tenderness.  LUNGS: Normal breath sounds bilaterally, no wheezing, rales,rhonchi or  crepitation. No use of accessory muscles of respiration. HD cath + CARDIOVASCULAR: S1, S2 normal. No murmurs, rubs, or gallops.  ABDOMEN: Soft, non-tender, non-distended. Bowel sounds present. No organomegaly or mass.  EXTREMITIES: No pedal edema, cyanosis, or clubbing. Left Upper extremity chronically swollen NEUROLOGIC: Cranial nerves II through XII are intact. Muscle strength 5/5 in all extremities. Sensation intact. Gait not checked.  PSYCHIATRIC: The patient is alert and oriented x 3.  SKIN: No obvious rash, lesion, or ulcer.   DATA REVIEW:   CBC  Recent Labs  Lab 12/30/18 0418  WBC 14.1*  HGB 13.8  HCT 37.5  PLT 270    Chemistries  Recent Labs  Lab 12/31/18 0620  NA 133*  K 4.4  CL 92*  CO2 21*  GLUCOSE 105*  BUN 67*  CREATININE 7.51*  CALCIUM 6.6*    Microbiology Results   Recent Results (from the past 240 hour(s))  GI pathogen panel by PCR, stool     Status: None   Collection Time: 12/23/18 10:28 PM   Specimen: STOOL  Result Value Ref Range Status   Plesiomonas shigelloides NOT DETECTED NOT DETECTED Final   Yersinia enterocolitica NOT DETECTED NOT DETECTED Final   Vibrio NOT DETECTED NOT DETECTED Final   Enteropathogenic E coli NOT DETECTED NOT DETECTED Final   E coli (ETEC) LT/ST NOT DETECTED NOT DETECTED Final   E coli 9767 by PCR Not applicable NOT DETECTED Final   Cryptosporidium by PCR NOT DETECTED NOT DETECTED Final   Entamoeba histolytica NOT DETECTED NOT DETECTED Final   Adenovirus F 40/41 NOT DETECTED NOT DETECTED Final   Norovirus GI/GII NOT DETECTED NOT DETECTED Final   Sapovirus NOT DETECTED NOT DETECTED Final    Comment: (NOTE) Performed At: [facility] [place], [place] [id number] Rush Farmer MD IO:[id number]    Vibrio cholerae NOT DETECTED NOT DETECTED Final   Campylobacter by PCR NOT DETECTED NOT DETECTED Final   Salmonella by PCR NOT DETECTED NOT DETECTED Final   E coli (STEC) NOT DETECTED NOT DETECTED  Final   Enteroaggregative E coli NOT DETECTED NOT DETECTED Final   Shigella by PCR NOT DETECTED NOT DETECTED Final   Cyclospora cayetanensis NOT DETECTED NOT DETECTED Final   Astrovirus NOT DETECTED NOT DETECTED Final   G lamblia by PCR NOT DETECTED NOT DETECTED Final   Rotavirus A by PCR NOT DETECTED NOT DETECTED Final  C Difficile Quick Screen w PCR reflex     Status: None   Collection Time: 12/23/18 10:28 PM   Specimen: STOOL  Result Value Ref Range Status   C Diff antigen NEGATIVE NEGATIVE Final   C Diff toxin NEGATIVE NEGATIVE Final   C Diff interpretation No C. difficile detected.  Final    Comment: Performed at [facility], [address], [place], [place] [address]  Urine culture     Status: Abnormal   Collection Time: 12/23/18 10:28 PM  Specimen: In/Out Cath Urine  Result Value Ref Range Status   Specimen Description   Final    IN/OUT CATH URINE Performed at [facility], [address] [place] [place], [place] [address]    Special Requests   Final    NONE Performed at [facility], [address] [place], [place] [address]    Culture (A)  Final    7,000 COLONIES/mL ESCHERICHIA COLI 1,000 COLONIES/mL ENTEROCOCCUS FAECALIS    Report Status 12/27/2018 FINAL  Final   Organism ID, Bacteria ESCHERICHIA COLI (A)  Final   Organism ID, Bacteria ENTEROCOCCUS FAECALIS (A)  Final      Susceptibility   Escherichia coli - MIC*    AMPICILLIN >=32 RESISTANT Resistant     CEFAZOLIN <=4 SENSITIVE Sensitive     CEFTRIAXONE <=1 SENSITIVE Sensitive     CIPROFLOXACIN >=4 RESISTANT Resistant     GENTAMICIN <=1 SENSITIVE Sensitive     IMIPENEM <=0.25 SENSITIVE Sensitive     NITROFURANTOIN <=16 SENSITIVE Sensitive     TRIMETH/SULFA >=320 RESISTANT Resistant     AMPICILLIN/SULBACTAM 16 INTERMEDIATE Intermediate     PIP/TAZO <=4 SENSITIVE Sensitive     * 7,000 COLONIES/mL ESCHERICHIA COLI   Enterococcus faecalis - MIC*    AMPICILLIN <=2 SENSITIVE Sensitive      NITROFURANTOIN <=16 SENSITIVE Sensitive     VANCOMYCIN 1 SENSITIVE Sensitive     * 1,000 COLONIES/mL ENTEROCOCCUS FAECALIS  Blood Culture (routine x 2)     Status: None   Collection Time: 12/23/18 10:29 PM   Specimen: BLOOD  Result Value Ref Range Status   Specimen Description BLOOD RIGHT ANTECUBITAL  Final   Special Requests   Final    BOTTLES DRAWN AEROBIC AND ANAEROBIC Blood Culture results may not be optimal due to an excessive volume of blood received in culture bottles   Culture   Final    NO GROWTH 5 DAYS Performed at [facility], [address] [place], [place] [address]    Report Status 12/28/2018 FINAL  Final  SARS CORONAVIRUS 2 (TAT 6-24 HRS) Nasopharyngeal Nasopharyngeal Swab     Status: None   Collection Time: 12/24/18 12:07 AM   Specimen: Nasopharyngeal Swab  Result Value Ref Range Status   SARS Coronavirus 2 NEGATIVE NEGATIVE Final    Comment: (NOTE) SARS-CoV-2 target nucleic acids are NOT DETECTED. The SARS-CoV-2 RNA is generally detectable in upper and lower respiratory specimens during the acute phase of infection. Negative results do not preclude SARS-CoV-2 infection, do not rule out co-infections with other pathogens, and should not be used as the sole basis for treatment or other patient management decisions. Negative results must be combined with clinical observations, patient history, and epidemiological information. The expected result is Negative. Fact Sheet for Patients: SugarRoll.be Fact Sheet for Healthcare Providers: https://www.woods-mathews.com/ This test is not yet approved or cleared by the [place] FDA and  has been authorized for detection and/or diagnosis of SARS-CoV-2 by FDA under an Emergency Use Authorization (EUA). This EUA will remain  in effect (meaning this test can be used) for the duration of the COVID-19 declaration under Section 56 4(b)(1) of the Act, 21 U.S.C. section  360bbb-3(b)(1), unless the authorization is terminated or revoked sooner. Performed at [facility], [place] [address] [place], [place] [address]   Urine Culture     Status: Abnormal   Collection Time: 12/27/18  6:41 AM   Specimen: Urine, Random  Result Value Ref Range Status   Specimen Description  Final    URINE, RANDOM Performed at [facility], [address] [place], [place] [address]    Special Requests   Final    NONE Performed at [facility], [place], [place] [address]    Culture 80,000 COLONIES/mL ENTEROCOCCUS FAECALIS (A)  Final   Report Status 12/30/2018 FINAL  Final   Organism ID, Bacteria ENTEROCOCCUS FAECALIS (A)  Final      Susceptibility   Enterococcus faecalis - MIC*    AMPICILLIN <=2 SENSITIVE Sensitive     NITROFURANTOIN <=16 SENSITIVE Sensitive     VANCOMYCIN 1 SENSITIVE Sensitive     * 80,000 COLONIES/mL ENTEROCOCCUS FAECALIS  SARS CORONAVIRUS 2 (TAT 6-24 HRS) Nasopharyngeal Nasopharyngeal Swab     Status: None   Collection Time: 12/31/18  2:31 PM   Specimen: Nasopharyngeal Swab  Result Value Ref Range Status   SARS Coronavirus 2 NEGATIVE NEGATIVE Final    Comment: (NOTE) SARS-CoV-2 target nucleic acids are NOT DETECTED. The SARS-CoV-2 RNA is generally detectable in upper and lower respiratory specimens during the acute phase of infection. Negative results do not preclude SARS-CoV-2 infection, do not rule out co-infections with other pathogens, and should not be used as the sole basis for treatment or other patient management decisions. Negative results must be combined with clinical observations, patient history, and epidemiological information. The expected result is Negative. Fact Sheet for Patients: SugarRoll.be Fact Sheet for Healthcare Providers: https://www.woods-mathews.com/ This test is not yet approved or cleared by the [place] FDA and  has been  authorized for detection and/or diagnosis of SARS-CoV-2 by FDA under an Emergency Use Authorization (EUA). This EUA will remain  in effect (meaning this test can be used) for the duration of the COVID-19 declaration under Section 56 4(b)(1) of the Act, 21 U.S.C. section 360bbb-3(b)(1), unless the authorization is terminated or revoked sooner. Performed at [facility], [place] [address], [place], [place] [address]     RADIOLOGY:  US Venous Img Upper Uni Left (DVT)  Result Date: 12/31/2018 CLINICAL DATA:  History of left-sided dialysis catheter insertion on 12/28/2018 now with pain and edema. Evaluate for DVT. EXAM: LEFT UPPER EXTREMITY VENOUS DOPPLER ULTRASOUND TECHNIQUE: Gray-scale sonography with graded compression, as well as color Doppler and duplex ultrasound were performed to evaluate the upper extremity deep venous system from the level of the subclavian vein and including the jugular, axillary, basilic, radial, ulnar and upper cephalic vein. Spectral Doppler was utilized to evaluate flow at rest and with distal augmentation maneuvers. COMPARISON:  None. FINDINGS: Contralateral Subclavian Vein: Respiratory phasicity is normal and symmetric with the symptomatic side. No evidence of thrombus. Normal compressibility. Internal Jugular Vein: No evidence of thrombus. Normal compressibility, respiratory phasicity and response to augmentation. Subclavian Vein: No evidence of thrombus. Normal compressibility, respiratory phasicity and response to augmentation. Axillary Vein: No evidence of thrombus. Normal compressibility, respiratory phasicity and response to augmentation. Cephalic Vein: No evidence of thrombus. Normal compressibility, respiratory phasicity and response to augmentation. Basilic Vein: No evidence of thrombus. Normal compressibility, respiratory phasicity and response to augmentation. Brachial Veins: No evidence of thrombus within either of the paired brachial veins. Normal  compressibility, respiratory phasicity and response to augmentation. Radial Veins: No evidence of thrombus. Normal compressibility, respiratory phasicity and response to augmentation. Ulnar Veins: No evidence of thrombus. Normal compressibility, respiratory phasicity and response to augmentation. Venous Reflux:  None visualized. Other Findings:  None visualized. IMPRESSION: No evidence of DVT within the left upper extremity. Electronically Signed  By: Sandi Mariscal M.D.   On: 12/31/2018 16:39     CODE STATUS:     Code Status Orders  (From admission, onward)         Start     Ordered   12/24/18 0307  Do not attempt resuscitation (DNR)  Continuous    Question Answer Comment  In the event of cardiac or respiratory ARREST Do not call a "code blue"   In the event of cardiac or respiratory ARREST Do not perform Intubation, CPR, defibrillation or ACLS   In the event of cardiac or respiratory ARREST Use medication by any route, position, wound care, and other measures to relive pain and suffering. May use oxygen, suction and manual treatment of airway obstruction as needed for comfort.      12/24/18 0309        Code Status History    This patient has a current code status but no historical code status.   Advance Care Planning Activity    Advance Directive Documentation     Most Recent Value  Type of Advance Directive  Healthcare Power of Attorney, Living will  Pre-existing out of facility DNR order (yellow form or pink MOST form)  -  "MOST" Form in Place?  -       TOTAL TIME TAKING CARE OF THIS PATIENT: *40* minutes.    Fritzi Mandes M.D on 01/01/2019 at 8:52 AM  Between 7am to 6pm - Pager - [phone] After 6pm go to [email or web address] - password [email or web address]  [facility]    CC: Primary care physician; Birdie Sons, MD

## 2019-01-01 NOTE — Progress Notes (Signed)
HD Tx Started    01/01/19 1245  Vital Signs  Pulse Rate (!) 110  BP 115/60  Oxygen Therapy  SpO2 98 %  O2 Device Room Air  During Hemodialysis Assessment  Blood Flow Rate (mL/min) 400 mL/min  Arterial Pressure (mmHg) 120 mmHg  Venous Pressure (mmHg) 100 mmHg  Transmembrane Pressure (mmHg) 10 mmHg  Ultrafiltration Rate (mL/min) 330 mL/min  Dialysate Flow Rate (mL/min) 600 ml/min  Conductivity: Machine  14.1  HD Safety Checks Performed Yes  Dialysis Fluid Bolus Normal Saline  Bolus Amount (mL) 250 mL  Intra-Hemodialysis Comments Tx initiated

## 2019-01-01 NOTE — Telephone Encounter (Signed)
I could see her at 8am on Tuesday the 22nd

## 2019-01-01 NOTE — Progress Notes (Signed)
HD Tx Completed    01/01/19 1500  Vital Signs  Pulse Rate 97  Resp (!) 23  BP 112/72  Oxygen Therapy  SpO2 97 %  O2 Device Room Air  During Hemodialysis Assessment  Blood Flow Rate (mL/min) 300 mL/min  Arterial Pressure (mmHg) -150 mmHg  Venous Pressure (mmHg) 140 mmHg  Transmembrane Pressure (mmHg) 30 mmHg  Ultrafiltration Rate (mL/min) 330 mL/min  Dialysate Flow Rate (mL/min) 600 ml/min  Conductivity: Machine  14  HD Safety Checks Performed Yes  Intra-Hemodialysis Comments Tx completed

## 2019-01-01 NOTE — Discharge Instructions (Signed)
Your dialysis is scheduled Monday Wednesday Friday by the nephrologist

## 2019-01-01 NOTE — Progress Notes (Signed)
Transfer pack used to dress patient for discharge to [facility]

## 2019-01-03 ENCOUNTER — Emergency Department: Payer: Medicare Other

## 2019-01-03 ENCOUNTER — Inpatient Hospital Stay
Admission: EM | Admit: 2019-01-03 | Discharge: 2019-01-07 | DRG: 871 | Disposition: A | Discharge status: Hospice | Payer: Medicare Other | Attending: Internal Medicine | Admitting: Internal Medicine

## 2019-01-03 ENCOUNTER — Other Ambulatory Visit: Payer: Self-pay

## 2019-01-03 ENCOUNTER — Encounter: Payer: Self-pay | Admitting: Emergency Medicine

## 2019-01-03 DIAGNOSIS — R609 Edema, unspecified: Secondary | ICD-10-CM

## 2019-01-03 DIAGNOSIS — Z85048 Personal history of other malignant neoplasm of rectum, rectosigmoid junction, and anus: Secondary | ICD-10-CM

## 2019-01-03 DIAGNOSIS — E11649 Type 2 diabetes mellitus with hypoglycemia without coma: Secondary | ICD-10-CM | POA: Diagnosis present

## 2019-01-03 DIAGNOSIS — E162 Hypoglycemia, unspecified: Secondary | ICD-10-CM | POA: Diagnosis present

## 2019-01-03 DIAGNOSIS — R4189 Other symptoms and signs involving cognitive functions and awareness: Secondary | ICD-10-CM | POA: Diagnosis present

## 2019-01-03 DIAGNOSIS — I959 Hypotension, unspecified: Secondary | ICD-10-CM | POA: Diagnosis not present

## 2019-01-03 DIAGNOSIS — Z880 Allergy status to penicillin: Secondary | ICD-10-CM

## 2019-01-03 DIAGNOSIS — E871 Hypo-osmolality and hyponatremia: Secondary | ICD-10-CM | POA: Diagnosis present

## 2019-01-03 DIAGNOSIS — K7469 Other cirrhosis of liver: Secondary | ICD-10-CM | POA: Diagnosis present

## 2019-01-03 DIAGNOSIS — Z882 Allergy status to sulfonamides status: Secondary | ICD-10-CM

## 2019-01-03 DIAGNOSIS — Z9081 Acquired absence of spleen: Secondary | ICD-10-CM | POA: Diagnosis not present

## 2019-01-03 DIAGNOSIS — I4891 Unspecified atrial fibrillation: Secondary | ICD-10-CM | POA: Diagnosis present

## 2019-01-03 DIAGNOSIS — R0602 Shortness of breath: Secondary | ICD-10-CM | POA: Diagnosis not present

## 2019-01-03 DIAGNOSIS — R570 Cardiogenic shock: Secondary | ICD-10-CM | POA: Diagnosis present

## 2019-01-03 DIAGNOSIS — E8770 Fluid overload, unspecified: Secondary | ICD-10-CM | POA: Diagnosis not present

## 2019-01-03 DIAGNOSIS — Z833 Family history of diabetes mellitus: Secondary | ICD-10-CM

## 2019-01-03 DIAGNOSIS — T68XXXA Hypothermia, initial encounter: Secondary | ICD-10-CM | POA: Diagnosis not present

## 2019-01-03 DIAGNOSIS — Z7901 Long term (current) use of anticoagulants: Secondary | ICD-10-CM

## 2019-01-03 DIAGNOSIS — G92 Toxic encephalopathy: Secondary | ICD-10-CM | POA: Diagnosis present

## 2019-01-03 DIAGNOSIS — A419 Sepsis, unspecified organism: Secondary | ICD-10-CM

## 2019-01-03 DIAGNOSIS — E1122 Type 2 diabetes mellitus with diabetic chronic kidney disease: Secondary | ICD-10-CM | POA: Diagnosis present

## 2019-01-03 DIAGNOSIS — I120 Hypertensive chronic kidney disease with stage 5 chronic kidney disease or end stage renal disease: Secondary | ICD-10-CM | POA: Diagnosis present

## 2019-01-03 DIAGNOSIS — M8580 Other specified disorders of bone density and structure, unspecified site: Secondary | ICD-10-CM | POA: Diagnosis present

## 2019-01-03 DIAGNOSIS — Z66 Do not resuscitate: Secondary | ICD-10-CM | POA: Diagnosis present

## 2019-01-03 DIAGNOSIS — E161 Other hypoglycemia: Secondary | ICD-10-CM | POA: Diagnosis not present

## 2019-01-03 DIAGNOSIS — F419 Anxiety disorder, unspecified: Secondary | ICD-10-CM | POA: Diagnosis present

## 2019-01-03 DIAGNOSIS — Z20828 Contact with and (suspected) exposure to other viral communicable diseases: Secondary | ICD-10-CM | POA: Diagnosis present

## 2019-01-03 DIAGNOSIS — Z923 Personal history of irradiation: Secondary | ICD-10-CM

## 2019-01-03 DIAGNOSIS — J810 Acute pulmonary edema: Secondary | ICD-10-CM

## 2019-01-03 DIAGNOSIS — D631 Anemia in chronic kidney disease: Secondary | ICD-10-CM | POA: Diagnosis present

## 2019-01-03 DIAGNOSIS — A4181 Sepsis due to Enterococcus: Secondary | ICD-10-CM | POA: Diagnosis not present

## 2019-01-03 DIAGNOSIS — Z515 Encounter for palliative care: Secondary | ICD-10-CM | POA: Diagnosis not present

## 2019-01-03 DIAGNOSIS — N170 Acute kidney failure with tubular necrosis: Secondary | ICD-10-CM | POA: Diagnosis present

## 2019-01-03 DIAGNOSIS — M2550 Pain in unspecified joint: Secondary | ICD-10-CM | POA: Diagnosis not present

## 2019-01-03 DIAGNOSIS — Z8042 Family history of malignant neoplasm of prostate: Secondary | ICD-10-CM

## 2019-01-03 DIAGNOSIS — N2581 Secondary hyperparathyroidism of renal origin: Secondary | ICD-10-CM | POA: Diagnosis present

## 2019-01-03 DIAGNOSIS — Z823 Family history of stroke: Secondary | ICD-10-CM

## 2019-01-03 DIAGNOSIS — Z801 Family history of malignant neoplasm of trachea, bronchus and lung: Secondary | ICD-10-CM

## 2019-01-03 DIAGNOSIS — Z7189 Other specified counseling: Secondary | ICD-10-CM | POA: Diagnosis not present

## 2019-01-03 DIAGNOSIS — R0902 Hypoxemia: Secondary | ICD-10-CM | POA: Diagnosis not present

## 2019-01-03 DIAGNOSIS — Z7401 Bed confinement status: Secondary | ICD-10-CM | POA: Diagnosis not present

## 2019-01-03 DIAGNOSIS — K7290 Hepatic failure, unspecified without coma: Secondary | ICD-10-CM | POA: Diagnosis present

## 2019-01-03 DIAGNOSIS — N179 Acute kidney failure, unspecified: Secondary | ICD-10-CM | POA: Diagnosis not present

## 2019-01-03 DIAGNOSIS — Z91041 Radiographic dye allergy status: Secondary | ICD-10-CM

## 2019-01-03 DIAGNOSIS — R4182 Altered mental status, unspecified: Secondary | ICD-10-CM

## 2019-01-03 DIAGNOSIS — I499 Cardiac arrhythmia, unspecified: Secondary | ICD-10-CM | POA: Diagnosis not present

## 2019-01-03 DIAGNOSIS — R6521 Severe sepsis with septic shock: Secondary | ICD-10-CM | POA: Diagnosis present

## 2019-01-03 DIAGNOSIS — Z743 Need for continuous supervision: Secondary | ICD-10-CM | POA: Diagnosis not present

## 2019-01-03 DIAGNOSIS — Z992 Dependence on renal dialysis: Secondary | ICD-10-CM

## 2019-01-03 DIAGNOSIS — Z887 Allergy status to serum and vaccine status: Secondary | ICD-10-CM | POA: Diagnosis not present

## 2019-01-03 DIAGNOSIS — I10 Essential (primary) hypertension: Secondary | ICD-10-CM | POA: Diagnosis present

## 2019-01-03 DIAGNOSIS — N139 Obstructive and reflux uropathy, unspecified: Secondary | ICD-10-CM | POA: Diagnosis not present

## 2019-01-03 DIAGNOSIS — Z794 Long term (current) use of insulin: Secondary | ICD-10-CM

## 2019-01-03 DIAGNOSIS — D751 Secondary polycythemia: Secondary | ICD-10-CM | POA: Diagnosis present

## 2019-01-03 DIAGNOSIS — E1129 Type 2 diabetes mellitus with other diabetic kidney complication: Secondary | ICD-10-CM | POA: Diagnosis present

## 2019-01-03 DIAGNOSIS — Z8249 Family history of ischemic heart disease and other diseases of the circulatory system: Secondary | ICD-10-CM

## 2019-01-03 DIAGNOSIS — E559 Vitamin D deficiency, unspecified: Secondary | ICD-10-CM | POA: Diagnosis present

## 2019-01-03 DIAGNOSIS — C9110 Chronic lymphocytic leukemia of B-cell type not having achieved remission: Secondary | ICD-10-CM | POA: Diagnosis present

## 2019-01-03 DIAGNOSIS — N186 End stage renal disease: Secondary | ICD-10-CM | POA: Diagnosis not present

## 2019-01-03 DIAGNOSIS — F410 Panic disorder [episodic paroxysmal anxiety] without agoraphobia: Secondary | ICD-10-CM | POA: Diagnosis present

## 2019-01-03 DIAGNOSIS — E7800 Pure hypercholesterolemia, unspecified: Secondary | ICD-10-CM | POA: Diagnosis present

## 2019-01-03 DIAGNOSIS — R4020 Unspecified coma: Secondary | ICD-10-CM | POA: Diagnosis not present

## 2019-01-03 DIAGNOSIS — Z90721 Acquired absence of ovaries, unilateral: Secondary | ICD-10-CM

## 2019-01-03 DIAGNOSIS — N136 Pyonephrosis: Secondary | ICD-10-CM | POA: Diagnosis present

## 2019-01-03 DIAGNOSIS — Z79899 Other long term (current) drug therapy: Secondary | ICD-10-CM

## 2019-01-03 DIAGNOSIS — R0681 Apnea, not elsewhere classified: Secondary | ICD-10-CM | POA: Diagnosis not present

## 2019-01-03 DIAGNOSIS — Z9221 Personal history of antineoplastic chemotherapy: Secondary | ICD-10-CM

## 2019-01-03 DIAGNOSIS — Z9049 Acquired absence of other specified parts of digestive tract: Secondary | ICD-10-CM

## 2019-01-03 LAB — BLOOD GAS, VENOUS
Acid-Base Excess: 2.8 mmol/L — ABNORMAL HIGH (ref 0.0–2.0)
Bicarbonate: 31.3 mmol/L — ABNORMAL HIGH (ref 20.0–28.0)
FIO2: 100
O2 Saturation: 55.3 %
Patient temperature: 37
pCO2, Ven: 65 mmHg — ABNORMAL HIGH (ref 44.0–60.0)
pH, Ven: 7.29 (ref 7.250–7.430)
pO2, Ven: 33 mmHg (ref 32.0–45.0)

## 2019-01-03 LAB — CBC WITH DIFFERENTIAL/PLATELET
Abs Immature Granulocytes: 0 10*3/uL (ref 0.00–0.07)
Basophils Absolute: 0 10*3/uL (ref 0.0–0.1)
Basophils Relative: 0 %
Eosinophils Absolute: 0 10*3/uL (ref 0.0–0.5)
Eosinophils Relative: 0 %
HCT: 42 % (ref 36.0–46.0)
Hemoglobin: 14.1 g/dL (ref 12.0–15.0)
Lymphocytes Relative: 3 %
Lymphs Abs: 0.5 10*3/uL — ABNORMAL LOW (ref 0.7–4.0)
MCH: 30.5 pg (ref 26.0–34.0)
MCHC: 33.6 g/dL (ref 30.0–36.0)
MCV: 90.9 fL (ref 80.0–100.0)
Monocytes Absolute: 1.4 10*3/uL — ABNORMAL HIGH (ref 0.1–1.0)
Monocytes Relative: 8 %
Neutro Abs: 16 10*3/uL — ABNORMAL HIGH (ref 1.7–7.7)
Neutrophils Relative %: 89 %
Platelets: 193 10*3/uL (ref 150–400)
RBC: 4.62 MIL/uL (ref 3.87–5.11)
RDW: 15.7 % — ABNORMAL HIGH (ref 11.5–15.5)
WBC: 18 10*3/uL — ABNORMAL HIGH (ref 4.0–10.5)
nRBC: 11 /100 WBC — ABNORMAL HIGH
nRBC: 6.8 % — ABNORMAL HIGH (ref 0.0–0.2)

## 2019-01-03 LAB — BRAIN NATRIURETIC PEPTIDE: B Natriuretic Peptide: 2659 pg/mL — ABNORMAL HIGH (ref 0.0–100.0)

## 2019-01-03 LAB — GLUCOSE, CAPILLARY
Glucose-Capillary: 104 mg/dL — ABNORMAL HIGH (ref 70–99)
Glucose-Capillary: 104 mg/dL — ABNORMAL HIGH (ref 70–99)
Glucose-Capillary: 107 mg/dL — ABNORMAL HIGH (ref 70–99)
Glucose-Capillary: 115 mg/dL — ABNORMAL HIGH (ref 70–99)
Glucose-Capillary: 132 mg/dL — ABNORMAL HIGH (ref 70–99)
Glucose-Capillary: 201 mg/dL — ABNORMAL HIGH (ref 70–99)
Glucose-Capillary: 54 mg/dL — ABNORMAL LOW (ref 70–99)
Glucose-Capillary: 56 mg/dL — ABNORMAL LOW (ref 70–99)
Glucose-Capillary: 69 mg/dL — ABNORMAL LOW (ref 70–99)
Glucose-Capillary: 73 mg/dL (ref 70–99)
Glucose-Capillary: 87 mg/dL (ref 70–99)
Glucose-Capillary: 89 mg/dL (ref 70–99)
Glucose-Capillary: 91 mg/dL (ref 70–99)
Glucose-Capillary: 93 mg/dL (ref 70–99)
Glucose-Capillary: <10 mg/dL — CL (ref 70–99)

## 2019-01-03 LAB — COMPREHENSIVE METABOLIC PANEL
ALT: 29 U/L (ref 0–44)
AST: 19 U/L (ref 15–41)
Albumin: 2.2 g/dL — ABNORMAL LOW (ref 3.5–5.0)
Alkaline Phosphatase: 226 U/L — ABNORMAL HIGH (ref 38–126)
Anion gap: 14 (ref 5–15)
BUN: 58 mg/dL — ABNORMAL HIGH (ref 8–23)
CO2: 25 mmol/L (ref 22–32)
Calcium: 7.2 mg/dL — ABNORMAL LOW (ref 8.9–10.3)
Chloride: 95 mmol/L — ABNORMAL LOW (ref 98–111)
Creatinine, Ser: 6.1 mg/dL — ABNORMAL HIGH (ref 0.44–1.00)
GFR calc Af Amer: 7 mL/min — ABNORMAL LOW (ref >60)
GFR calc non Af Amer: 6 mL/min — ABNORMAL LOW (ref >60)
Glucose, Bld: 23 mg/dL — CL (ref 70–99)
Potassium: 5 mmol/L (ref 3.5–5.1)
Sodium: 134 mmol/L — ABNORMAL LOW (ref 135–145)
Total Bilirubin: 1.3 mg/dL — ABNORMAL HIGH (ref 0.3–1.2)
Total Protein: 6 g/dL — ABNORMAL LOW (ref 6.5–8.1)

## 2019-01-03 LAB — URINALYSIS, COMPLETE (UACMP) WITH MICROSCOPIC
Bacteria, UA: NONE SEEN
RBC / HPF: >50 RBC/hpf — ABNORMAL HIGH (ref 0–5)
Specific Gravity, Urine: 1.023 (ref 1.005–1.030)
Squamous Epithelial / HPF: NONE SEEN (ref 0–5)
WBC, UA: >50 WBC/hpf — ABNORMAL HIGH (ref 0–5)

## 2019-01-03 LAB — LACTIC ACID, PLASMA: Lactic Acid, Venous: 1.6 mmol/L (ref 0.5–1.9)

## 2019-01-03 LAB — APTT: aPTT: 39 seconds — ABNORMAL HIGH (ref 24–36)

## 2019-01-03 LAB — RESPIRATORY PANEL BY RT PCR (FLU A&B, COVID)
Influenza A by PCR: NEGATIVE
Influenza B by PCR: NEGATIVE
SARS Coronavirus 2 by RT PCR: NEGATIVE

## 2019-01-03 LAB — PROCALCITONIN: Procalcitonin: 5.59 ng/mL

## 2019-01-03 LAB — PROTIME-INR
INR: 2.1 — ABNORMAL HIGH (ref 0.8–1.2)
Prothrombin Time: 23.1 seconds — ABNORMAL HIGH (ref 11.4–15.2)

## 2019-01-03 LAB — POC SARS CORONAVIRUS 2 AG: SARS Coronavirus 2 Ag: NEGATIVE

## 2019-01-03 MED ORDER — ALTEPLASE 2 MG IJ SOLR
2.0000 mg | Freq: Once | INTRAMUSCULAR | Status: DC | PRN
  Filled 2019-01-03: qty 2

## 2019-01-03 MED ORDER — VANCOMYCIN HCL IN DEXTROSE 1-5 GM/200ML-% IV SOLN: 1000.0000 mg | Freq: Once | INTRAVENOUS | Status: DC

## 2019-01-03 MED ORDER — PENTAFLUOROPROP-TETRAFLUOROETH EX AERO
1.0000 "application " | INHALATION_SPRAY | CUTANEOUS | Status: DC | PRN
  Filled 2019-01-03: qty 30

## 2019-01-03 MED ORDER — LIDOCAINE HCL (PF) 1 % IJ SOLN
5.0000 mL | INTRAMUSCULAR | Status: DC | PRN
  Filled 2019-01-03: qty 5

## 2019-01-03 MED ORDER — HEPARIN SODIUM (PORCINE) 1000 UNIT/ML DIALYSIS
20.0000 [IU]/kg | INTRAMUSCULAR | Status: DC | PRN
  Filled 2019-01-03: qty 2

## 2019-01-03 MED ORDER — ACETAMINOPHEN 650 MG RE SUPP: 650.0000 mg | Freq: Four times a day (QID) | RECTAL | Status: DC | PRN

## 2019-01-03 MED ORDER — HEPARIN SODIUM (PORCINE) 1000 UNIT/ML DIALYSIS
1000.0000 [IU] | INTRAMUSCULAR | Status: DC | PRN
  Filled 2019-01-03: qty 1

## 2019-01-03 MED ORDER — DEXTROSE 5 % IV SOLN: Freq: Once | INTRAVENOUS | Status: AC

## 2019-01-03 MED ORDER — METRONIDAZOLE IN NACL 5-0.79 MG/ML-% IV SOLN
500.0000 mg | Freq: Once | INTRAVENOUS | Status: AC
  Administered 2019-01-03: 500 mg via INTRAVENOUS
  Filled 2019-01-03: qty 100

## 2019-01-03 MED ORDER — ONDANSETRON HCL 4 MG PO TABS: 4.0000 mg | ORAL_TABLET | Freq: Four times a day (QID) | ORAL | Status: DC | PRN

## 2019-01-03 MED ORDER — DEXTROSE 50 % IV SOLN
50.0000 mL | INTRAVENOUS | Status: DC | PRN
  Administered 2019-01-03 – 2019-01-05 (×11): 50 mL via INTRAVENOUS
  Filled 2019-01-03 (×11): qty 50

## 2019-01-03 MED ORDER — SODIUM CHLORIDE 0.9 % IV SOLN: 100.0000 mL | INTRAVENOUS | Status: DC | PRN

## 2019-01-03 MED ORDER — WARFARIN - PHARMACIST DOSING INPATIENT
Freq: Every day | Status: DC
  Filled 2019-01-03: qty 1

## 2019-01-03 MED ORDER — SODIUM CHLORIDE 0.9 % IV SOLN
2.0000 g | Freq: Once | INTRAVENOUS | Status: AC
  Administered 2019-01-03: 2 g via INTRAVENOUS
  Filled 2019-01-03 (×2): qty 2

## 2019-01-03 MED ORDER — VANCOMYCIN HCL 1500 MG/300ML IV SOLN: 1500.0000 mg | Freq: Once | INTRAVENOUS | Status: DC

## 2019-01-03 MED ORDER — VANCOMYCIN HCL 500 MG IV SOLR
500.0000 mg | Freq: Once | INTRAVENOUS | Status: AC
  Administered 2019-01-03: 500 mg via INTRAVENOUS
  Filled 2019-01-03: qty 500

## 2019-01-03 MED ORDER — DEXTROSE 50 % IV SOLN
1.0000 | Freq: Once | INTRAVENOUS | Status: AC
  Administered 2019-01-03: 50 mL via INTRAVENOUS

## 2019-01-03 MED ORDER — WARFARIN SODIUM 2 MG PO TABS
2.0000 mg | ORAL_TABLET | Freq: Once | ORAL | Status: DC
  Filled 2019-01-03: qty 1

## 2019-01-03 MED ORDER — VANCOMYCIN HCL IN DEXTROSE 1-5 GM/200ML-% IV SOLN
1000.0000 mg | Freq: Once | INTRAVENOUS | Status: AC
  Administered 2019-01-03: 1000 mg via INTRAVENOUS
  Filled 2019-01-03: qty 200

## 2019-01-03 MED ORDER — DEXTROSE 5 % IV SOLN
0.5000 g | Freq: Two times a day (BID) | INTRAVENOUS | Status: DC
  Administered 2019-01-03 – 2019-01-06 (×7): 0.5 g via INTRAVENOUS
  Filled 2019-01-03 (×11): qty 0.5

## 2019-01-03 MED ORDER — VANCOMYCIN HCL 500 MG/100ML IV SOLN
500.0000 mg | Freq: Once | INTRAVENOUS | Status: DC
  Filled 2019-01-03: qty 100

## 2019-01-03 MED ORDER — ONDANSETRON HCL 4 MG/2ML IJ SOLN
4.0000 mg | Freq: Four times a day (QID) | INTRAMUSCULAR | Status: DC | PRN
  Administered 2019-01-06: 4 mg via INTRAVENOUS
  Filled 2019-01-03 (×2): qty 2

## 2019-01-03 MED ORDER — LIDOCAINE-PRILOCAINE 2.5-2.5 % EX CREA
1.0000 "application " | TOPICAL_CREAM | CUTANEOUS | Status: DC | PRN
  Filled 2019-01-03: qty 5

## 2019-01-03 MED ORDER — CHLORHEXIDINE GLUCONATE CLOTH 2 % EX PADS
6.0000 | MEDICATED_PAD | Freq: Every day | CUTANEOUS | Status: DC
  Administered 2019-01-04: 6 via TOPICAL
  Filled 2019-01-03: qty 6

## 2019-01-03 MED ORDER — METOPROLOL TARTRATE 5 MG/5ML IV SOLN: 2.5000 mg | INTRAVENOUS | Status: DC | PRN

## 2019-01-03 MED ORDER — DEXTROSE 10 % IV SOLN: INTRAVENOUS | Status: DC

## 2019-01-03 MED ORDER — ACETAMINOPHEN 325 MG PO TABS
650.0000 mg | ORAL_TABLET | Freq: Four times a day (QID) | ORAL | Status: DC | PRN
  Administered 2019-01-06 – 2019-01-07 (×2): 650 mg via ORAL
  Filled 2019-01-03 (×2): qty 2

## 2019-01-03 NOTE — Progress Notes (Signed)
CODE SEPSIS - PHARMACY COMMUNICATION  **Broad Spectrum Antibiotics should be administered within 1 hour of Sepsis diagnosis**  Time Code Sepsis Called/Page Received: 1007  Antibiotics Ordered: Aztreonam/Vancomycin/Metronidzole   Time of 1st antibiotic administration: 0803 Vancomycin. 0915 Aztreonam    Additional action taken by pharmacy: Tubed Aztreonam 2g IV to ED. (aztreonam is in Pyxis)  If necessary, Name of Provider/Nurse Contacted: Janna Arch ,PharmD Clinical Pharmacist  01/03/2019  8:25 AM

## 2019-01-03 NOTE — ED Notes (Signed)
Verbal order from Niu MD to increase D10 infusion from 61mL/hr to 7mL/hr. This RN set d10 infusion to 46mL/hr at this time.

## 2019-01-03 NOTE — Consult Note (Addendum)
Pharmacy Antibiotic Note  Danielle Rowland is a 78 y.o. female with medical history of atrial fibrillation on warfarin, type 2 diabetes, cirrhosis admitted on 01/03/2019 with sepsis. Patient presented to [facility] ED 12/20 from [facility] with chief complaint of apnea and hypoglycemia. Vitals significant for hypothermia. CBC with leukocytosis- hx of CLL per chart. Patient does have a history of asplenia making her at risk for encapsulated organisms. She was recently admitted at [facility] 12/9-12/18 with gastroenteritis and AKI. Pharmacy has been consulted for vancomycin and aztreonam dosing.  Patient has received one time doses of aztreonam, metronidazole, and vancomycin 1g in the ED. Labs significant for a creatinine of 6.10. Patient was receiving dialysis last admission and appears she continued this out-patient.   Plan: Aztreonam 500 mg q12h (dosed for HD)  Vancomycin 500 mg x 1 to complete LD (1000 already given + 500 mg) + and then 1000 mg qHD  Continue to follow nephrology plan, culture results.   Height: 5' (152.4 cm) Weight: 206 lb 9.1 oz (93.7 kg) IBW/kg (Calculated) : 45.5  Temp (24hrs), Avg:93.3 F (34.1 C), Min:93.3 F (34.1 C), Max:93.3 F (34.1 C)  Recent Labs  Lab 12/28/18 0441 12/29/18 0800 12/30/18 0418 12/31/18 0620 01/01/19 0827 01/03/19 0719  WBC 13.0* 11.9* 14.1*  --   --  18.0*  CREATININE 14.07* 12.30* 9.60* 7.51* 6.06* 6.10*  LATICACIDVEN  --   --   --   --   --  1.6    Estimated Creatinine Clearance: 7.9 mL/min (A) (by C-G formula based on SCr of 6.1 mg/dL (H)).    Allergies  Allergen Reactions  . Influenza Vaccines   . Iodinated Diagnostic Agents     IVP Dye  . Metformin Hcl Nausea Only and Nausea And Vomiting  . Penicillins   . Sulfa Antibiotics     Antimicrobials this admission: Metronidazole 12/20 x1 Vancomycin 12/20 >> Aztreonam 12/20 >>   Dose adjustments this admission: N/A  Microbiology results: 12/20 BCx: pending 12/20 UCx:  pending  12/20 SARS-CoV-2/Influenza A/B: negative 12/13 Ucx: 80k colonies/mL E faecalis (pan-sensitive) 12/9 Ucx: E coli + E faecalis  Thank you for allowing pharmacy to be a part of this patient's care.  [place] Resident 01/03/2019 10:02 AM

## 2019-01-03 NOTE — ED Notes (Signed)
DNR arm bracelet placed on patient.

## 2019-01-03 NOTE — Progress Notes (Signed)
This note also relates to the following rows which could not be included: Pulse Rate - Cannot attach notes to unvalidated device data Resp - Cannot attach notes to unvalidated device data SpO2 - Cannot attach notes to unvalidated device data  Pt stable continue to monitor
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
## 2019-01-03 NOTE — Progress Notes (Signed)
   01/03/19 2337  Neurological  Level of Consciousness Responds to Voice  Orientation Level Oriented to person  Respiratory  Respiratory Pattern Irregular  Bilateral Breath Sounds Diminished  Cardiac  Pulse Irregular  Heart Sounds S1, S2  ECG Monitor Yes  tolerated HD TX well stable for d/c

## 2019-01-03 NOTE — ED Notes (Signed)
MD Blaine Hamper aware of pt's blood sugar. 1 amp of dextrose given by this rn. This RN will recheck sugar in 15 per MD niu verbal order.

## 2019-01-03 NOTE — Consult Note (Signed)
ANTICOAGULATION CONSULT NOTE - Initial Consult  Pharmacy Consult for Warfarin Indication: atrial fibrillation  Allergies  Allergen Reactions  . Influenza Vaccines   . Iodinated Diagnostic Agents     IVP Dye  . Metformin Hcl Nausea Only and Nausea And Vomiting  . Penicillins   . Sulfa Antibiotics     Patient Measurements: Height: 5' (152.4 cm) Weight: 206 lb 9.1 oz (93.7 kg) IBW/kg (Calculated) : 45.5  Vital Signs: Temp: 93.3 F (34.1 C) (12/20 0728) Temp Source: Rectal (12/20 0728) BP: 100/72 (12/20 1115) Pulse Rate: 96 (12/20 1115)  Labs: Recent Labs    01/01/19 0827 01/03/19 0719  HGB  --  14.1  HCT  --  42.0  PLT  --  193  APTT  --  39*  LABPROT 23.2* 23.1*  INR 2.1* 2.1*  CREATININE 6.06* 6.10*    Estimated Creatinine Clearance: 7.9 mL/min (A) (by C-G formula based on SCr of 6.1 mg/dL (H)).   Medical History: Past Medical History:  Diagnosis Date  . Erythrocytosis 06/05/2014  . History of radiation therapy   . Leukocytosis 06/05/2014  . Rectal cancer ([place]) 05/2002    Medications:  Warfarin 2 mg daily at 6 pm last out-patient dose 12/19 per nursing home [facility]  Assessment: Patient is a 78 y/o female with history of atrial fibrillation on warfarin, type 2 diabetes mellitus, hypertension, AKI on CKD on HD, cirrhosis, colon cancer s/p chemotherapy and resection who presented to [facility] ED 12/20 with chief complaint of apnea and hypoglycemia. She was found to be septic and started on broad spectrum antibiotics. Pharmacy has been consulted to continue home warfarin.   Hemoglobin 14.1. Platelets 193.    Goal of Therapy:  INR 2-3   Plan:  -INR today 2.1, therapeutic. Will continue home warfarin regimen of 2 mg daily.  -Daily INR while on warfarin -[place] Resident 01/03/2019,12:09 PM

## 2019-01-03 NOTE — Consult Note (Signed)
Danielle Rowland MRN: [id number] DOB/AGE: 07-25-1940 78 y.o. Primary Care Physician:Fisher, Kirstie Peri, MD Admit date: 01/03/2019 Chief Complaint:  Chief Complaint  Patient presents with  . Altered Mental Status   Patient is not able to provide any history  HPI: Patient is a 78 year old Caucasian female with a past medical history of AKI on CKD requiring renal replacement therapy, diabetes mellitus, atrial fibrillation, hypertension who was brought to the ER with chief complaint of altered mental status .  History of present illness dates back to December 23, 2018 when patient was admitted at [facility] during the hospital stay patient was sepsis and acute kidney injury.  Patient was started on renal placement therapy.  Patient was discharged to rehab on January 02, 2019.   In the rehab patient was noted to have altered mental status and patient was sent to the ER.   As per report [facility].   Patient was found to be  unresponsive this morning.  EMS was called and EMS reports blood sugar 68 with occasional episodes of apnea.    Upon evaluation in the ER patient was found to be hypoglycemic and in fluid overload , Patient blood sugar was 23 initially and repeated test showed that her blood sugar was 10 in ED. Pt was given D50 and D5 gtt, her blood sugar improved to 200 and then decreased to 90s again. Labs done in the ER showed WBC count is high at 18, BNP is high at 2659,  COVID-19 antigen negative, Chest x-ray showed symmetric interstitial coarsening, likely congestive and opacity at the left base with nonvisualized diaphragm, suspecting pleural effusion.   patient was admitted for further treatment.  Patient seen today in the ER Patient remains lethargic, hypothermic and does not offer any complaints   Past Medical History:  Diagnosis Date  . Erythrocytosis 06/05/2014  . History of radiation therapy   . Leukocytosis 06/05/2014  . Rectal cancer ([place]) 05/2002        Family History   Problem Relation Age of Onset  . Stroke Mother   . CAD Mother   . Lung cancer Father   . Prostate cancer Father   . Diabetes Sister   . Hypertension Sister   . Hypothyroidism Sister   . CAD Sister   . Diabetes Sister   . Hypertension Sister   . Hypothyroidism Sister   . Transient ischemic attack Sister   . Hypertension Sister   . Hypertension Sister   . Heart disease Other     Social History:  reports that she has never smoked. She has never used smokeless tobacco. She reports that she does not drink alcohol or use drugs.   Allergies:  Allergies  Allergen Reactions  . Influenza Vaccines   . Iodinated Diagnostic Agents     IVP Dye  . Metformin Hcl Nausea Only and Nausea And Vomiting  . Penicillins   . Sulfa Antibiotics     (Not in a hospital admission)      JJH:ERDEYC to get any data.      Physical Exam: Vital signs in last 24 hours: Temp:  [93.3 F (34.1 C)] 93.3 F (34.1 C) (12/20 0728) Pulse Rate:  [82-117] 82 (12/20 0900) Resp:  [12-34] 18 (12/20 0945) BP: (99-119)/(72-102) 103/88 (12/20 0945) SpO2:  [95 %-99 %] 99 % (12/20 0945) Weight:  [93.7 kg] 93.7 kg (12/20 0743) Weight change:     Intake/Output from previous day: No intake/output data recorded. No intake/output data recorded.   Physical  Exam: General- pt is lethargic does not follow commands resp- No acute REsp distress, decreased breath sounds at bases CVS- S1S2 regular in rate and rhythm GIT- BS+, soft, NT, ND EXT- 2+ LE Edema, Cyanosis Access- PC in situ   Lab Results: CBC Recent Labs    01/03/19 0719  WBC 18.0*  HGB 14.1  HCT 42.0  PLT 193    BMET Recent Labs    01/01/19 0827 01/03/19 0719  NA 136 134*  K 4.5 5.0  CL 95* 95*  CO2 22 25  GLUCOSE 121* 23*  BUN 50* 58*  CREATININE 6.06* 6.10*  CALCIUM 7.4* 7.2*    MICRO Recent Results (from the past 240 hour(s))  Urine Culture     Status: Abnormal   Collection Time: 12/27/18  6:41 AM   Specimen: Urine,  Random  Result Value Ref Range Status   Specimen Description   Final    URINE, RANDOM Performed at [facility], [address] [place], [place] [address]    Special Requests   Final    NONE Performed at [facility], [address] [place], [place] [address]    Culture 80,000 COLONIES/mL ENTEROCOCCUS FAECALIS (A)  Final   Report Status 12/30/2018 FINAL  Final   Organism ID, Bacteria ENTEROCOCCUS FAECALIS (A)  Final      Susceptibility   Enterococcus faecalis - MIC*    AMPICILLIN <=2 SENSITIVE Sensitive     NITROFURANTOIN <=16 SENSITIVE Sensitive     VANCOMYCIN 1 SENSITIVE Sensitive     * 80,000 COLONIES/mL ENTEROCOCCUS FAECALIS  SARS CORONAVIRUS 2 (TAT 6-24 HRS) Nasopharyngeal Nasopharyngeal Swab     Status: None   Collection Time: 12/31/18  2:31 PM   Specimen: Nasopharyngeal Swab  Result Value Ref Range Status   SARS Coronavirus 2 NEGATIVE NEGATIVE Final    Comment: (NOTE) SARS-CoV-2 target nucleic acids are NOT DETECTED. The SARS-CoV-2 RNA is generally detectable in upper and lower respiratory specimens during the acute phase of infection. Negative results do not preclude SARS-CoV-2 infection, do not rule out co-infections with other pathogens, and should not be used as the sole basis for treatment or other patient management decisions. Negative results must be combined with clinical observations, patient history, and epidemiological information. The expected result is Negative. Fact Sheet for Patients: SugarRoll.be Fact Sheet for Healthcare Providers: https://www.woods-mathews.com/ This test is not yet approved or cleared by the [place] FDA and  has been authorized for detection and/or diagnosis of SARS-CoV-2 by FDA under an Emergency Use Authorization (EUA). This EUA will remain  in effect (meaning this test can be used) for the duration of the COVID-19 declaration under Section 56 4(b)(1) of the Act,  21 U.S.C. section 360bbb-3(b)(1), unless the authorization is terminated or revoked sooner. Performed at [facility], [place] [address], [place], [place] [address]   Respiratory Panel by RT PCR (Flu A&B, Covid) - Nasopharyngeal Swab     Status: None   Collection Time: 01/03/19  7:19 AM   Specimen: Nasopharyngeal Swab  Result Value Ref Range Status   SARS Coronavirus 2 by RT PCR NEGATIVE NEGATIVE Final    Comment: (NOTE) SARS-CoV-2 target nucleic acids are NOT DETECTED. The SARS-CoV-2 RNA is generally detectable in upper respiratoy specimens during the acute phase of infection. The lowest concentration of SARS-CoV-2 viral copies this assay can detect is 131 copies/mL. A negative result does not preclude SARS-Cov-2 infection and should not be used as the sole basis for treatment or other  patient management decisions. A negative result may occur with  improper specimen collection/handling, submission of specimen other than nasopharyngeal swab, presence of viral mutation(s) within the areas targeted by this assay, and inadequate number of viral copies (<131 copies/mL). A negative result must be combined with clinical observations, patient history, and epidemiological information. The expected result is Negative. Fact Sheet for Patients:  PinkCheek.be Fact Sheet for Healthcare Providers:  GravelBags.it This test is not yet ap proved or cleared by the [place] FDA and  has been authorized for detection and/or diagnosis of SARS-CoV-2 by FDA under an Emergency Use Authorization (EUA). This EUA will remain  in effect (meaning this test can be used) for the duration of the COVID-19 declaration under Section 564(b)(1) of the Act, 21 U.S.C. section 360bbb-3(b)(1), unless the authorization is terminated or revoked sooner.    Influenza A by PCR NEGATIVE NEGATIVE Final   Influenza B by PCR NEGATIVE NEGATIVE Final    Comment:  (NOTE) The Xpert Xpress SARS-CoV-2/FLU/RSV assay is intended as an aid in  the diagnosis of influenza from Nasopharyngeal swab specimens and  should not be used as a sole basis for treatment. Nasal washings and  aspirates are unacceptable for Xpert Xpress SARS-CoV-2/FLU/RSV  testing. Fact Sheet for Patients: PinkCheek.be Fact Sheet for Healthcare Providers: GravelBags.it This test is not yet approved or cleared by the [place] FDA and  has been authorized for detection and/or diagnosis of SARS-CoV-2 by  FDA under an Emergency Use Authorization (EUA). This EUA will remain  in effect (meaning this test can be used) for the duration of the  Covid-19 declaration under Section 564(b)(1) of the Act, 21  U.S.C. section 360bbb-3(b)(1), unless the authorization is  terminated or revoked. Performed at [facility], [address] [place], [place] [address]       Lab Results  Component Value Date   PTH 517 (H) 12/28/2018   CALCIUM 7.2 (L) 01/03/2019   PHOS 5.4 (H) 01/01/2019      Impression: 1)Renal  AKI secondary to ATN AKI on CKD Patient had CKD stage 2. 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 Range 12/24/2018 05:00 12/30/2018 13:15  Anti Nuclear Antibody (ANA) Latest Ref Range: Negative  Negative   ANCA Proteinase 3 Latest Ref Range: 0.0 - 3.5 U/mL  7.5 (H)   GBM Ab Latest Ref Range: 0 - 20 units  3  Myeloperoxidase Abs Latest Ref Range: 0.0 - 9.0 U/mL  <9.0  Cytoplasmic (C-ANCA) Latest Ref Range: Neg:<1:20 titer <1:20   P-ANCA Latest Ref Range: Neg:<1:20 titer <1:20   Atypical P-ANCA titer Latest Ref Range: Neg:<1:20 titer <1:20    Patient does need renal biopsy but with patient having single functioning kidney this is a high risk procedure especially in the setting of age being more than 78 years old and comorbidities of atrial fibrillation being on a blood thinner.We will  discuss with the patient/family about need/ benefit/ risk of renal biopsy Patient is not a candidate for biopsy at this  time, once stable patient may need open biopsy  2)HTN Blood pressure stable   3)Anemia none  4) secondary hyperparathyroidism CKD Mineral-Bone Disorder Secondary Hyperparathyroidism  Present Phosphorus at goal.   5) hypoglycemia Patient is on D5 This will closely follow the primary team   6) electrolytes   normokalemic   Hyponatremia Most likely secondary to D5 with water  7) sepsis Patient is being admitted with sepsis Patient is on broad-spectrum antibiotics  8) fluid overload Patient will need dialysis to help with the fluid removal     Plan:   We will dialyze patient today We will use low blood flow rate Will use step for the rest profile to help with fluid removal as patient blood pressure is tenuous We will communicate/coordinate with pharmacy about need for Vanco dosing with her dialysis   Klinton Candelas s [place] 01/03/2019, 10:12 AM

## 2019-01-03 NOTE — ED Triage Notes (Signed)
Pt presents from [facility] via [facility] with c/o apnea. apnea periods for 30-45 seconds. Pt is dialysis pt. blood sugar 68. Pt responds to pain ful stimuli at this time. Pt 97% on 4L. MD Kinner at bedside. Pt has DNR on file.

## 2019-01-03 NOTE — Progress Notes (Signed)
   01/03/19 1950  Neurological  Orientation Level Other (comment) (pt unresponsive at present)  Respiratory  Respiratory Pattern Irregular  Bilateral Breath Sounds Diminished  Cardiac  Heart Sounds S1, S2  ECG Monitor Yes  Cardiac Rhythm Atrial fibrillation  Vascular  Generalized Edema +2  LUE Edema +4 (weaping present)  pt unresponsive at present vitals stable CVCWDL UFG 2L

## 2019-01-03 NOTE — ED Notes (Signed)
This RN spoke with ICU charge who states that they are accepting another pt and this pt will not be able to come to the floor at this time. Charge RN aware.

## 2019-01-03 NOTE — ED Notes (Signed)
Pt cleaned and repositioned at this time. New brief placed on pt. In and out performed by this RN and Lonn Georgia, NT. Purewick placed after procedure.

## 2019-01-03 NOTE — Progress Notes (Signed)
CONTINUE TO MONITOR PT STABLE

## 2019-01-03 NOTE — Progress Notes (Signed)
PHARMACY -  BRIEF ANTIBIOTIC NOTE   Pharmacy has received consult(s) for Vancomycin from an ED provider.  The patient's profile has been reviewed for ht/wt/allergies/indication/available labs.    One time order(s) placed for Vancomycin 1g IV x 1.   Patient has no documented history of tolerating cephalosporins. Will continue with Aztreonam 2g IV x 1 as ordered.    Further antibiotics/pharmacy consults should be ordered by admitting physician if indicated.                        Ludmila Ebarb L 01/03/2019  8:21 AM

## 2019-01-03 NOTE — ED Notes (Signed)
Pt is currently on non-rebreather per MD Kinner verbal order. Pt having periods of apnea with snoring respirations. Oxygen saturation 98% on 15L non-rebreather.

## 2019-01-03 NOTE — Progress Notes (Signed)
Notified bedside nurse of need to draw blood cultures.  

## 2019-01-04 DIAGNOSIS — R6521 Severe sepsis with septic shock: Secondary | ICD-10-CM

## 2019-01-04 DIAGNOSIS — R4189 Other symptoms and signs involving cognitive functions and awareness: Secondary | ICD-10-CM

## 2019-01-04 DIAGNOSIS — A419 Sepsis, unspecified organism: Secondary | ICD-10-CM

## 2019-01-04 DIAGNOSIS — R4182 Altered mental status, unspecified: Secondary | ICD-10-CM

## 2019-01-04 LAB — GLUCOSE, CAPILLARY
Glucose-Capillary: 106 mg/dL — ABNORMAL HIGH (ref 70–99)
Glucose-Capillary: 107 mg/dL — ABNORMAL HIGH (ref 70–99)
Glucose-Capillary: 109 mg/dL — ABNORMAL HIGH (ref 70–99)
Glucose-Capillary: 116 mg/dL — ABNORMAL HIGH (ref 70–99)
Glucose-Capillary: 131 mg/dL — ABNORMAL HIGH (ref 70–99)
Glucose-Capillary: 141 mg/dL — ABNORMAL HIGH (ref 70–99)
Glucose-Capillary: 168 mg/dL — ABNORMAL HIGH (ref 70–99)
Glucose-Capillary: 169 mg/dL — ABNORMAL HIGH (ref 70–99)
Glucose-Capillary: 182 mg/dL — ABNORMAL HIGH (ref 70–99)
Glucose-Capillary: 58 mg/dL — ABNORMAL LOW (ref 70–99)
Glucose-Capillary: 61 mg/dL — ABNORMAL LOW (ref 70–99)
Glucose-Capillary: 65 mg/dL — ABNORMAL LOW (ref 70–99)
Glucose-Capillary: 65 mg/dL — ABNORMAL LOW (ref 70–99)
Glucose-Capillary: 65 mg/dL — ABNORMAL LOW (ref 70–99)
Glucose-Capillary: 67 mg/dL — ABNORMAL LOW (ref 70–99)
Glucose-Capillary: 67 mg/dL — ABNORMAL LOW (ref 70–99)
Glucose-Capillary: 68 mg/dL — ABNORMAL LOW (ref 70–99)
Glucose-Capillary: 68 mg/dL — ABNORMAL LOW (ref 70–99)
Glucose-Capillary: 69 mg/dL — ABNORMAL LOW (ref 70–99)
Glucose-Capillary: 75 mg/dL (ref 70–99)
Glucose-Capillary: 78 mg/dL (ref 70–99)
Glucose-Capillary: 90 mg/dL (ref 70–99)
Glucose-Capillary: 92 mg/dL (ref 70–99)
Glucose-Capillary: 93 mg/dL (ref 70–99)

## 2019-01-04 LAB — BASIC METABOLIC PANEL
Anion gap: 11 (ref 5–15)
BUN: 38 mg/dL — ABNORMAL HIGH (ref 8–23)
CO2: 25 mmol/L (ref 22–32)
Calcium: 6.6 mg/dL — ABNORMAL LOW (ref 8.9–10.3)
Chloride: 98 mmol/L (ref 98–111)
Creatinine, Ser: 4.4 mg/dL — ABNORMAL HIGH (ref 0.44–1.00)
GFR calc Af Amer: 10 mL/min — ABNORMAL LOW (ref >60)
GFR calc non Af Amer: 9 mL/min — ABNORMAL LOW (ref >60)
Glucose, Bld: 110 mg/dL — ABNORMAL HIGH (ref 70–99)
Potassium: 3.9 mmol/L (ref 3.5–5.1)
Sodium: 134 mmol/L — ABNORMAL LOW (ref 135–145)

## 2019-01-04 LAB — CBC
HCT: 36.3 % (ref 36.0–46.0)
Hemoglobin: 11.6 g/dL — ABNORMAL LOW (ref 12.0–15.0)
MCH: 30.2 pg (ref 26.0–34.0)
MCHC: 32 g/dL (ref 30.0–36.0)
MCV: 94.5 fL (ref 80.0–100.0)
Platelets: 166 10*3/uL (ref 150–400)
RBC: 3.84 MIL/uL — ABNORMAL LOW (ref 3.87–5.11)
RDW: 15.1 % (ref 11.5–15.5)
WBC: 16.4 10*3/uL — ABNORMAL HIGH (ref 4.0–10.5)
nRBC: 5.3 % — ABNORMAL HIGH (ref 0.0–0.2)

## 2019-01-04 LAB — LACTIC ACID, PLASMA: Lactic Acid, Venous: 1.8 mmol/L (ref 0.5–1.9)

## 2019-01-04 LAB — PROTIME-INR
INR: 2.8 — ABNORMAL HIGH (ref 0.8–1.2)
Prothrombin Time: 29.1 seconds — ABNORMAL HIGH (ref 11.4–15.2)

## 2019-01-04 LAB — MRSA PCR SCREENING: MRSA by PCR: NEGATIVE

## 2019-01-04 LAB — URINE CULTURE: Culture: NO GROWTH

## 2019-01-04 LAB — PATHOLOGIST SMEAR REVIEW

## 2019-01-04 LAB — AMMONIA: Ammonia: 26 umol/L (ref 9–35)

## 2019-01-04 MED ORDER — HYDROCORTISONE NA SUCCINATE PF 100 MG IJ SOLR
50.0000 mg | Freq: Four times a day (QID) | INTRAMUSCULAR | Status: DC
  Administered 2019-01-04 – 2019-01-07 (×11): 50 mg via INTRAVENOUS
  Filled 2019-01-04: qty 1
  Filled 2019-01-04: qty 2
  Filled 2019-01-04 (×3): qty 1
  Filled 2019-01-04: qty 2
  Filled 2019-01-04 (×3): qty 1
  Filled 2019-01-04 (×4): qty 2

## 2019-01-04 MED ORDER — MIDODRINE HCL 5 MG PO TABS
10.0000 mg | ORAL_TABLET | Freq: Three times a day (TID) | ORAL | Status: DC
  Administered 2019-01-04 – 2019-01-06 (×6): 10 mg via ORAL
  Filled 2019-01-04 (×10): qty 2

## 2019-01-04 MED ORDER — GLUCAGON HCL RDNA (DIAGNOSTIC) 1 MG IJ SOLR: 1.0000 mg | Freq: Once | INTRAMUSCULAR | Status: DC | PRN

## 2019-01-04 MED ORDER — MIDODRINE HCL 5 MG PO TABS
10.0000 mg | ORAL_TABLET | Freq: Once | ORAL | Status: AC
  Administered 2019-01-04: 10 mg via ORAL
  Filled 2019-01-04: qty 2

## 2019-01-04 MED ORDER — ALBUMIN HUMAN 25 % IV SOLN
12.5000 g | Freq: Once | INTRAVENOUS | Status: AC
  Administered 2019-01-04: 12.5 g via INTRAVENOUS
  Filled 2019-01-04: qty 50

## 2019-01-04 MED ORDER — ALBUMIN HUMAN 25 % IV SOLN
25.0000 g | Freq: Four times a day (QID) | INTRAVENOUS | Status: DC | PRN
  Administered 2019-01-04 – 2019-01-05 (×3): 25 g via INTRAVENOUS
  Filled 2019-01-04 (×2): qty 50
  Filled 2019-01-04 (×2): qty 100
  Filled 2019-01-04 (×2): qty 50

## 2019-01-04 MED ORDER — ALBUMIN HUMAN 5 % IV SOLN
25.0000 g | Freq: Four times a day (QID) | INTRAVENOUS | Status: DC | PRN
  Filled 2019-01-04: qty 500

## 2019-01-04 MED ORDER — SODIUM CHLORIDE 0.9 % IV SOLN
0.0000 ug/min | INTRAVENOUS | Status: DC
  Administered 2019-01-04: 20 ug/min via INTRAVENOUS
  Filled 2019-01-04: qty 1

## 2019-01-04 MED ORDER — DEXTROSE 50 % IV SOLN
1.0000 | Freq: Once | INTRAVENOUS | Status: AC
  Administered 2019-01-06: 50 mL via INTRAVENOUS
  Filled 2019-01-04: qty 50

## 2019-01-04 MED ORDER — WARFARIN SODIUM 2 MG PO TABS
2.0000 mg | ORAL_TABLET | Freq: Once | ORAL | Status: DC
  Filled 2019-01-04: qty 1
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
## 2019-01-04 NOTE — Progress Notes (Signed)
Patient had a blood pressure of 71/48 MAP 56. Repeat Blood pressure was 83/53 MAP 62. Patient is S/p HD about 30 minutes. CBG was 53 and now 168 S/p amp of D50. NP Hassan Rowan made aware and ordered to recheck blood pressure in 20 minutes.

## 2019-01-04 NOTE — Progress Notes (Signed)
Hypoglycemic Event  CBG: 68  Treatment: D50 50 mL (25 gm)  Symptoms: None  Follow-up CBG: Time:0735 CBG Result: 116  Possible Reasons for Event: Unknown  Comments/MD notified: Dr. Georgena Spurling, Conan Bowens

## 2019-01-04 NOTE — Progress Notes (Signed)
[facility]  ROUNDING NOTE   Subjective:   Emergent hemodialysis treatment last night. UF 738mL.   D10 infusion at 46mL/hr  Tmin 96.4  Lethargic and slow to respond.   Objective:  Vital signs in last 24 hours:  Temp:  [96.4 F (35.8 C)-98.2 F (36.8 C)] 97.9 F (36.6 C) (12/21 0430) Pulse Rate:  [32-145] 68 (12/21 0430) Resp:  [13-22] 18 (12/21 0430) BP: (67-104)/(42-79) 90/52 (12/21 0430) SpO2:  [93 %-100 %] 99 % (12/21 0430) Weight:  [105.9 kg-106.7 kg] 106.7 kg (12/21 0500)  Weight change:  Filed Weights   01/03/19 0743 01/03/19 1853 01/04/19 0500  Weight: 93.7 kg 105.9 kg 106.7 kg    Intake/Output: I/O last 3 completed shifts: In: 605 [I.V.:495; IV Piggyback:110] Out: 700 [Other:700]   Intake/Output this shift:  No intake/output data recorded.  Physical Exam: General: Ill appearing  Head: Normocephalic, atraumatic.   Eyes: Anicteric, PERRL  Neck: Supple, trachea midline  Lungs:  Clear to auscultation  Heart: irregular  Abdomen:  Soft, nontender, obese  Extremities:  + peripheral edema.  Neurologic: Slow to respond  Skin: No lesions  Access: LIJ permcath    Basic Metabolic Panel: Recent Labs  Lab 12/29/18 0800 12/30/18 0418 12/31/18 0620 01/01/19 0827 01/03/19 0719 01/04/19 0409  NA 128* 133* 133* 136 134* 134*  K 6.1* 4.8 4.4 4.5 5.0 3.9  CL 88* 92* 92* 95* 95* 98  CO2 17* 21* 21* 22 25 25   GLUCOSE 137* 185* 105* 121* 23* 110*  BUN 122* 86* 67* 50* 58* 38*  CREATININE 12.30* 9.60* 7.51* 6.06* 6.10* 4.40*  CALCIUM 5.6* 5.9* 6.6* 7.4* 7.2* 6.6*  PHOS 10.9* 8.2* 6.5* 5.4*  --   --     Liver Function Tests: Recent Labs  Lab 12/29/18 0800 12/30/18 0418 12/31/18 0620 01/01/19 0827 01/03/19 0719  AST  --   --   --   --  19  ALT  --   --   --   --  29  ALKPHOS  --   --   --   --  226*  BILITOT  --   --   --   --  1.3*  PROT  --   --   --   --  6.0*  ALBUMIN 2.2* 2.2* 2.3* 2.2* 2.2*   No results for input(s): LIPASE,  AMYLASE in the last 168 hours. No results for input(s): AMMONIA in the last 168 hours.  CBC: Recent Labs  Lab 12/29/18 0800 12/30/18 0418 01/03/19 0719 01/04/19 0409  WBC 11.9* 14.1* 18.0* 16.4*  NEUTROABS  --   --  16.0*  --   HGB 13.9 13.8 14.1 11.6*  HCT 39.0 37.5 42.0 36.3  MCV 87.2 83.9 90.9 94.5  PLT 294 270 193 166    Cardiac Enzymes: No results for input(s): CKTOTAL, CKMB, CKMBINDEX, TROPONINI in the last 168 hours.  BNP: Invalid input(s): POCBNP  CBG: Recent Labs  Lab 01/04/19 0709 01/04/19 0733 01/04/19 0907 01/04/19 0926 01/04/19 1044  GLUCAP 61* 116* 69* 169* 90    Microbiology: Results for orders placed or performed during the hospital encounter of 01/03/19  Blood Culture (routine x 2)     Status: None (Preliminary result)   Collection Time: 01/03/19  7:19 AM   Specimen: BLOOD  Result Value Ref Range Status   Specimen Description BLOOD LAC  Final   Special Requests   Final    BOTTLES DRAWN AEROBIC AND ANAEROBIC Blood Culture adequate volume  Culture   Final    NO GROWTH < 24 HOURS Performed at [facility], [address] [place], [place] [address]    Report Status PENDING  Incomplete  Blood Culture (routine x 2)     Status: None (Preliminary result)   Collection Time: 01/03/19  7:19 AM   Specimen: BLOOD  Result Value Ref Range Status   Specimen Description BLOOD LH  Final   Special Requests   Final    BOTTLES DRAWN AEROBIC AND ANAEROBIC Blood Culture adequate volume   Culture   Final    NO GROWTH < 24 HOURS Performed at [facility], [address] [place], [place] [address]    Report Status PENDING  Incomplete  Respiratory Panel by RT PCR (Flu A&B, Covid) - Nasopharyngeal Swab     Status: None   Collection Time: 01/03/19  7:19 AM   Specimen: Nasopharyngeal Swab  Result Value Ref Range Status   SARS Coronavirus 2 by RT PCR NEGATIVE NEGATIVE Final    Comment: (NOTE) SARS-CoV-2 target nucleic acids are NOT  DETECTED. The SARS-CoV-2 RNA is generally detectable in upper respiratoy specimens during the acute phase of infection. The lowest concentration of SARS-CoV-2 viral copies this assay can detect is 131 copies/mL. A negative result does not preclude SARS-Cov-2 infection and should not be used as the sole basis for treatment or other patient management decisions. A negative result may occur with  improper specimen collection/handling, submission of specimen other than nasopharyngeal swab, presence of viral mutation(s) within the areas targeted by this assay, and inadequate number of viral copies (<131 copies/mL). A negative result must be combined with clinical observations, patient history, and epidemiological information. The expected result is Negative. Fact Sheet for Patients:  PinkCheek.be Fact Sheet for Healthcare Providers:  GravelBags.it This test is not yet ap proved or cleared by the [place] FDA and  has been authorized for detection and/or diagnosis of SARS-CoV-2 by FDA under an Emergency Use Authorization (EUA). This EUA will remain  in effect (meaning this test can be used) for the duration of the COVID-19 declaration under Section 564(b)(1) of the Act, 21 U.S.C. section 360bbb-3(b)(1), unless the authorization is terminated or revoked sooner.    Influenza A by PCR NEGATIVE NEGATIVE Final   Influenza B by PCR NEGATIVE NEGATIVE Final    Comment: (NOTE) The Xpert Xpress SARS-CoV-2/FLU/RSV assay is intended as an aid in  the diagnosis of influenza from Nasopharyngeal swab specimens and  should not be used as a sole basis for treatment. Nasal washings and  aspirates are unacceptable for Xpert Xpress SARS-CoV-2/FLU/RSV  testing. Fact Sheet for Patients: PinkCheek.be Fact Sheet for Healthcare Providers: GravelBags.it This test is not yet approved or cleared  by the [place] FDA and  has been authorized for detection and/or diagnosis of SARS-CoV-2 by  FDA under an Emergency Use Authorization (EUA). This EUA will remain  in effect (meaning this test can be used) for the duration of the  Covid-19 declaration under Section 564(b)(1) of the Act, 21  U.S.C. section 360bbb-3(b)(1), unless the authorization is  terminated or revoked. Performed at [facility], [address] [place], [place] [address]   Urine culture     Status: None   Collection Time: 01/03/19  7:40 AM   Specimen: In/Out Cath Urine  Result Value Ref Range Status   Specimen Description   Final    IN/OUT CATH URINE Performed at [facility], [address], [place], [place] [address]  Special Requests   Final    NONE Performed at [facility], [address], [place], [place] [address]    Culture   Final    NO GROWTH Performed at [facility], [place] [address], [place], [place] [address]    Report Status 01/04/2019 FINAL  Final  MRSA PCR Screening     Status: None   Collection Time: 01/04/19  1:42 AM   Specimen: Nasal Mucosa; Nasopharyngeal  Result Value Ref Range Status   MRSA by PCR NEGATIVE NEGATIVE Final    Comment:        The GeneXpert MRSA Assay (FDA approved for NASAL specimens only), is one component of a comprehensive MRSA colonization surveillance program. It is not intended to diagnose MRSA infection nor to guide or monitor treatment for MRSA infections. Performed at [facility], [address] [place],  [address]     Coagulation Studies: Recent Labs    01/03/19 0719 01/04/19 0409  LABPROT 23.1* 29.1*  INR 2.1* 2.8*    Urinalysis: Recent Labs    01/03/19 0748  COLORURINE BROWN*  LABSPEC 1.023  PHURINE TEST NOT REPORTED DUE TO COLOR INTERFERENCE OF URINE PIGMENT  GLUCOSEU TEST NOT REPORTED DUE TO COLOR INTERFERENCE OF URINE PIGMENT*  HGBUR TEST NOT REPORTED DUE TO COLOR  INTERFERENCE OF URINE PIGMENT*  BILIRUBINUR TEST NOT REPORTED DUE TO COLOR INTERFERENCE OF URINE PIGMENT*  KETONESUR TEST NOT REPORTED DUE TO COLOR INTERFERENCE OF URINE PIGMENT*  PROTEINUR TEST NOT REPORTED DUE TO COLOR INTERFERENCE OF URINE PIGMENT*  NITRITE TEST NOT REPORTED DUE TO COLOR INTERFERENCE OF URINE PIGMENT*  LEUKOCYTESUR TEST NOT REPORTED DUE TO COLOR INTERFERENCE OF URINE PIGMENT*      Imaging: CT Head Wo Contrast  Result Date: 01/03/2019 CLINICAL DATA:  Unresponsive, altered mental status. EXAM: CT HEAD WITHOUT CONTRAST TECHNIQUE: Contiguous axial images were obtained from the base of the skull through the vertex without intravenous contrast. COMPARISON:  None. FINDINGS: Brain: No evidence of acute infarction, hemorrhage, hydrocephalus, extra-axial collection or mass lesion/mass effect. Signs of atrophy and chronic microvascular ischemic change. Vascular: No hyperdense vessel or unexpected calcification. Skull: Normal. Negative for fracture or focal lesion. Sinuses/Orbits: Visualized sinuses and orbits are unremarkable. Other: None IMPRESSION: 1. No acute intracranial abnormality. 2. Signs of atrophy and chronic microvascular ischemic change. Electronically Signed   By: Zetta Bills M.D.   On: 01/03/2019 09:12   DG Chest Port 1 View  Result Date: 01/03/2019 CLINICAL DATA:  Shortness of breath EXAM: PORTABLE CHEST 1 VIEW COMPARISON:  12/23/2018 FINDINGS: Chronic cardiomegaly. Atherosclerotic calcification. Mediastinal contours are distorted by leftward rotation. Dialysis catheter from the left with tip at the right atrium. Right-sided port in stable position. Diffuse interstitial coarsening, likely congestive. Nonvisualized left diaphragm where there could be pulmonary opacity or pleural effusion. No pneumothorax. IMPRESSION: Symmetric interstitial coarsening, likely congestive. There is opacity at the left base with nonvisualized diaphragm, suspect pleural effusion. Electronically  Signed   By: Monte Fantasia M.D.   On: 01/03/2019 08:18     Medications:   . sodium chloride    . sodium chloride    . albumin human 25 g (01/04/19 0321)  . aztreonam 0.5 g (01/03/19 2355)  . dextrose 75 mL/hr at 01/04/19 0859   . Chlorhexidine Gluconate Cloth  6 each Topical Q0600  . dextrose  1 ampule Intravenous Once  . warfarin  2 mg Oral ONCE-1800  . Warfarin - Pharmacist Dosing Inpatient   Does not apply [phone]  sodium chloride, sodium chloride, acetaminophen **OR** acetaminophen, albumin human, alteplase, dextrose, heparin, heparin, lidocaine (PF), lidocaine-prilocaine, metoprolol tartrate, ondansetron **OR** ondansetron (ZOFRAN) IV, pentafluoroprop-tetrafluoroeth  Assessment/ Plan:  Ms. [place] Kirchner is a 78 y.o. white female with acute renal failure requiring hemodialysis, diabetes mellitus type II, atrial fibrillation, hypertension, admitted on 01/03/2019 with Acute pulmonary edema ([place]) [J81.0] Peripheral edema [R60.9] Hypoglycemia [E16.2] Unresponsiveness [R41.89] Altered mental status, unspecified altered mental status type [R41.82]  Discharged from [facility] on 12/19 to [place] [facility] [place]  1. Acute Kidney Failure requiring outpatient hemodialysis: emergent hemodialysis treatment last night.  Baseline creatinine of 0.89 on 04/15/2018. Serologic work up negative on 12/30/18.  Chronic obstructive uropathy on ultrasound on 12/9 No indication for dialysis today. Monitor daily for dialysis need.  - repeat urine studies  2. Hypertension: hypotensive  - placed on midodrine - holding metoprolol  3. Anemia with chronic kidney disease: hemoglobin stable at 11.6  4. Secondary Hyperparathyroidism with hypocalcemia: PTH 517.  - Check phos levels  5. Altered mental status - Consult neurology.    LOS: 1 Alvilda Mckenna 12/21/202011:05 AM

## 2019-01-04 NOTE — Progress Notes (Signed)
Hypoglycemic Event  CBG: 68  Treatment: D50 50 mL (25 gm)  Symptoms: None  Follow-up CBG: [id number] CBG Result:106  Possible Reasons for Event: Unknown  Comments/MD notified: Randol Kern NP    Gerhard Perches

## 2019-01-04 NOTE — Progress Notes (Signed)
PROGRESS NOTE    Danielle Rowland  ENI:[id number] DOB: Apr 19, 1940 DOA: 01/03/2019 PCP: Birdie Sons, MD    Brief Narrative:Danielle Rowland is a 78 y.o. female with medical history significant of ESRD-HD, hypertension, diabetes mellitus, rectal cancer (resection:), CLL, splenectomy, liver cirrhosis, who presents with unresponsiveness.  Patient has AMS, and is unable to provide medical history, therefore, most of the history is obtained by discussing the case with ED physician, per EMS report, and with the nursing staff.  Per report, pt is from [facility]. She was found to be unresponsive this morning. Pt is a new resident at [facility] only there proximately 24 hours per report. EMS reports blood sugar 68 and pt was occasional episodes of apnea. Her blood sugar was 23 initially and repeated test showed that her blood sugar was 10 in ED. Pt was give D50 and D5 gtt, her blood sugar improved to 200 and then decreased to 90s again. When I saw pt in ED, she is minimally responsive. She moves all extremities upon painful stimuli.  No active cough, nausea, vomiting, diarrhea noted.  Not sure if patient has any chest pain or abdominal pain.  Patient has 3+ leg edema.  12/21: Encephalopathy appears improved.  Patient is in stepdown unit.  On my arrival she told me her name, her date of birth, current location, and the month and year.  Remains hypoglycemic unclear etiology.  Blood pressure also low.  HD yesterday.    Assessment & Plan:   Principal Problem:   Unresponsiveness Active Problems:   Hypertension   Anxiety   Sepsis ([place])   Atrial fibrillation ([facility])   Hypoglycemia   Hypothermia   ESRD on dialysis ([place])   DNR (do not resuscitate)   Type II diabetes mellitus with renal manifestations ([place])   Unresponsiveness:(resolved)  Possibly due to hypoglycemia.  CT head is negative for acute intracranial abnormalities. -Admitted to SDU as inpatient -Treat hypoglycemia as  below -Frequent neuro check -hold oral medications until mental status improves -Keep patient n.p.o. until mental status improves, then advance diet as tolerated -check ammonia level (pending  Hypoglycemia:  Patient is on Humalog for diabetes, may be due to decreased oral intake and continuation of insulin. She was previously on Actos, which was stopped at the time of discharge on 12/18 -cbg q1h -prn D50 -D10 at 75 cc/h - check sulfonylurea hypoglycemia panel  Hypothermia:  likely due to hypoglycemia -B[facility] Hugger  Hypertension:  -hold Bp meds until mental status improves  Anxiety: -hold home meds until mental status improves  Atrial Fibrillation:  CHA2DS2-VASc Score is 5, needs oral anticoagulation.  Patient is on Coumadin at home.  INR is 2.1 on admission.  Heart rate is 80-111. -Coumadin per pharm -hold oral metoprolol until mental status improves -As needed IV metoprolol if heart rate is above 125 - Warfarin level increasing, reason unclear -Pharmacy following for anticoagulation monitoring, recs appreciated   Possible Sepsis [facility]):  Patient meets criteria for sepsis. Patient has worsening leukocytosis with WBC 18.0 (WBC 14.1 on 12/20/2018, 9.7 on 04/15/18), hypothermia, tachycardia.  Procalcitonin 5.59.  Lactic acid 1.6.   Patient has unresponsiveness and altered mental status.   Source of infection is not clear.   Pending urinalysis.  Chest x-ray showed symmetric some opacity at the left base.  Since patient is s/p of splenectomy, at high risk of deteriorating, need broad antibiotics coverage. -vanco and aztreonam (patient received 1 dose of Flagyl in ED) (Start date: 01/03/19-   ) -  Follow-up blood culture, urine culture, urinalysis  ESRD on dialysis [facility]):  pt has anasarca -renal was consulted for HD  Type II diabetes mellitus with renal manifestations (ESRD):   Last A1c 6.1 on 12/30/18, well controled. Patient is taking Humalog at home. -hold insulin due  to hypoglycemia   DVT prophylaxis: Coumadin Code Status: DO NOT RESUSCITATE Family Communication: Sister Shauna Hugh, 01/04/2019 Disposition Plan: Skilled nursing facility  Consultants:   Neurology  ICU  Procedures:   None  Antimicrobials:   Vancomycin (01/03/2019-  )  Aztreonam (01/03/2019-  )   Subjective: Seen and examined Sleeping but awakens easily Oriented to person place and time  Objective: Vitals:   01/04/19 0430 01/04/19 0500 01/04/19 1200 01/04/19 1300  BP: (!) 90/52  110/70 92/65  Pulse: 68  (!) 108 (!) 105  Resp: 18  16 18   Temp: 97.9 F (36.6 C)     TempSrc: Axillary     SpO2: 99%  96% 99%  Weight:  106.7 kg    Height:        Intake/Output Summary (Last 24 hours) at 01/04/2019 1444 Last data filed at 01/04/2019 0556 Gross per 24 hour  Intake 605 ml  Output 700 ml  Net -95 ml   Filed Weights   01/03/19 0743 01/03/19 1853 01/04/19 0500  Weight: 93.7 kg 105.9 kg 106.7 kg    Examination:  General: Not in acute distress.  Patient anasarca HEENT:       Eyes: PERRL, EOMI, no scleral icterus.       ENT: No discharge from the ears and nose.       Neck: No JVD, no bruit, no mass felt. Heme: No neck lymph node enlargement. Cardiac: S1/S2, RRR, No murmurs, No gallops or rubs. Respiratory: Has fine crackles at the base bilaterally. GI: Soft, nondistended, nontender, no organomegaly, BS present. GU: No hematuria Ext: 3+ pitting leg edema bilaterally. 2+DP/PT pulse bilaterally. Musculoskeletal: No joint deformities, No joint redness or warmth, no limitation of ROM in spin. Skin: No rashes.  Neuro:  Grossly intact Psych: Patient is not psychotic, no suicidal or hemocidal ideation.  Oriented x3    Data Reviewed: I have personally reviewed following labs and imaging studies  CBC: Recent Labs  Lab 12/29/18 0800 12/30/18 0418 01/03/19 0719 01/04/19 0409  WBC 11.9* 14.1* 18.0* 16.4*  NEUTROABS  --   --  16.0*  --   HGB 13.9 13.8 14.1 11.6*   HCT 39.0 37.5 42.0 36.3  MCV 87.2 83.9 90.9 94.5  PLT 294 270 193 431   Basic Metabolic Panel: Recent Labs  Lab 12/29/18 0800 12/30/18 0418 12/31/18 0620 01/01/19 0827 01/03/19 0719 01/04/19 0409  NA 128* 133* 133* 136 134* 134*  K 6.1* 4.8 4.4 4.5 5.0 3.9  CL 88* 92* 92* 95* 95* 98  CO2 17* 21* 21* 22 25 25   GLUCOSE 137* 185* 105* 121* 23* 110*  BUN 122* 86* 67* 50* 58* 38*  CREATININE 12.30* 9.60* 7.51* 6.06* 6.10* 4.40*  CALCIUM 5.6* 5.9* 6.6* 7.4* 7.2* 6.6*  PHOS 10.9* 8.2* 6.5* 5.4*  --   --    GFR: Estimated Creatinine Clearance: 11.8 mL/min (A) (by C-G formula based on SCr of 4.4 mg/dL (H)). Liver Function Tests: Recent Labs  Lab 12/29/18 0800 12/30/18 0418 12/31/18 0620 01/01/19 0827 01/03/19 0719  AST  --   --   --   --  19  ALT  --   --   --   --  29  ALKPHOS  --   --   --   --  226*  BILITOT  --   --   --   --  1.3*  PROT  --   --   --   --  6.0*  ALBUMIN 2.2* 2.2* 2.3* 2.2* 2.2*   No results for input(s): LIPASE, AMYLASE in the last 168 hours. No results for input(s): AMMONIA in the last 168 hours. Coagulation Profile: Recent Labs  Lab 12/30/18 0418 12/31/18 0620 01/01/19 0827 01/03/19 0719 01/04/19 0409  INR 2.2* 2.0* 2.1* 2.1* 2.8*   Cardiac Enzymes: No results for input(s): CKTOTAL, CKMB, CKMBINDEX, TROPONINI in the last 168 hours. BNP (last 3 results) No results for input(s): PROBNP in the last 8760 hours. HbA1C: No results for input(s): HGBA1C in the last 72 hours. CBG: Recent Labs  Lab 01/04/19 0926 01/04/19 1044 01/04/19 1124 01/04/19 1209 01/04/19 1358  GLUCAP 169* 90 65* 131* 65*   Lipid Profile: No results for input(s): CHOL, HDL, LDLCALC, TRIG, CHOLHDL, LDLDIRECT in the last 72 hours. Thyroid Function Tests: No results for input(s): TSH, T4TOTAL, FREET4, T3FREE, THYROIDAB in the last 72 hours. Anemia Panel: No results for input(s): VITAMINB12, FOLATE, FERRITIN, TIBC, IRON, RETICCTPCT in the last 72 hours. Sepsis  Labs: Recent Labs  Lab 01/03/19 0719 01/04/19 0410  PROCALCITON 5.59  --   LATICACIDVEN 1.6 1.8    Recent Results (from the past 240 hour(s))  Urine Culture     Status: Abnormal   Collection Time: 12/27/18  6:41 AM   Specimen: Urine, Random  Result Value Ref Range Status   Specimen Description   Final    URINE, RANDOM Performed at [facility], [address], [place], [place] [address]    Special Requests   Final    NONE Performed at [facility], [place], [place] [address]    Culture 80,000 COLONIES/mL ENTEROCOCCUS FAECALIS (A)  Final   Report Status 12/30/2018 FINAL  Final   Organism ID, Bacteria ENTEROCOCCUS FAECALIS (A)  Final      Susceptibility   Enterococcus faecalis - MIC*    AMPICILLIN <=2 SENSITIVE Sensitive     NITROFURANTOIN <=16 SENSITIVE Sensitive     VANCOMYCIN 1 SENSITIVE Sensitive     * 80,000 COLONIES/mL ENTEROCOCCUS FAECALIS  SARS CORONAVIRUS 2 (TAT 6-24 HRS) Nasopharyngeal Nasopharyngeal Swab     Status: None   Collection Time: 12/31/18  2:31 PM   Specimen: Nasopharyngeal Swab  Result Value Ref Range Status   SARS Coronavirus 2 NEGATIVE NEGATIVE Final    Comment: (NOTE) SARS-CoV-2 target nucleic acids are NOT DETECTED. The SARS-CoV-2 RNA is generally detectable in upper and lower respiratory specimens during the acute phase of infection. Negative results do not preclude SARS-CoV-2 infection, do not rule out co-infections with other pathogens, and should not be used as the sole basis for treatment or other patient management decisions. Negative results must be combined with clinical observations, patient history, and epidemiological information. The expected result is Negative. Fact Sheet for Patients: SugarRoll.be Fact Sheet for Healthcare Providers: https://www.woods-mathews.com/ This test is not yet approved or cleared by the [place] FDA and  has been authorized  for detection and/or diagnosis of SARS-CoV-2 by FDA under an Emergency Use Authorization (EUA). This EUA will remain  in effect (meaning this test can be used) for the duration of the COVID-19 declaration under Section 56 4(b)(1) of the Act, 21 U.S.C. section 360bbb-3(b)(1), unless the authorization is terminated or revoked sooner. Performed  at [facility], [place] [address] [place], [place] [address]   Blood Culture (routine x 2)     Status: None (Preliminary result)   Collection Time: 01/03/19  7:19 AM   Specimen: BLOOD  Result Value Ref Range Status   Specimen Description BLOOD LAC  Final   Special Requests   Final    BOTTLES DRAWN AEROBIC AND ANAEROBIC Blood Culture adequate volume   Culture   Final    NO GROWTH < 24 HOURS Performed at [facility], [address] [place], [place] [address]    Report Status PENDING  Incomplete  Blood Culture (routine x 2)     Status: None (Preliminary result)   Collection Time: 01/03/19  7:19 AM   Specimen: BLOOD  Result Value Ref Range Status   Specimen Description BLOOD LH  Final   Special Requests   Final    BOTTLES DRAWN AEROBIC AND ANAEROBIC Blood Culture adequate volume   Culture   Final    NO GROWTH < 24 HOURS Performed at [facility], [address], [place], [place] [address]    Report Status PENDING  Incomplete  Respiratory Panel by RT PCR (Flu A&B, Covid) - Nasopharyngeal Swab     Status: None   Collection Time: 01/03/19  7:19 AM   Specimen: Nasopharyngeal Swab  Result Value Ref Range Status   SARS Coronavirus 2 by RT PCR NEGATIVE NEGATIVE Final    Comment: (NOTE) SARS-CoV-2 target nucleic acids are NOT DETECTED. The SARS-CoV-2 RNA is generally detectable in upper respiratoy specimens during the acute phase of infection. The lowest concentration of SARS-CoV-2 viral copies this assay can detect is 131 copies/mL. A negative result does not preclude SARS-Cov-2 infection and should not be used as the  sole basis for treatment or other patient management decisions. A negative result may occur with  improper specimen collection/handling, submission of specimen other than nasopharyngeal swab, presence of viral mutation(s) within the areas targeted by this assay, and inadequate number of viral copies (<131 copies/mL). A negative result must be combined with clinical observations, patient history, and epidemiological information. The expected result is Negative. Fact Sheet for Patients:  PinkCheek.be Fact Sheet for Healthcare Providers:  GravelBags.it This test is not yet ap proved or cleared by the [place] FDA and  has been authorized for detection and/or diagnosis of SARS-CoV-2 by FDA under an Emergency Use Authorization (EUA). This EUA will remain  in effect (meaning this test can be used) for the duration of the COVID-19 declaration under Section 564(b)(1) of the Act, 21 U.S.C. section 360bbb-3(b)(1), unless the authorization is terminated or revoked sooner.    Influenza A by PCR NEGATIVE NEGATIVE Final   Influenza B by PCR NEGATIVE NEGATIVE Final    Comment: (NOTE) The Xpert Xpress SARS-CoV-2/FLU/RSV assay is intended as an aid in  the diagnosis of influenza from Nasopharyngeal swab specimens and  should not be used as a sole basis for treatment. Nasal washings and  aspirates are unacceptable for Xpert Xpress SARS-CoV-2/FLU/RSV  testing. Fact Sheet for Patients: PinkCheek.be Fact Sheet for Healthcare Providers: GravelBags.it This test is not yet approved or cleared by the [place] FDA and  has been authorized for detection and/or diagnosis of SARS-CoV-2 by  FDA under an Emergency Use Authorization (EUA). This EUA will remain  in effect (meaning this test can be used) for the duration of the  Covid-19 declaration under Section 564(b)(1) of the Act, 21   U.S.C. section 360bbb-3(b)(1), unless  the authorization is  terminated or revoked. Performed at [facility], [address] [place], [place] [address]   Urine culture     Status: None   Collection Time: 01/03/19  7:40 AM   Specimen: In/Out Cath Urine  Result Value Ref Range Status   Specimen Description   Final    IN/OUT CATH URINE Performed at [facility], [address], [place], [place] [address]    Special Requests   Final    NONE Performed at [facility], [address] [place], [place] [address]    Culture   Final    NO GROWTH Performed at [facility], [place] [address] [place], [place] [address]    Report Status 01/04/2019 FINAL  Final  MRSA PCR Screening     Status: None   Collection Time: 01/04/19  1:42 AM   Specimen: Nasal Mucosa; Nasopharyngeal  Result Value Ref Range Status   MRSA by PCR NEGATIVE NEGATIVE Final    Comment:        The GeneXpert MRSA Assay (FDA approved for NASAL specimens only), is one component of a comprehensive MRSA colonization surveillance program. It is not intended to diagnose MRSA infection nor to guide or monitor treatment for MRSA infections. Performed at [facility], [address], [place], [place] [address]          Radiology Studies: CT Head Wo Contrast  Result Date: 01/03/2019 CLINICAL DATA:  Unresponsive, altered mental status. EXAM: CT HEAD WITHOUT CONTRAST TECHNIQUE: Contiguous axial images were obtained from the base of the skull through the vertex without intravenous contrast. COMPARISON:  None. FINDINGS: Brain: No evidence of acute infarction, hemorrhage, hydrocephalus, extra-axial collection or mass lesion/mass effect. Signs of atrophy and chronic microvascular ischemic change. Vascular: No hyperdense vessel or unexpected calcification. Skull: Normal. Negative for fracture or focal lesion. Sinuses/Orbits: Visualized sinuses and orbits are unremarkable. Other: None  IMPRESSION: 1. No acute intracranial abnormality. 2. Signs of atrophy and chronic microvascular ischemic change. Electronically Signed   By: Zetta Bills M.D.   On: 01/03/2019 09:12   DG Chest Port 1 View  Result Date: 01/03/2019 CLINICAL DATA:  Shortness of breath EXAM: PORTABLE CHEST 1 VIEW COMPARISON:  12/23/2018 FINDINGS: Chronic cardiomegaly. Atherosclerotic calcification. Mediastinal contours are distorted by leftward rotation. Dialysis catheter from the left with tip at the right atrium. Right-sided port in stable position. Diffuse interstitial coarsening, likely congestive. Nonvisualized left diaphragm where there could be pulmonary opacity or pleural effusion. No pneumothorax. IMPRESSION: Symmetric interstitial coarsening, likely congestive. There is opacity at the left base with nonvisualized diaphragm, suspect pleural effusion. Electronically Signed   By: Monte Fantasia M.D.   On: 01/03/2019 08:18        Scheduled Meds: . Chlorhexidine Gluconate Cloth  6 each Topical Q0600  . dextrose  1 ampule Intravenous Once  . Warfarin - Pharmacist Dosing Inpatient   Does not apply q1800   Continuous Infusions: . sodium chloride    . sodium chloride    . albumin human 25 g (01/04/19 1344)  . aztreonam 0.5 g (01/04/19 1139)  . dextrose 75 mL/hr at 01/04/19 0859     LOS: 1 day    Time spent: 35 minutes    Sidney Ace, MD [facility] Pager [phone]  If 7PM-7AM, please contact night-coverage [email or web address] Password [facility] 01/04/2019, 2:44 PM
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
## 2019-01-04 NOTE — Progress Notes (Signed)
Hypoglycemic Event  CBG: 54   Treatment: D50 50 mL (25 gm)  Symptoms: Pale  Follow-up CBG: Time:0000 CBG Result:168  Possible Reasons for Event: Inadequate meal intake  Comments/MD notified: Hassan Rowan NP     Gerhard Perches

## 2019-01-04 NOTE — Progress Notes (Signed)
Patient readmitted. Pervious admission patient started on hemodialysis, and placed at [facility] MWF 11:20. Patient has not treated with clinic due to readmission, this may affect chair time. Please contact me with any dialysis placement concerns.   Elvera Bicker Dialysis Coordinator [phone]

## 2019-01-04 NOTE — Progress Notes (Signed)
Hypoglycemic Event  CBG: 68   Treatment: D50 50 mL (25 gm)  Symptoms: None  Follow-up CBG: Time:0150  CBG Result:186   Possible Reasons for Event: Unknown  Comments/MD notified:Morrison NP    Gerhard Perches

## 2019-01-04 NOTE — Progress Notes (Signed)
Patient with lower blood pressures post HD.  Treated once with albumin with good effect for some duration, no with MAP again be,ow 65. Midodrine iven. May need to utilize midodrine on dialysis days routinely.

## 2019-01-04 NOTE — Consult Note (Addendum)
[place] for Warfarin Indication: atrial fibrillation  Allergies  Allergen Reactions  . Influenza Vaccines   . Iodinated Diagnostic Agents     IVP Dye  . Metformin Hcl Nausea Only and Nausea And Vomiting  . Penicillins   . Sulfa Antibiotics     Patient Measurements: Height: 5' (152.4 cm) Weight: 235 lb 3.7 oz (106.7 kg) IBW/kg (Calculated) : 45.5  Vital Signs: Temp: 97.9 F (36.6 C) (12/21 0430) Temp Source: Axillary (12/21 0430) BP: 90/52 (12/21 0430) Pulse Rate: 68 (12/21 0430)  Labs: Recent Labs    01/03/19 0719 01/04/19 0409  HGB 14.1 11.6*  HCT 42.0 36.3  PLT 193 166  APTT 39*  --   LABPROT 23.1* 29.1*  INR 2.1* 2.8*  CREATININE 6.10* 4.40*    Estimated Creatinine Clearance: 11.8 mL/min (A) (by C-G formula based on SCr of 4.4 mg/dL (H)).   Medical History: Past Medical History:  Diagnosis Date  . Erythrocytosis 06/05/2014  . History of radiation therapy   . Leukocytosis 06/05/2014  . Rectal cancer ([place]) 05/2002    Medications:  Warfarin 2 mg daily at 6 pm last out-patient dose 12/19 per nursing home [facility]  Assessment: Patient is a 78 y/o female with history of atrial fibrillation on warfarin, type 2 diabetes mellitus, hypertension, AKI on CKD on HD, cirrhosis, colon cancer s/p chemotherapy and resection who presented to [facility] ED 12/20 with chief complaint of apnea and hypoglycemia. She was found to be septic and started on broad spectrum antibiotics. Pharmacy has been consulted to continue home warfarin.   Hemoglobin 14.1. Platelets 193.   Date INR Dose 12/20 2.1 -- (not given, documented as pt unable to tolerate PO) 12/21 2.8    Goal of Therapy:  INR 2-3   Plan:  INR therapeutic, trending up (?). Do not expect current antibiotics to have an impact on INR. Unclear why INR trending up. Will hold dose of warfarin tonight and monitor trend in INR. INR with am labs.  Tawnya Crook, PharmD 01/04/2019,11:14  AM

## 2019-01-04 NOTE — Progress Notes (Signed)
Family Update:  Diane (patient's sister) had called this morning and confirmed her patient password. I was able to give her updates and answer all questions. The M.D. who had rounded just a few minutes before talked about wanting to speak with family for a long-term plan. I will try to give the M.D. this family members contact info so that he is able to speak with her.

## 2019-01-05 ENCOUNTER — Other Ambulatory Visit: Payer: Self-pay

## 2019-01-05 ENCOUNTER — Encounter: Payer: Self-pay | Admitting: Internal Medicine

## 2019-01-05 LAB — HEPATIC FUNCTION PANEL
ALT: 19 U/L (ref 0–44)
AST: 13 U/L — ABNORMAL LOW (ref 15–41)
Albumin: 2.6 g/dL — ABNORMAL LOW (ref 3.5–5.0)
Alkaline Phosphatase: 137 U/L — ABNORMAL HIGH (ref 38–126)
Bilirubin, Direct: 0.4 mg/dL — ABNORMAL HIGH (ref 0.0–0.2)
Indirect Bilirubin: 0.8 mg/dL (ref 0.3–0.9)
Total Bilirubin: 1.2 mg/dL (ref 0.3–1.2)
Total Protein: 5.6 g/dL — ABNORMAL LOW (ref 6.5–8.1)

## 2019-01-05 LAB — CBC WITH DIFFERENTIAL/PLATELET
Abs Immature Granulocytes: 0 10*3/uL (ref 0.00–0.07)
Basophils Absolute: 0 10*3/uL (ref 0.0–0.1)
Basophils Relative: 0 %
Eosinophils Absolute: 0 10*3/uL (ref 0.0–0.5)
Eosinophils Relative: 0 %
HCT: 38.2 % (ref 36.0–46.0)
Hemoglobin: 12.5 g/dL (ref 12.0–15.0)
Lymphocytes Relative: 6 %
Lymphs Abs: 0.7 10*3/uL (ref 0.7–4.0)
MCH: 30.9 pg (ref 26.0–34.0)
MCHC: 32.7 g/dL (ref 30.0–36.0)
MCV: 94.3 fL (ref 80.0–100.0)
Monocytes Absolute: 0.1 10*3/uL (ref 0.1–1.0)
Monocytes Relative: 1 %
Neutro Abs: 10.6 10*3/uL — ABNORMAL HIGH (ref 1.7–7.7)
Neutrophils Relative %: 93 %
Platelets: 165 10*3/uL (ref 150–400)
RBC: 4.05 MIL/uL (ref 3.87–5.11)
RDW: 15.2 % (ref 11.5–15.5)
WBC: 11.4 10*3/uL — ABNORMAL HIGH (ref 4.0–10.5)
nRBC: 3.5 % — ABNORMAL HIGH (ref 0.0–0.2)
nRBC: 8 /100 WBC — ABNORMAL HIGH

## 2019-01-05 LAB — RENAL FUNCTION PANEL
Albumin: 2.6 g/dL — ABNORMAL LOW (ref 3.5–5.0)
Anion gap: 10 (ref 5–15)
BUN: 32 mg/dL — ABNORMAL HIGH (ref 8–23)
CO2: 30 mmol/L (ref 22–32)
Calcium: 6.8 mg/dL — ABNORMAL LOW (ref 8.9–10.3)
Chloride: 94 mmol/L — ABNORMAL LOW (ref 98–111)
Creatinine, Ser: 3.94 mg/dL — ABNORMAL HIGH (ref 0.44–1.00)
GFR calc Af Amer: 12 mL/min — ABNORMAL LOW (ref >60)
GFR calc non Af Amer: 10 mL/min — ABNORMAL LOW (ref >60)
Glucose, Bld: 91 mg/dL (ref 70–99)
Phosphorus: 4.2 mg/dL (ref 2.5–4.6)
Potassium: 4.1 mmol/L (ref 3.5–5.1)
Sodium: 134 mmol/L — ABNORMAL LOW (ref 135–145)

## 2019-01-05 LAB — GLUCOSE, CAPILLARY
Glucose-Capillary: 106 mg/dL — ABNORMAL HIGH (ref 70–99)
Glucose-Capillary: 125 mg/dL — ABNORMAL HIGH (ref 70–99)
Glucose-Capillary: 134 mg/dL — ABNORMAL HIGH (ref 70–99)
Glucose-Capillary: 137 mg/dL — ABNORMAL HIGH (ref 70–99)
Glucose-Capillary: 140 mg/dL — ABNORMAL HIGH (ref 70–99)
Glucose-Capillary: 142 mg/dL — ABNORMAL HIGH (ref 70–99)
Glucose-Capillary: 160 mg/dL — ABNORMAL HIGH (ref 70–99)
Glucose-Capillary: 168 mg/dL — ABNORMAL HIGH (ref 70–99)
Glucose-Capillary: 173 mg/dL — ABNORMAL HIGH (ref 70–99)
Glucose-Capillary: 177 mg/dL — ABNORMAL HIGH (ref 70–99)
Glucose-Capillary: 185 mg/dL — ABNORMAL HIGH (ref 70–99)
Glucose-Capillary: 69 mg/dL — ABNORMAL LOW (ref 70–99)
Glucose-Capillary: 79 mg/dL (ref 70–99)
Glucose-Capillary: 93 mg/dL (ref 70–99)

## 2019-01-05 LAB — PROTIME-INR
INR: 2.1 — ABNORMAL HIGH (ref 0.8–1.2)
Prothrombin Time: 23.7 seconds — ABNORMAL HIGH (ref 11.4–15.2)

## 2019-01-05 LAB — CORTISOL-AM, BLOOD: Cortisol - AM: >100 ug/dL — ABNORMAL HIGH (ref 6.7–22.6)

## 2019-01-05 MED ORDER — WARFARIN SODIUM 2 MG PO TABS
2.0000 mg | ORAL_TABLET | Freq: Once | ORAL | Status: AC
  Administered 2019-01-05: 2 mg via ORAL
  Filled 2019-01-05: qty 1

## 2019-01-05 MED ORDER — CHLORHEXIDINE GLUCONATE CLOTH 2 % EX PADS
6.0000 | MEDICATED_PAD | Freq: Every day | CUTANEOUS | Status: DC
  Administered 2019-01-05 – 2019-01-06 (×2): 6 via TOPICAL
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
## 2019-01-05 NOTE — Plan of Care (Signed)
Pt lethargic but oriented this shift, 2.2 liters off in HD, weeping edema continues but is lessened, BP soft but adequate, pt declines food, takes ice chips, will accept meds crushed in applesauce.  Meeting with pall team and Hospice and sister Diane tomorrow

## 2019-01-05 NOTE — TOC Initial Note (Signed)
Transition of Care [facility]) - Initial/Assessment Note    Patient Details  Name: Danielle Rowland MRN: [id number] Date of Birth: 01-16-1940  Transition of Care [facility]) CM/SW Contact:    Shelbie Ammons, RN Phone Number: 01/05/2019, 10:43 AM  Clinical Narrative:         RN CM placed call to patient's sister due to diminished cognitive state at this time to discuss needs and discharge planning. Sister reports she is upset because she does not know how long her sister will be here and they are charging around a $160 dollars a day to hold her sisters bed at [facility]. Sister reports that the patient does not have that kind of money and she does not plan to pay this long. Sister reports she is awaiting a call from Fredia Beets at [facility], as well as from nurse in ICU to inform her of when patient is going to have Dialysis. Informed sister that this nurse CM would return call to her when any new  information to share.          Patient Goals and CMS Choice        Expected Discharge Plan and Services                                                Prior Living Arrangements/Services                       Activities of Daily Living      Permission Sought/Granted                  Emotional Assessment              Admission diagnosis:  Acute pulmonary edema ([place]) [J81.0] Peripheral edema [R60.9] Hypoglycemia [E16.2] Unresponsiveness [R41.89] Altered mental status, unspecified altered mental status type [R41.82] Patient Active Problem List   Diagnosis Date Noted  . Altered mental status   . Unresponsiveness 01/03/2019  . Hypoglycemia 01/03/2019  . Hypothermia 01/03/2019  . ESRD on dialysis ([place]) 01/03/2019  . DNR (do not resuscitate) 01/03/2019  . Type II diabetes mellitus with renal manifestations ([place]) 01/03/2019  . Other cirrhosis of liver ([place]) 12/25/2018  . Hydronephrosis, left 12/25/2018  . Septic shock ([place]) 12/24/2018  . AKI  (acute kidney injury) ([place]) 12/24/2018  . Colitis, acute 12/24/2018  . Elevated troponin 12/24/2018  . Essential hypertension 12/24/2018  . Atrial fibrillation ([place]) 12/24/2018  . Asplenia 05/19/2017  . Rectal cancer ([place]) 04/24/2016  . Panic disorder 07/03/2015  . Allergic rhinitis 03/23/2015  . Hypertension 07/04/2014  . Hypercholesteremia 07/04/2014  . Osteopenia 07/04/2014  . Vitamin D deficiency 07/04/2014  . Anxiety 07/04/2014  . CLL (chronic lymphocytic leukemia) ([place]) 07/04/2014  . Non-insulin dependent type 2 diabetes mellitus ([place]) 07/04/2014  . Diverticulitis 07/04/2014  . H/O malignant neoplasm of colon 07/04/2014  . Adiposity 07/04/2014  . Hernia of anterior abdominal wall 03/23/2013   PCP:  Birdie Sons, MD Pharmacy:   [id number] [place], [address] [place] [address] Phone: [phone] Fax: [phone]     Social Determinants of Health (SDOH) Interventions    Readmission Risk Interventions Readmission Risk Prevention Plan 01/05/2019  Transportation Screening Complete  PCP or Specialist Appt within 3-5 Days Complete  Social Work Consult  for Recovery Care Planning/Counseling Complete  Palliative Care Screening Not Applicable  Medication Review (RN Care Manager) Complete  Some recent data might be hidden

## 2019-01-05 NOTE — Consult Note (Signed)
[place] for Warfarin Indication: atrial fibrillation  Allergies  Allergen Reactions  . Influenza Vaccines   . Iodinated Diagnostic Agents     IVP Dye  . Metformin Hcl Nausea Only and Nausea And Vomiting  . Penicillins   . Sulfa Antibiotics     Patient Measurements: Height: 5' (152.4 cm) Weight: 237 lb 3.4 oz (107.6 kg) IBW/kg (Calculated) : 45.5  Vital Signs: Temp: 97.4 F (36.3 C) (12/22 1255) Temp Source: Axillary (12/22 1255) BP: 93/59 (12/22 1400) Pulse Rate: 71 (12/22 1345)  Labs: Recent Labs    01/03/19 0719 01/04/19 0409 01/05/19 1331  HGB 14.1 11.6* 12.5  HCT 42.0 36.3 38.2  PLT 193 166 165  APTT 39*  --   --   LABPROT 23.1* 29.1* 23.7*  INR 2.1* 2.8* 2.1*  CREATININE 6.10* 4.40*  --     Estimated Creatinine Clearance: 11.9 mL/min (A) (by C-G formula based on SCr of 4.4 mg/dL (H)).   Medical History: Past Medical History:  Diagnosis Date  . Erythrocytosis 06/05/2014  . History of radiation therapy   . Leukocytosis 06/05/2014  . Rectal cancer ([place]) 05/2002    Medications:  Warfarin 2 mg daily at 6 pm last out-patient dose 12/19 per nursing home [facility]  Assessment: Patient is a 78 y/o female with history of atrial fibrillation on warfarin, type 2 diabetes mellitus, hypertension, AKI on CKD on HD, cirrhosis, colon cancer s/p chemotherapy and resection who presented to [facility] ED 12/20 with chief complaint of apnea and hypoglycemia. She was found to be septic and started on broad spectrum antibiotics. Pharmacy has been consulted to continue home warfarin.   Hemoglobin 14.1. Platelets 193.   Date INR Dose 12/20 2.1 -- (not given, documented as pt unable to tolerate PO) 12/21 2.8 -- (not given) 12/22   2.1         Goal of Therapy:  INR 2-3   Plan:  INR therapeutic, but trending down.   Unclear as to why INR was elevated yesterday unless attributed to antibiotics.  Patient's last warfarin dose was 12/19, and  received Flagyl x 1 dose 12/20.  MetroNIDAZOLE (Systemic) may increase the serum concentration of Vitamin K Antagonists. (Severity Major Reliability Rating Good)  Will give warfarin 2 mg this evening. Pharmacy will monitor trend in INR with AM labs.  Gerald Dexter, PharmD 01/05/2019,2:10 PM

## 2019-01-05 NOTE — Progress Notes (Signed)
[facility]  ROUNDING NOTE   Subjective:   Placed on stress dose steroids. Glucose readings have improved.   Patient awake and alert this morning. Complains of peripheral edema.   Weaned off phenylephrine. Placed on midodrine  Objective:  Vital signs in last 24 hours:  Temp:  [97.5 F (36.4 C)-98 F (36.7 C)] 97.6 F (36.4 C) (12/22 0800) Pulse Rate:  [42-108] 78 (12/22 1000) Resp:  [11-30] 22 (12/22 1000) BP: (72-110)/(44-93) 105/93 (12/22 1000) SpO2:  [95 %-100 %] 95 % (12/22 1000) Weight:  [106.7 kg] 106.7 kg (12/22 0500)  Weight change: 13 kg Filed Weights   01/03/19 1853 01/04/19 0500 01/05/19 0500  Weight: 105.9 kg 106.7 kg 106.7 kg    Intake/Output: I/O last 3 completed shifts: In: 3083.3 [I.V.:2659.9; IV Piggyback:423.4] Out: 700 [Other:700]   Intake/Output this shift:  No intake/output data recorded.  Physical Exam: General: Ill appearing  Head: Normocephalic, atraumatic.   Eyes: Anicteric, PERRL  Neck: Supple, trachea midline  Lungs:  Clear to auscultation  Heart: irregular  Abdomen:  Soft, nontender, obese  Extremities:  + weeping peripheral edema.  Neurologic: Alert and oriented  Skin: No lesions  Access: LIJ permcath    Basic Metabolic Panel: Recent Labs  Lab 12/30/18 0418 12/31/18 0620 01/01/19 0827 01/03/19 0719 01/04/19 0409  NA 133* 133* 136 134* 134*  K 4.8 4.4 4.5 5.0 3.9  CL 92* 92* 95* 95* 98  CO2 21* 21* 22 25 25   GLUCOSE 185* 105* 121* 23* 110*  BUN 86* 67* 50* 58* 38*  CREATININE 9.60* 7.51* 6.06* 6.10* 4.40*  CALCIUM 5.9* 6.6* 7.4* 7.2* 6.6*  PHOS 8.2* 6.5* 5.4*  --   --     Liver Function Tests: Recent Labs  Lab 12/30/18 0418 12/31/18 0620 01/01/19 0827 01/03/19 0719  AST  --   --   --  19  ALT  --   --   --  29  ALKPHOS  --   --   --  226*  BILITOT  --   --   --  1.3*  PROT  --   --   --  6.0*  ALBUMIN 2.2* 2.3* 2.2* 2.2*   No results for input(s): LIPASE, AMYLASE in the last 168  hours. Recent Labs  Lab 01/04/19 1509  AMMONIA 26    CBC: Recent Labs  Lab 12/30/18 0418 01/03/19 0719 01/04/19 0409  WBC 14.1* 18.0* 16.4*  NEUTROABS  --  16.0*  --   HGB 13.8 14.1 11.6*  HCT 37.5 42.0 36.3  MCV 83.9 90.9 94.5  PLT 270 193 166    Cardiac Enzymes: No results for input(s): CKTOTAL, CKMB, CKMBINDEX, TROPONINI in the last 168 hours.  BNP: Invalid input(s): POCBNP  CBG: Recent Labs  Lab 01/05/19 0300 01/05/19 0425 01/05/19 0519 01/05/19 0553 01/05/19 0721  GLUCAP 140* 160* 168* 185* 177*    Microbiology: Results for orders placed or performed during the hospital encounter of 01/03/19  Blood Culture (routine x 2)     Status: None (Preliminary result)   Collection Time: 01/03/19  7:19 AM   Specimen: BLOOD  Result Value Ref Range Status   Specimen Description BLOOD LAC  Final   Special Requests   Final    BOTTLES DRAWN AEROBIC AND ANAEROBIC Blood Culture adequate volume   Culture   Final    NO GROWTH 2 DAYS Performed at [facility], [address], [place], [place] [address]    Report Status PENDING  Incomplete  Blood Culture (routine x 2)     Status: None (Preliminary result)   Collection Time: 01/03/19  7:19 AM   Specimen: BLOOD  Result Value Ref Range Status   Specimen Description BLOOD LH  Final   Special Requests   Final    BOTTLES DRAWN AEROBIC AND ANAEROBIC Blood Culture adequate volume   Culture   Final    NO GROWTH 2 DAYS Performed at [facility], [address], [place], [place] [address]    Report Status PENDING  Incomplete  Respiratory Panel by RT PCR (Flu A&B, Covid) - Nasopharyngeal Swab     Status: None   Collection Time: 01/03/19  7:19 AM   Specimen: Nasopharyngeal Swab  Result Value Ref Range Status   SARS Coronavirus 2 by RT PCR NEGATIVE NEGATIVE Final    Comment: (NOTE) SARS-CoV-2 target nucleic acids are NOT DETECTED. The SARS-CoV-2 RNA is generally detectable in upper respiratoy specimens  during the acute phase of infection. The lowest concentration of SARS-CoV-2 viral copies this assay can detect is 131 copies/mL. A negative result does not preclude SARS-Cov-2 infection and should not be used as the sole basis for treatment or other patient management decisions. A negative result may occur with  improper specimen collection/handling, submission of specimen other than nasopharyngeal swab, presence of viral mutation(s) within the areas targeted by this assay, and inadequate number of viral copies (<131 copies/mL). A negative result must be combined with clinical observations, patient history, and epidemiological information. The expected result is Negative. Fact Sheet for Patients:  PinkCheek.be Fact Sheet for Healthcare Providers:  GravelBags.it This test is not yet ap proved or cleared by the [place] FDA and  has been authorized for detection and/or diagnosis of SARS-CoV-2 by FDA under an Emergency Use Authorization (EUA). This EUA will remain  in effect (meaning this test can be used) for the duration of the COVID-19 declaration under Section 564(b)(1) of the Act, 21 U.S.C. section 360bbb-3(b)(1), unless the authorization is terminated or revoked sooner.    Influenza A by PCR NEGATIVE NEGATIVE Final   Influenza B by PCR NEGATIVE NEGATIVE Final    Comment: (NOTE) The Xpert Xpress SARS-CoV-2/FLU/RSV assay is intended as an aid in  the diagnosis of influenza from Nasopharyngeal swab specimens and  should not be used as a sole basis for treatment. Nasal washings and  aspirates are unacceptable for Xpert Xpress SARS-CoV-2/FLU/RSV  testing. Fact Sheet for Patients: PinkCheek.be Fact Sheet for Healthcare Providers: GravelBags.it This test is not yet approved or cleared by the [place] FDA and  has been authorized for detection and/or diagnosis of  SARS-CoV-2 by  FDA under an Emergency Use Authorization (EUA). This EUA will remain  in effect (meaning this test can be used) for the duration of the  Covid-19 declaration under Section 564(b)(1) of the Act, 21  U.S.C. section 360bbb-3(b)(1), unless the authorization is  terminated or revoked. Performed at [facility], [address] [place], [place] [address]   Urine culture     Status: None   Collection Time: 01/03/19  7:40 AM   Specimen: In/Out Cath Urine  Result Value Ref Range Status   Specimen Description   Final    IN/OUT CATH URINE Performed at [facility], [address], [place], [place] [address]    Special Requests   Final    NONE Performed at [facility], [address], [place], [place] [address]    Culture   Final  NO GROWTH Performed at [facility], [place] [address], [place], [place] [address]    Report Status 01/04/2019 FINAL  Final  MRSA PCR Screening     Status: None   Collection Time: 01/04/19  1:42 AM   Specimen: Nasal Mucosa; Nasopharyngeal  Result Value Ref Range Status   MRSA by PCR NEGATIVE NEGATIVE Final    Comment:        The GeneXpert MRSA Assay (FDA approved for NASAL specimens only), is one component of a comprehensive MRSA colonization surveillance program. It is not intended to diagnose MRSA infection nor to guide or monitor treatment for MRSA infections. Performed at [facility], [address] [place], [place] [address]     Coagulation Studies: Recent Labs    01/03/19 0719 01/04/19 0409  LABPROT 23.1* 29.1*  INR 2.1* 2.8*    Urinalysis: Recent Labs    01/03/19 0748  COLORURINE BROWN*  LABSPEC 1.023  PHURINE TEST NOT REPORTED DUE TO COLOR INTERFERENCE OF URINE PIGMENT  GLUCOSEU TEST NOT REPORTED DUE TO COLOR INTERFERENCE OF URINE PIGMENT*  HGBUR TEST NOT REPORTED DUE TO COLOR INTERFERENCE OF URINE PIGMENT*  BILIRUBINUR TEST NOT REPORTED DUE TO COLOR INTERFERENCE OF  URINE PIGMENT*  KETONESUR TEST NOT REPORTED DUE TO COLOR INTERFERENCE OF URINE PIGMENT*  PROTEINUR TEST NOT REPORTED DUE TO COLOR INTERFERENCE OF URINE PIGMENT*  NITRITE TEST NOT REPORTED DUE TO COLOR INTERFERENCE OF URINE PIGMENT*  LEUKOCYTESUR TEST NOT REPORTED DUE TO COLOR INTERFERENCE OF URINE PIGMENT*      Imaging: No results found.   Medications:   . sodium chloride    . sodium chloride    . albumin human Stopped (01/04/19 1926)  . aztreonam 0.5 g (01/05/19 0947)  . phenylephrine (NEO-SYNEPHRINE) Adult infusion 20 mcg/min (01/04/19 1838)   . Chlorhexidine Gluconate Cloth  6 each Topical Daily  . dextrose  1 ampule Intravenous Once  . hydrocortisone sod succinate (SOLU-CORTEF) inj  50 mg Intravenous Q6H  . midodrine  10 mg Oral TID WC  . Warfarin - Pharmacist Dosing Inpatient   Does not apply q1800   sodium chloride, sodium chloride, acetaminophen **OR** acetaminophen, albumin human, alteplase, dextrose, heparin, heparin, lidocaine (PF), lidocaine-prilocaine, metoprolol tartrate, ondansetron **OR** ondansetron (ZOFRAN) IV, pentafluoroprop-tetrafluoroeth  Assessment/ Plan:  [name] is a 78 y.o. white female with acute renal failure requiring hemodialysis, hepatic cirrhosis, diabetes mellitus type II, atrial fibrillation, hypertension, admitted on 01/03/2019 with altered mental status Dis[facility] from [facility] on 12/19 to [facility] [place]  1. Acute Kidney Failure requiring outpatient hemodialysis: emergent hemodialysis treatment on admission.  Baseline creatinine of 0.89 on 04/15/2018. Serologic work up negative on 12/30/18.  Chronic obstructive uropathy on ultrasound on 12/9 Patient will need dialysis today. Orders prepared.   2. Hypotension and sepsis weaned off vasopressors. Placed on steroids.   Cultures with no growth this admission.  - placed on midodrine - holding metoprolol - broad spectrum  antibiotics: aztreonam, metronidazole and vancomycin (allergic to pcn and sulfa)  3. Anemia with chronic kidney disease: hemoglobin stable at 11.6  4. Secondary Hyperparathyroidism with hypocalcemia: PTH 517.   5. Altered mental status: improved. Metabolic etiology.   6. Hepatic cirrhosis with anasarca: status post IV albumin administration.    LOS: 2 Honest Safranek 12/22/202011:16 AM

## 2019-01-05 NOTE — Plan of Care (Signed)
Pt transferred to floor care, although floor bed may not be available.  Sent to HD from ICU 6, pt oriented when speaking to her and states "I feel ok,"  appropriate but forgetful.  Sleeps between care, takes meds crushed in applesauce w/o s/sx distress.  2 liters [place], BP soft but MAPS > 65.

## 2019-01-05 NOTE — Progress Notes (Signed)
PROGRESS NOTE    Danielle Rowland  WER:[id number] DOB: July 20, 1940 DOA: 01/03/2019 PCP: Danielle Sons, MD    Brief Narrative:Danielle Rowland is a 78 y.o. female with medical history significant of ESRD-HD, hypertension, diabetes mellitus, rectal cancer (resection:), CLL, splenectomy, liver cirrhosis, who presents with unresponsiveness.  Patient has AMS, and is unable to provide medical history, therefore, most of the history is obtained by discussing the case with ED physician, per EMS report, and with the nursing staff.  Per report, pt is from [facility]. She was found to be unresponsive this morning. Pt is a new resident at [facility] only there proximately 24 hours per report. EMS reports blood sugar 68 and pt was occasional episodes of apnea. Her blood sugar was 23 initially and repeated test showed that her blood sugar was 10 in ED. Pt was give D50 and D5 gtt, her blood sugar improved to 200 and then decreased to 90s again. When I saw pt in ED, she is minimally responsive. She moves all extremities upon painful stimuli.  No active cough, nausea, vomiting, diarrhea noted.  Not sure if patient has any chest pain or abdominal pain.  Patient has 3+ leg edema.  12/21: Encephalopathy appears improved.  Patient is in stepdown unit.  On my arrival she told me her name, her date of birth, current location, and the month and year.  Remains hypoglycemic unclear etiology.  Blood pressure also low.  HD yesterday.  12/22: Encephalopathy waxing and waning.  Patient is lethargic this morning but does awaken easily.  Remains in stepdown unit however transfer orders placed per ICU attending.  On my arrival she did tell me her name but did not participate into much more conversation.  Hypoglycemia improved with addition of stress dose steroids.  Blood pressure remains low though improved over interval.  Patient is markedly edematous and weeping, hemodialysis scheduled for today.  Palliative care  consultation occurred today with plans to have family meeting tomorrow at 2 PM    Assessment & Plan:   Principal Problem:   Unresponsiveness Active Problems:   Hypertension   Anxiety   Septic shock ([place])   Atrial fibrillation ([place])   Hypoglycemia   Hypothermia   ESRD on dialysis ([place])   DNR (do not resuscitate)   Type II diabetes mellitus with renal manifestations ([place])   Altered mental status   Septic shock likely secondary to UTI Patient meets criteria for sepsis. Patient has worsening leukocytosis with WBC 18.0 (WBC 14.1 on 12/20/2018, 9.7 on 04/15/18), hypothermia, tachycardia.  Procalcitonin 5.59.  Lactic acid 1.6.   Patient has unresponsiveness and altered mental status.  UA indicative of infection.  Chest x-ray showed symmetric some opacity at the left base.  Since patient is s/p of splenectomy, at high risk of deteriorating, need broad antibiotics coverage. ICU consulted on 01/04/2023 evaluation for pressor support -Wean off pressors as tolerated -Continue stress dose steroids - vanco and aztreonam (patient received 1 dose of Flagyl in ED) (Start date: 01/03/19-   ) - Follow-up blood culture, urine culture, urinalysis -De-escalate antibiotics as appropriate, continue broad-spectrum  Unresponsiveness:(resolved)  Possibly due to hypoglycemia.  CT head is negative for acute intracranial abnormalities. -Admitted to SDU as inpatient -Frequent neuro checks -Maintain adequate blood glucose -Maintain MAP greater than 65 -Midodrine and albumin support as needed  Hypoglycemia (improved) Patient is on Humalog for diabetes, may be due to decreased oral intake and continuation of insulin. She was previously on Actos, which was  stopped at the time of discharge on 12/18 -Sugars improved with the addition of stress dose steroids -prn D50 -DC dextrose infusion considering edema and anasarca - check sulfonylurea hypoglycemia panel (pending) -Suspect liver failure and adrenal  insufficiency as cause for persistent hypoglycemia  Hypothermia:  likely due to hypoglycemia -Bair Hugger  Hypertension:  -hold Bp meds until mental status improves  Anxiety: -hold home meds until mental status improves  Atrial Fibrillation:  CHA2DS2-VASc Score is 5, needs oral anticoagulation.  Patient is on Coumadin at home.  INR is 2.1 on admission.  Heart rate is 80-111. -Coumadin per pharm -hold oral metoprolol until mental status improves -As needed IV metoprolol if heart rate is above 125 - Warfarin level increasing, reason unclear -Pharmacy following for anticoagulation monitoring, recs appreciated   ESRD on dialysis [facility]):  pt has anasarca -renal was consulted for HD  Type II diabetes mellitus with renal manifestations (ESRD):   Last A1c 6.1 on 12/30/18, well controled. Patient is taking Humalog at home. -hold insulin due to hypoglycemia  Patient has anticipated poor outcome with multiple severe medical comorbidities.  Persistent hypoglycemia, hypotension, ESRD, liver failure, toxic metabolic encephalopathy.  Considering the severity of her symptoms palliative care consultation was requested by ICU attending.  Palliative care met with patient sister today with plans to hold a family meeting tomorrow 01/05/2019 at 2 PM.  We will continue antibiotics and remainder of aggressive care at this time pending family decision.  At this time recommend strong consideration for hospice/palliative measures.   DVT prophylaxis: Coumadin Code Status: DO NOT RESUSCITATE Family Communication: Sister Danielle Rowland, 01/04/2019 Disposition Plan: Skilled nursing facility  Consultants:   Neurology  ICU  Procedures:   None  Antimicrobials:   Vancomycin (01/03/2019-  )  Aztreonam (01/03/2019-  )   Subjective: Seen and examined Sleeping but awakens easily Oriented to person only Appears lethargic  Objective: Vitals:   01/05/19 1545 01/05/19 1600 01/05/19 1607 01/05/19 1612   BP: (!) 1_0 97/70  Pulse: 90 89 89 97  Resp: _1 Temp:   97.6 F (36.4 C)   TempSrc:   Axillary   SpO2:   99%   Weight:   105.1 kg   Height:        Intake/Output Summary (Last 24 hours) at 01/05/2019 1652 Last data filed at 01/05/2019 1607 Gross per 24 hour  Intake 1628.26 ml  Output 3289 ml  Net -1660.74 ml   Filed Weights   01/05/19 0500 01/05/19 1255 01/05/19 1607  Weight: 106.7 kg 107.6 kg 105.1 kg    Examination:  General: Not in acute distress.  Patient anasarca HEENT:       Eyes: PERRL, EOMI, no scleral icterus.       ENT: No discharge from the ears and nose.       Neck: No JVD, no bruit, no mass felt. Heme: No neck lymph node enlargement. Cardiac: S1/S2, RRR, No murmurs, No gallops or rubs. Respiratory: Has fine crackles at the base bilaterally. GI: Soft, nondistended, nontender, no organomegaly, BS present. GU: No hematuria Ext: 3+ pitting leg edema bilaterally. 2+DP/PT pulse bilaterally. Musculoskeletal: No joint deformities, No joint redness or warmth, no limitation of ROM in spin. Skin: No rashes.  Neuro:  Grossly intact Psych: Patient is not psychotic, no suicidal or hemocidal ideation.  Oriented x1    Data Reviewed: I have personally reviewed following labs and imaging studies  CBC: Recent Labs  Lab 12/30/18 0418 01/03/19 0719 01/04/19 0409  01/05/19 1331  WBC 14.1* 18.0* 16.4* 11.4*  NEUTROABS  --  16.0*  --  10.6*  HGB 13.8 14.1 11.6* 12.5  HCT 37.5 42.0 36.3 38.2  MCV 83.9 90.9 94.5 94.3  PLT 270 193 166 166   Basic Metabolic Panel: Recent Labs  Lab 12/30/18 0418 12/31/18 0620 01/01/19 0827 01/03/19 0719 01/04/19 0409 01/05/19 1331  NA 133* 133* 136 134* 134* 134*  K 4.8 4.4 4.5 5.0 3.9 4.1  CL 92* 92* 95* 95* 98 94*  CO2 21* 21* _0 GLUCOSE 185* 105* 121* 23* 110* 91  BUN 86* 67* 50* 58* 38* 32*  CREATININE 9.60* 7.51* 6.06* 6.10* 4.40* 3.94*  CALCIUM 5.9* 6.6* 7.4* 7.2* 6.6* 6.8*  PHOS  8.2* 6.5* 5.4*  --   --  4.2   GFR: Estimated Creatinine Clearance: 13.1 mL/min (A) (by C-G formula based on SCr of 3.94 mg/dL (H)). Liver Function Tests: Recent Labs  Lab 12/30/18 0418 12/31/18 0620 01/01/19 0827 01/03/19 0719 01/05/19 1331  AST  --   --   --  19 13*  ALT  --   --   --  29 19  ALKPHOS  --   --   --  226* 137*  BILITOT  --   --   --  1.3* 1.2  PROT  --   --   --  6.0* 5.6*  ALBUMIN 2.2* 2.3* 2.2* 2.2* 2.6*  2.6*   No results for input(s): LIPASE, AMYLASE in the last 168 hours. Recent Labs  Lab 01/04/19 1509  AMMONIA 26   Coagulation Profile: Recent Labs  Lab 12/31/18 0620 01/01/19 0827 01/03/19 0719 01/04/19 0409 01/05/19 1331  INR 2.0* 2.1* 2.1* 2.8* 2.1*   Cardiac Enzymes: No results for input(s): CKTOTAL, CKMB, CKMBINDEX, TROPONINI in the last 168 hours. BNP (last 3 results) No results for input(s): PROBNP in the last 8760 hours. HbA1C: No results for input(s): HGBA1C in the last 72 hours. CBG: Recent Labs  Lab 01/05/19 0519 01/05/19 0553 01/05/19 0721 01/05/19 1204 01/05/19 1637  GLUCAP 168* 185* 177* 93 69*   Lipid Profile: No results for input(s): CHOL, HDL, LDLCALC, TRIG, CHOLHDL, LDLDIRECT in the last 72 hours. Thyroid Function Tests: No results for input(s): TSH, T4TOTAL, FREET4, T3FREE, THYROIDAB in the last 72 hours. Anemia Panel: No results for input(s): VITAMINB12, FOLATE, FERRITIN, TIBC, IRON, RETICCTPCT in the last 72 hours. Sepsis Labs: Recent Labs  Lab 01/03/19 0719 01/04/19 0410  PROCALCITON 5.59  --   LATICACIDVEN 1.6 1.8    Recent Results (from the past 240 hour(s))  Urine Culture     Status: Abnormal   Collection Time: 12/27/18  6:41 AM   Specimen: Urine, Random  Result Value Ref Range Status   Specimen Description   Final    URINE, RANDOM Performed at [facility], [address] [place], [place] [address]    Special Requests   Final    NONE Performed at [facility], [address] [place], [place] [address]    Culture 80,000 COLONIES/mL ENTEROCOCCUS FAECALIS (A)  Final   Report Status 12/30/2018 FINAL  Final   Organism ID, Bacteria ENTEROCOCCUS FAECALIS (A)  Final      Susceptibility   Enterococcus faecalis - MIC*    AMPICILLIN <=2 SENSITIVE Sensitive     NITROFURANTOIN <=16 SENSITIVE Sensitive     VANCOMYCIN 1 SENSITIVE Sensitive     * 80,000 COLONIES/mL ENTEROCOCCUS FAECALIS  SARS CORONAVIRUS 2 (TAT 6-24  HRS) Nasopharyngeal Nasopharyngeal Swab     Status: None   Collection Time: 12/31/18  2:31 PM   Specimen: Nasopharyngeal Swab  Result Value Ref Range Status   SARS Coronavirus 2 NEGATIVE NEGATIVE Final    Comment: (NOTE) SARS-CoV-2 target nucleic acids are NOT DETECTED. The SARS-CoV-2 RNA is generally detectable in upper and lower respiratory specimens during the acute phase of infection. Negative results do not preclude SARS-CoV-2 infection, do not rule out co-infections with other pathogens, and should not be used as the sole basis for treatment or other patient management decisions. Negative results must be combined with clinical observations, patient history, and epidemiological information. The expected result is Negative. Fact Sheet for Patients: SugarRoll.be Fact Sheet for Healthcare Providers: https://www.woods-mathews.com/ This test is not yet approved or cleared by the [place] FDA and  has been authorized for detection and/or diagnosis of SARS-CoV-2 by FDA under an Emergency Use Authorization (EUA). This EUA will remain  in effect (meaning this test can be used) for the duration of the COVID-19 declaration under Section 56 4(b)(1) of the Act, 21 U.S.C. section 360bbb-3(b)(1), unless the authorization is terminated or revoked sooner. Performed at [facility], [place] [address], [place], [place] [address]   Blood Culture (routine x 2)     Status: None (Preliminary result)   Collection  Time: 01/03/19  7:19 AM   Specimen: BLOOD  Result Value Ref Range Status   Specimen Description BLOOD LAC  Final   Special Requests   Final    BOTTLES DRAWN AEROBIC AND ANAEROBIC Blood Culture adequate volume   Culture   Final    NO GROWTH 2 DAYS Performed at [facility], [address] [place], [place] [address]    Report Status PENDING  Incomplete  Blood Culture (routine x 2)     Status: None (Preliminary result)   Collection Time: 01/03/19  7:19 AM   Specimen: BLOOD  Result Value Ref Range Status   Specimen Description BLOOD LH  Final   Special Requests   Final    BOTTLES DRAWN AEROBIC AND ANAEROBIC Blood Culture adequate volume   Culture   Final    NO GROWTH 2 DAYS Performed at [facility], [address], [place], [place] [address]    Report Status PENDING  Incomplete  Respiratory Panel by RT PCR (Flu A&B, Covid) - Nasopharyngeal Swab     Status: None   Collection Time: 01/03/19  7:19 AM   Specimen: Nasopharyngeal Swab  Result Value Ref Range Status   SARS Coronavirus 2 by RT PCR NEGATIVE NEGATIVE Final    Comment: (NOTE) SARS-CoV-2 target nucleic acids are NOT DETECTED. The SARS-CoV-2 RNA is generally detectable in upper respiratoy specimens during the acute phase of infection. The lowest concentration of SARS-CoV-2 viral copies this assay can detect is 131 copies/mL. A negative result does not preclude SARS-Cov-2 infection and should not be used as the sole basis for treatment or other patient management decisions. A negative result may occur with  improper specimen collection/handling, submission of specimen other than nasopharyngeal swab, presence of viral mutation(s) within the areas targeted by this assay, and inadequate number of viral copies (<131 copies/mL). A negative result must be combined with clinical observations, patient history, and epidemiological information. The expected result is Negative. Fact Sheet for Patients:   PinkCheek.be Fact Sheet for Healthcare Providers:  GravelBags.it This test is not yet ap proved or cleared by the [place] and  has been authorized for  detection and/or diagnosis of SARS-CoV-2 by FDA under an Emergency Use Authorization (EUA). This EUA will remain  in effect (meaning this test can be used) for the duration of the COVID-19 declaration under Section 564(b)(1) of the Act, 21 U.S.C. section 360bbb-3(b)(1), unless the authorization is terminated or revoked sooner.    Influenza A by PCR NEGATIVE NEGATIVE Final   Influenza B by PCR NEGATIVE NEGATIVE Final    Comment: (NOTE) The Xpert Xpress SARS-CoV-2/FLU/RSV assay is intended as an aid in  the diagnosis of influenza from Nasopharyngeal swab specimens and  should not be used as a sole basis for treatment. Nasal washings and  aspirates are unacceptable for Xpert Xpress SARS-CoV-2/FLU/RSV  testing. Fact Sheet for Patients: PinkCheek.be Fact Sheet for Healthcare Providers: GravelBags.it This test is not yet approved or cleared by the [place] FDA and  has been authorized for detection and/or diagnosis of SARS-CoV-2 by  FDA under an Emergency Use Authorization (EUA). This EUA will remain  in effect (meaning this test can be used) for the duration of the  Covid-19 declaration under Section 564(b)(1) of the Act, 21  U.S.C. section 360bbb-3(b)(1), unless the authorization is  terminated or revoked. Performed at [facility], [address], [place], [place] [address]   Urine culture     Status: None   Collection Time: 01/03/19  7:40 AM   Specimen: In/Out Cath Urine  Result Value Ref Range Status   Specimen Description   Final    IN/OUT CATH URINE Performed at [facility], [address] [place], [place] [address]    Special Requests   Final    NONE Performed at [facility], [address] [place], [place] [address]    Culture   Final    NO GROWTH Performed at [facility], [place] [address] [place], [place] [address]    Report Status 01/04/2019 FINAL  Final  MRSA PCR Screening     Status: None   Collection Time: 01/04/19  1:42 AM   Specimen: Nasal Mucosa; Nasopharyngeal  Result Value Ref Range Status   MRSA by PCR NEGATIVE NEGATIVE Final    Comment:        The GeneXpert MRSA Assay (FDA approved for NASAL specimens only), is one component of a comprehensive MRSA colonization surveillance program. It is not intended to diagnose MRSA infection nor to guide or monitor treatment for MRSA infections. Performed at [facility], [address] [place],  [address]          Radiology Studies: No results found.      Scheduled Meds: . Chlorhexidine Gluconate Cloth  6 each Topical Daily  . dextrose  1 ampule Intravenous Once  . hydrocortisone sod succinate (SOLU-CORTEF) inj  50 mg Intravenous Q6H  . midodrine  10 mg Oral TID WC  . Warfarin - Pharmacist Dosing Inpatient   Does not apply q1800   Continuous Infusions: . albumin human 25 g (01/05/19 1419)  . aztreonam 0.5 g (01/05/19 0947)  . phenylephrine (NEO-SYNEPHRINE) Adult infusion 20 mcg/min (01/04/19 1838)     LOS: 2 days    Time spent: 35 minutes    Sidney Ace, MD [facility] Pager [phone]  If 7PM-7AM, please contact night-coverage [email or web address] Password [facility] 01/05/2019, 4:52 PM

## 2019-01-05 NOTE — Progress Notes (Signed)
Hemodialysis- Tolerated well. UF 2.2L out of 2.5L goal. Goal not met due to hypotension. Patient was given albumin with treatment to keep sbp>90. Responds to voice. No changes in mentation. Reported off to primary RN.

## 2019-01-05 NOTE — Progress Notes (Signed)
CC  Follow up hypoglycemia and shock   HPI Off pressors More alert and awake NAD FSBS improved with D10 HD as planned      VITALS: BP (!) 89/62   Pulse (!) 104   Temp 98 F (36.7 C) (Oral)   Resp 14   Ht 5' (1.524 m)   Wt 106.7 kg   SpO2 98%   BMI 45.94 kg/m     I/O last 3 completed shifts: In: 2969.9 [I.V.:2659.9; IV Piggyback:310] Out: 700 [Other:700] No intake/output data recorded.  SpO2: 98 % O2 Flow Rate (L/min): 2 L/min   Review of Systems:  Gen:  Denies  fever, sweats, chills weigh loss  HEENT: Denies blurred vision, double vision, ear pain, eye pain, hearing loss, nose bleeds, sore throat Cardiac:  No dizziness, chest pain or heaviness, chest tightness,edema, No JVD Resp:   No cough, -sputum production, -shortness of breath,-wheezing, -hemoptysis,  Other:  All other systems negative  Physical Examination:   GENERAL:NAD, no fevers, chills, no weakness no fatigue HEAD: Normocephalic, atraumatic.  EYES: PERLA, EOMI No scleral icterus.  MOUTH: Moist mucosal membrane.  EAR, NOSE, THROAT: Clear without exudates. No external lesions.  NECK: Supple.  PULMONARY: CTA B/L no wheezing, rhonchi, crackles CARDIOVASCULAR: S1 and S2. Regular rate and rhythm. No murmurs GASTROINTESTINAL: Soft, nontender, nondistended. Positive bowel sounds.  MUSCULOSKELETAL: No swelling, clubbing, or edema.  NEUROLOGIC: No gross focal neurological deficits. 5/5 strength all extremities SKIN: No ulceration, lesions, rashes, or cyanosis.  PSYCHIATRIC: Insight, judgment intact. -depression -anxiety ALL OTHER ROS ARE NEGATIVE    I personally reviewed Labs under Results section.   MEDICATIONS: I have reviewed all medications and confirmed regimen as documented   CULTURE RESULTS   Recent Results (from the past 240 hour(s))  Urine Culture     Status: Abnormal   Collection Time: 12/27/18  6:41 AM   Specimen: Urine, Random  Result Value Ref Range Status   Specimen  Description   Final    URINE, RANDOM Performed at [facility], [address] [place], [place] [address]    Special Requests   Final    NONE Performed at [facility], [place], [place] [address]    Culture 80,000 COLONIES/mL ENTEROCOCCUS FAECALIS (A)  Final   Report Status 12/30/2018 FINAL  Final   Organism ID, Bacteria ENTEROCOCCUS FAECALIS (A)  Final      Susceptibility   Enterococcus faecalis - MIC*    AMPICILLIN <=2 SENSITIVE Sensitive     NITROFURANTOIN <=16 SENSITIVE Sensitive     VANCOMYCIN 1 SENSITIVE Sensitive     * 80,000 COLONIES/mL ENTEROCOCCUS FAECALIS  SARS CORONAVIRUS 2 (TAT 6-24 HRS) Nasopharyngeal Nasopharyngeal Swab     Status: None   Collection Time: 12/31/18  2:31 PM   Specimen: Nasopharyngeal Swab  Result Value Ref Range Status   SARS Coronavirus 2 NEGATIVE NEGATIVE Final    Comment: (NOTE) SARS-CoV-2 target nucleic acids are NOT DETECTED. The SARS-CoV-2 RNA is generally detectable in upper and lower respiratory specimens during the acute phase of infection. Negative results do not preclude SARS-CoV-2 infection, do not rule out co-infections with other pathogens, and should not be used as the sole basis for treatment or other patient management decisions. Negative results must be combined with clinical observations, patient history, and epidemiological information. The expected result is Negative. Fact Sheet for Patients: SugarRoll.be Fact Sheet for Healthcare Providers: https://www.woods-mathews.com/ This test is not yet approved or cleared by the [place] FDA and  has been authorized for detection and/or diagnosis of SARS-CoV-2 by FDA under an Emergency Use Authorization (EUA). This EUA will remain  in effect (meaning this test can be used) for the duration of the COVID-19 declaration under Section 56 4(b)(1) of the Act, 21 U.S.C. section 360bbb-3(b)(1), unless the  authorization is terminated or revoked sooner. Performed at [facility], [place] [address], [place], [place] [address]   Blood Culture (routine x 2)     Status: None (Preliminary result)   Collection Time: 01/03/19  7:19 AM   Specimen: BLOOD  Result Value Ref Range Status   Specimen Description BLOOD LAC  Final   Special Requests   Final    BOTTLES DRAWN AEROBIC AND ANAEROBIC Blood Culture adequate volume   Culture   Final    NO GROWTH 2 DAYS Performed at [facility], [address] [place], [place] [address]    Report Status PENDING  Incomplete  Blood Culture (routine x 2)     Status: None (Preliminary result)   Collection Time: 01/03/19  7:19 AM   Specimen: BLOOD  Result Value Ref Range Status   Specimen Description BLOOD LH  Final   Special Requests   Final    BOTTLES DRAWN AEROBIC AND ANAEROBIC Blood Culture adequate volume   Culture   Final    NO GROWTH 2 DAYS Performed at [facility], [address], [place], [place] [address]    Report Status PENDING  Incomplete  Respiratory Panel by RT PCR (Flu A&B, Covid) - Nasopharyngeal Swab     Status: None   Collection Time: 01/03/19  7:19 AM   Specimen: Nasopharyngeal Swab  Result Value Ref Range Status   SARS Coronavirus 2 by RT PCR NEGATIVE NEGATIVE Final    Comment: (NOTE) SARS-CoV-2 target nucleic acids are NOT DETECTED. The SARS-CoV-2 RNA is generally detectable in upper respiratoy specimens during the acute phase of infection. The lowest concentration of SARS-CoV-2 viral copies this assay can detect is 131 copies/mL. A negative result does not preclude SARS-Cov-2 infection and should not be used as the sole basis for treatment or other patient management decisions. A negative result may occur with  improper specimen collection/handling, submission of specimen other than nasopharyngeal swab, presence of viral mutation(s) within the areas targeted by this assay, and inadequate number of viral  copies (<131 copies/mL). A negative result must be combined with clinical observations, patient history, and epidemiological information. The expected result is Negative. Fact Sheet for Patients:  PinkCheek.be Fact Sheet for Healthcare Providers:  GravelBags.it This test is not yet ap proved or cleared by the [place] FDA and  has been authorized for detection and/or diagnosis of SARS-CoV-2 by FDA under an Emergency Use Authorization (EUA). This EUA will remain  in effect (meaning this test can be used) for the duration of the COVID-19 declaration under Section 564(b)(1) of the Act, 21 U.S.C. section 360bbb-3(b)(1), unless the authorization is terminated or revoked sooner.    Influenza A by PCR NEGATIVE NEGATIVE Final   Influenza B by PCR NEGATIVE NEGATIVE Final    Comment: (NOTE) The Xpert Xpress SARS-CoV-2/FLU/RSV assay is intended as an aid in  the diagnosis of influenza from Nasopharyngeal swab specimens and  should not be used as a sole basis for treatment. Nasal washings and  aspirates are unacceptable for Xpert Xpress SARS-CoV-2/FLU/RSV  testing. Fact Sheet for Patients: PinkCheek.be Fact Sheet for Healthcare Providers: GravelBags.it This test is not yet approved or cleared by the [place]  States FDA and  has been authorized for detection and/or diagnosis of SARS-CoV-2 by  FDA under an Emergency Use Authorization (EUA). This EUA will remain  in effect (meaning this test can be used) for the duration of the  Covid-19 declaration under Section 564(b)(1) of the Act, 21  U.S.C. section 360bbb-3(b)(1), unless the authorization is  terminated or revoked. Performed at [facility], [address] [place], [place] [address]   Urine culture     Status: None   Collection Time: 01/03/19  7:40 AM   Specimen: In/Out Cath Urine  Result Value Ref Range  Status   Specimen Description   Final    IN/OUT CATH URINE Performed at [facility], [address] [place], [place] [address]    Special Requests   Final    NONE Performed at [facility], [address] [place], [place] [address]    Culture   Final    NO GROWTH Performed at [facility], [place] [address] [place], [place] [address]    Report Status 01/04/2019 FINAL  Final  MRSA PCR Screening     Status: None   Collection Time: 01/04/19  1:42 AM   Specimen: Nasal Mucosa; Nasopharyngeal  Result Value Ref Range Status   MRSA by PCR NEGATIVE NEGATIVE Final    Comment:        The GeneXpert MRSA Assay (FDA approved for NASAL specimens only), is one component of a comprehensive MRSA colonization surveillance program. It is not intended to diagnose MRSA infection nor to guide or monitor treatment for MRSA infections. Performed at [facility], [address] [place], [place] [address]             ASSESSMENT AND PLAN SYNOPSIS  78 yo Patient with persistent hypoglycemia and hypotension with ESRD and liver cirrhosis with toxic metabolic encephalopathy from ESRD renal failure and HYPOGLYCEMIA FROM Liver failure with adrenal insufficiancey with probable shock sepsis from UTI  Wean off pressors as tolerated Continue stress dose steroids Continue IV aBX Continue d10 infusiuon DNR/DNI Recommend Palliative care consultations    Georjean Toya Patricia Pesa, M.D.  [facility]  Medical Director [place] Director [facility] Cardio-Pulmonary Department

## 2019-01-06 ENCOUNTER — Inpatient Hospital Stay: Payer: Medicare Other | Admitting: Adult Health

## 2019-01-06 DIAGNOSIS — Z7189 Other specified counseling: Secondary | ICD-10-CM

## 2019-01-06 DIAGNOSIS — Z515 Encounter for palliative care: Secondary | ICD-10-CM

## 2019-01-06 LAB — CBC
HCT: 38.1 % (ref 36.0–46.0)
Hemoglobin: 12.4 g/dL (ref 12.0–15.0)
MCH: 31.1 pg (ref 26.0–34.0)
MCHC: 32.5 g/dL (ref 30.0–36.0)
MCV: 95.5 fL (ref 80.0–100.0)
Platelets: 175 10*3/uL (ref 150–400)
RBC: 3.99 MIL/uL (ref 3.87–5.11)
RDW: 15.7 % — ABNORMAL HIGH (ref 11.5–15.5)
WBC: 13.3 10*3/uL — ABNORMAL HIGH (ref 4.0–10.5)
nRBC: 2 % — ABNORMAL HIGH (ref 0.0–0.2)

## 2019-01-06 LAB — PROTIME-INR
INR: 2.3 — ABNORMAL HIGH (ref 0.8–1.2)
Prothrombin Time: 25.6 seconds — ABNORMAL HIGH (ref 11.4–15.2)

## 2019-01-06 LAB — RENAL FUNCTION PANEL
Albumin: 2.9 g/dL — ABNORMAL LOW (ref 3.5–5.0)
Anion gap: 12 (ref 5–15)
BUN: 38 mg/dL — ABNORMAL HIGH (ref 8–23)
CO2: 30 mmol/L (ref 22–32)
Calcium: 7.6 mg/dL — ABNORMAL LOW (ref 8.9–10.3)
Chloride: 92 mmol/L — ABNORMAL LOW (ref 98–111)
Creatinine, Ser: 4.72 mg/dL — ABNORMAL HIGH (ref 0.44–1.00)
GFR calc Af Amer: 10 mL/min — ABNORMAL LOW (ref >60)
GFR calc non Af Amer: 8 mL/min — ABNORMAL LOW (ref >60)
Glucose, Bld: 158 mg/dL — ABNORMAL HIGH (ref 70–99)
Phosphorus: 5.2 mg/dL — ABNORMAL HIGH (ref 2.5–4.6)
Potassium: 4.7 mmol/L (ref 3.5–5.1)
Sodium: 134 mmol/L — ABNORMAL LOW (ref 135–145)

## 2019-01-06 LAB — GLUCOSE, CAPILLARY
Glucose-Capillary: 111 mg/dL — ABNORMAL HIGH (ref 70–99)
Glucose-Capillary: 94 mg/dL (ref 70–99)
Glucose-Capillary: 138 mg/dL — ABNORMAL HIGH (ref 70–99)

## 2019-01-06 LAB — INSULIN AND C-PEPTIDE, SERUM
C-Peptide: 17.1 ng/mL — ABNORMAL HIGH (ref 1.1–4.4)
Insulin: 23.7 u[IU]/mL (ref 2.6–24.9)

## 2019-01-06 MED ORDER — PROMETHAZINE HCL 25 MG PO TABS: 25.0000 mg | ORAL_TABLET | Freq: Four times a day (QID) | ORAL | Status: DC | PRN

## 2019-01-06 MED ORDER — KETOROLAC TROMETHAMINE 15 MG/ML IJ SOLN
15.0000 mg | Freq: Once | INTRAMUSCULAR | Status: AC
  Administered 2019-01-06: 15 mg via INTRAVENOUS
  Filled 2019-01-06: qty 1

## 2019-01-06 MED ORDER — ALPRAZOLAM 0.25 MG PO TABS
0.2500 mg | ORAL_TABLET | Freq: Three times a day (TID) | ORAL | Status: DC | PRN
  Administered 2019-01-06 – 2019-01-07 (×2): 0.25 mg via ORAL
  Filled 2019-01-06 (×2): qty 1

## 2019-01-06 MED ORDER — WARFARIN SODIUM 2 MG PO TABS
2.0000 mg | ORAL_TABLET | Freq: Once | ORAL | Status: AC
  Administered 2019-01-06: 2 mg via ORAL
  Filled 2019-01-06: qty 1

## 2019-01-06 NOTE — Consult Note (Signed)
ANTICOAGULATION CONSULT NOTE  Pharmacy Consult for Warfarin Indication: atrial fibrillation  Patient Measurements: Height: 5' (152.4 cm) Weight: 237 lb 7 oz (107.7 kg) IBW/kg (Calculated) : 45.5  Vital Signs: Temp: 98.2 F (36.8 C) (12/23 0508) Temp Source: Oral (12/23 0508) BP: 105/58 (12/23 0508) Pulse Rate: 82 (12/23 0508)  Labs: Recent Labs    01/03/19 0719 01/04/19 0409 01/05/19 1331  HGB 14.1 11.6* 12.5  HCT 42.0 36.3 38.2  PLT 193 166 165  APTT 39*  --   --   LABPROT 23.1* 29.1* 23.7*  INR 2.1* 2.8* 2.1*  CREATININE 6.10* 4.40* 3.94*    Estimated Creatinine Clearance: 13.3 mL/min (A) (by C-G formula based on SCr of 3.94 mg/dL (H)).   Medical History: Past Medical History:  Diagnosis Date  . Erythrocytosis 06/05/2014  . History of radiation therapy   . Leukocytosis 06/05/2014  . Rectal cancer ([place]) 05/2002    Medications:  Warfarin 2 mg daily at 6 pm last out-patient dose 12/19 per nursing home [facility]  Assessment: Patient is a 78 y/o female with history of atrial fibrillation on warfarin, type 2 diabetes mellitus, hypertension, AKI on CKD on HD, cirrhosis, colon cancer s/p chemotherapy and resection who presented to [facility] ED 12/20 with chief complaint of apnea and hypoglycemia. She was found to be septic and started on broad spectrum antibiotics. At home she takes 2 mg warfarin daily. H&H, PLT wnl. She received received Flagyl x 1 dose 12/20    Date   INR   Dose 12/10   1.3    2 mg 12/11   1.3   2 mg 12/12   1.3   2.5 mg 12/13   1.4   2.5 mg 12/14   1.5   No dose 12/15   2.0   2.5 mg 12/16   2.2   2 mg 12/17   2.0   2.5 mg 12/18   2.1   2 mg 12/20   2.1   No dose 12/21   2.8   No dose 12/22     2.1         2 mg 12/23   2.3   2 mg   Goal of Therapy:  INR 2-3   Plan:   repeat warfarin 2 mg this evening  INR with AM labs  CBC in am  Dallie Piles, PharmD 01/06/2019,7:19 AM

## 2019-01-06 NOTE — TOC Progression Note (Signed)
Transition of Care [facility]) - Progression Note    Patient Details  Name: Danielle Rowland MRN: [id number] Date of Birth: 1940-12-07  [facility] CM/SW Contact  Beverly Sessions, RN Phone Number: 01/06/2019, 2:36 PM  Clinical Narrative:     Family meeting today with palliative.  Awaiting follow up from meeting        Expected Discharge Plan and Services                                                 Social Determinants of Health (SDOH) Interventions    Readmission Risk Interventions Readmission Risk Prevention Plan 01/05/2019  Transportation Screening Complete  PCP or Specialist Appt within 3-5 Days Complete  Social Work Consult for [place] Planning/Counseling [place] Not Applicable  Medication Review Press photographer) Complete  Some recent data might be hidden

## 2019-01-06 NOTE — Care Management Important Message (Signed)
Important Message  Patient Details  Name: Maudell Stanbrough MRN: [id number] Date of Birth: 1940-08-03   Medicare Important Message Given:  Yes     [name] 01/06/2019, 11:07 AM

## 2019-01-06 NOTE — Progress Notes (Signed)
[facility]  ROUNDING NOTE   Subjective:   Brought down for dialysis. Nauseous on dialysis treatment. UF rate of 3 liters  Hemodialysis treatment yesterday, tolerated treatment well. UF of 3264mL  Objective:  Vital signs in last 24 hours:  Temp:  [97.6 F (36.4 C)-98.2 F (36.8 C)] 98.2 F (36.8 C) (12/23 0508) Pulse Rate:  [71-160] 82 (12/23 0508) Resp:  [10-26] 18 (12/23 0508) BP: (89-112)/(50-87) 105/58 (12/23 0508) SpO2:  [96 %-99 %] 96 % (12/23 0508) Weight:  [105.1 kg-107.7 kg] 107.7 kg (12/23 0500)  Weight change: 0.9 kg Filed Weights   01/05/19 1255 01/05/19 1607 01/06/19 0500  Weight: 107.6 kg 105.1 kg 107.7 kg    Intake/Output: I/O last 3 completed shifts: In: 1503.3 [I.V.:1339.9; IV Piggyback:163.4] Out: 3289 [Other:3289]   Intake/Output this shift:  No intake/output data recorded.  Physical Exam: General: Ill appearing  Head: Normocephalic, atraumatic.   Eyes: Anicteric, PERRL  Neck: Supple, trachea midline  Lungs:  Clear to auscultation  Heart: irregular  Abdomen:  Soft, nontender, obese  Extremities:  + weeping peripheral edema.  Neurologic: Alert and oriented  Skin: No lesions  Access: LIJ permcath    Basic Metabolic Panel: Recent Labs  Lab 12/31/18 0620 01/01/19 0827 01/03/19 0719 01/04/19 0409 01/05/19 1331 01/06/19 1220  NA 133* 136 134* 134* 134* 134*  K 4.4 4.5 5.0 3.9 4.1 4.7  CL 92* 95* 95* 98 94* 92*  CO2 21* 22 25 25 30 30   GLUCOSE 105* 121* 23* 110* 91 158*  BUN 67* 50* 58* 38* 32* 38*  CREATININE 7.51* 6.06* 6.10* 4.40* 3.94* 4.72*  CALCIUM 6.6* 7.4* 7.2* 6.6* 6.8* 7.6*  PHOS 6.5* 5.4*  --   --  4.2 5.2*    Liver Function Tests: Recent Labs  Lab 12/31/18 0620 01/01/19 0827 01/03/19 0719 01/05/19 1331 01/06/19 1220  AST  --   --  19 13*  --   ALT  --   --  29 19  --   ALKPHOS  --   --  226* 137*  --   BILITOT  --   --  1.3* 1.2  --   PROT  --   --  6.0* 5.6*  --   ALBUMIN 2.3* 2.2* 2.2* 2.6*   2.6* 2.9*   No results for input(s): LIPASE, AMYLASE in the last 168 hours. Recent Labs  Lab 01/04/19 1509  AMMONIA 26    CBC: Recent Labs  Lab 01/03/19 0719 01/04/19 0409 01/05/19 1331 01/06/19 1220  WBC 18.0* 16.4* 11.4* 13.3*  NEUTROABS 16.0*  --  10.6*  --   HGB 14.1 11.6* 12.5 12.4  HCT 42.0 36.3 38.2 38.1  MCV 90.9 94.5 94.3 95.5  PLT 193 166 165 175    Cardiac Enzymes: No results for input(s): CKTOTAL, CKMB, CKMBINDEX, TROPONINI in the last 168 hours.  BNP: Invalid input(s): POCBNP  CBG: Recent Labs  Lab 01/05/19 1932 01/05/19 2212 01/05/19 2333 01/06/19 0341 01/06/19 0746  GLUCAP 106* 173* 142* 111* 58    Microbiology: Results for orders placed or performed during the hospital encounter of 01/03/19  Blood Culture (routine x 2)     Status: None (Preliminary result)   Collection Time: 01/03/19  7:19 AM   Specimen: BLOOD  Result Value Ref Range Status   Specimen Description BLOOD LAC  Final   Special Requests   Final    BOTTLES DRAWN AEROBIC AND ANAEROBIC Blood Culture adequate volume   Culture   Final  NO GROWTH 3 DAYS Performed at [facility], [place] [place], [place] [address]    Report Status PENDING  Incomplete  Blood Culture (routine x 2)     Status: None (Preliminary result)   Collection Time: 01/03/19  7:19 AM   Specimen: BLOOD  Result Value Ref Range Status   Specimen Description BLOOD LH  Final   Special Requests   Final    BOTTLES DRAWN AEROBIC AND ANAEROBIC Blood Culture adequate volume   Culture   Final    NO GROWTH 3 DAYS Performed at [facility], [address] [place], [place] [address]    Report Status PENDING  Incomplete  Respiratory Panel by RT PCR (Flu A&B, Covid) - Nasopharyngeal Swab     Status: None   Collection Time: 01/03/19  7:19 AM   Specimen: Nasopharyngeal Swab  Result Value Ref Range Status   SARS Coronavirus 2 by RT PCR NEGATIVE NEGATIVE Final    Comment: (NOTE) SARS-CoV-2  target nucleic acids are NOT DETECTED. The SARS-CoV-2 RNA is generally detectable in upper respiratoy specimens during the acute phase of infection. The lowest concentration of SARS-CoV-2 viral copies this assay can detect is 131 copies/mL. A negative result does not preclude SARS-Cov-2 infection and should not be used as the sole basis for treatment or other patient management decisions. A negative result may occur with  improper specimen collection/handling, submission of specimen other than nasopharyngeal swab, presence of viral mutation(s) within the areas targeted by this assay, and inadequate number of viral copies (<131 copies/mL). A negative result must be combined with clinical observations, patient history, and epidemiological information. The expected result is Negative. Fact Sheet for Patients:  PinkCheek.be Fact Sheet for Healthcare Providers:  GravelBags.it This test is not yet ap proved or cleared by the [place] FDA and  has been authorized for detection and/or diagnosis of SARS-CoV-2 by FDA under an Emergency Use Authorization (EUA). This EUA will remain  in effect (meaning this test can be used) for the duration of the COVID-19 declaration under Section 564(b)(1) of the Act, 21 U.S.C. section 360bbb-3(b)(1), unless the authorization is terminated or revoked sooner.    Influenza A by PCR NEGATIVE NEGATIVE Final   Influenza B by PCR NEGATIVE NEGATIVE Final    Comment: (NOTE) The Xpert Xpress SARS-CoV-2/FLU/RSV assay is intended as an aid in  the diagnosis of influenza from Nasopharyngeal swab specimens and  should not be used as a sole basis for treatment. Nasal washings and  aspirates are unacceptable for Xpert Xpress SARS-CoV-2/FLU/RSV  testing. Fact Sheet for Patients: PinkCheek.be Fact Sheet for Healthcare Providers: GravelBags.it This test is  not yet approved or cleared by the [place] FDA and  has been authorized for detection and/or diagnosis of SARS-CoV-2 by  FDA under an Emergency Use Authorization (EUA). This EUA will remain  in effect (meaning this test can be used) for the duration of the  Covid-19 declaration under Section 564(b)(1) of the Act, 21  U.S.C. section 360bbb-3(b)(1), unless the authorization is  terminated or revoked. Performed at [facility], [address], [place], [place] [address]   Urine culture     Status: None   Collection Time: 01/03/19  7:40 AM   Specimen: In/Out Cath Urine  Result Value Ref Range Status   Specimen Description   Final    IN/OUT CATH URINE Performed at [facility], [address] [place], [place] [address]    Special Requests   Final  NONE Performed at [facility], [address] [place], [place] [address]    Culture   Final    NO GROWTH Performed at [facility], [place] [address] [place], [place] [address]    Report Status 01/04/2019 FINAL  Final  MRSA PCR Screening     Status: None   Collection Time: 01/04/19  1:42 AM   Specimen: Nasal Mucosa; Nasopharyngeal  Result Value Ref Range Status   MRSA by PCR NEGATIVE NEGATIVE Final    Comment:        The GeneXpert MRSA Assay (FDA approved for NASAL specimens only), is one component of a comprehensive MRSA colonization surveillance program. It is not intended to diagnose MRSA infection nor to guide or monitor treatment for MRSA infections. Performed at [facility], [address] [place], [place] [address]     Coagulation Studies: Recent Labs    01/04/19 0409 01/05/19 1331 01/06/19 1220  LABPROT 29.1* 23.7* 25.6*  INR 2.8* 2.1* 2.3*    Urinalysis: No results for input(s): COLORURINE, LABSPEC, PHURINE, GLUCOSEU, HGBUR, BILIRUBINUR, KETONESUR, PROTEINUR, UROBILINOGEN, NITRITE, LEUKOCYTESUR in the last 72 hours.  Invalid input(s): APPERANCEUR     Imaging: No results found.   Medications:   . albumin human 25 g (01/05/19 1419)  . aztreonam 0.5 g (01/06/19 1026)   . Chlorhexidine Gluconate Cloth  6 each Topical Daily  . dextrose  1 ampule Intravenous Once  . hydrocortisone sod succinate (SOLU-CORTEF) inj  50 mg Intravenous Q6H  . midodrine  10 mg Oral TID WC  . warfarin  2 mg Oral ONCE-1800  . Warfarin - Pharmacist Dosing Inpatient   Does not apply q1800   acetaminophen **OR** acetaminophen, albumin human, dextrose, metoprolol tartrate, ondansetron **OR** ondansetron (ZOFRAN) IV  Assessment/ Plan:  [name] is a 78 y.o. white female with acute renal failure requiring hemodialysis, hepatic cirrhosis, diabetes mellitus type II, atrial fibrillation, hypertension, admitted on 01/03/2019 with altered mental status Discharged from [facility] on 12/19 to [place] [facility] [place]  1. Acute Kidney Failure requiring outpatient hemodialysis: emergent hemodialysis treatment on admission.  Baseline creatinine of 0.89 on 04/15/2018. Serologic work up negative on 12/30/18.  Chronic obstructive uropathy on ultrasound on 12/9 Evaluate for dialysis daily.  Dialysis today due to anasarca and volume overload.  - IV albumin   2. Hypotension and sepsis weaned off vasopressors. Placed on steroids.   Cultures with no growth this admission.  - placed on midodrine - holding metoprolol - broad spectrum antibiotics: aztreonam, metronidazole and vancomycin (allergic to pcn and sulfa)  3. Anemia with chronic kidney disease: hemoglobin stable at 12.4. No indication for EPO  4. Secondary Hyperparathyroidism with hypocalcemia: PTH 517.  Not currently on binders  5. Altered mental status: improved. Metabolic etiology. Improved today.   6. Hepatic cirrhosis with anasarca:  - IV albumin administration.    LOS: 3 Sila Sarsfield 12/23/20201:18 PM

## 2019-01-06 NOTE — Progress Notes (Signed)
PROGRESS NOTE    Danielle Rowland  HYW:[id number] DOB: Oct 31, 1940 DOA: 01/03/2019 PCP: Birdie Sons, MD    Brief Narrative:  Danielle Roach Andersonis a 78 y.o.femalewith medical history significant ofESRD-HD,hypertension, diabetes mellitus, rectal cancer (resection:), CLL, splenectomy, liver cirrhosis, who presents with unresponsiveness.  Patient has AMS,and isunable to provide medical history, therefore, most of the history is obtained by discussing the case with ED physician, per EMS report, and with the nursing staff.  [facility] found to be unresponsive this morning. Pt is anew resident at [facility] only there proximately 24 hours per report.EMS reports blood sugar 68and pt wasoccasional episodes of apnea.Her blood sugar was 23 initially and repeated test showed that her blood sugar was 10 in ED. Pt was give D50 and D5 gtt, her blood sugar improved to 200 and then decreased to 90s again. When I saw pt in ED, she is minimally responsive. Shemoves all extremities upon painful stimuli. No active cough, nausea, vomiting, diarrhea noted. Not sure if patient has any chest pain or abdominal pain. Patient has 3+ leg edema.     Consultants:   Nephrology  Procedures: Hemodialysis  Antimicrobials:   Aztreonam   Subjective: Palliative care at bedside.  Patient sitting in bed.  States feels better.  Denies any chest pain, shortness of breath, or any other complaints.  Responds appropriately.  Objective: Vitals:   01/05/19 1900 01/05/19 2000 01/06/19 0500 01/06/19 0508  BP: 100/64 97/71  (!) 105/58  Pulse: (!) 101 (!) 160  82  Resp: 20 13  18   Temp:  98.1 F (36.7 C)  98.2 F (36.8 C)  TempSrc:  Oral  Oral  SpO2: 98% 98%  96%  Weight:   107.7 kg   Height:        Intake/Output Summary (Last 24 hours) at 01/06/2019 1340 Last data filed at 01/05/2019 1607 Gross per 24 hour  Intake --  Output 3289 ml  Net -3289 ml    Filed Weights   01/05/19 1255 01/05/19 1607 01/06/19 0500  Weight: 107.6 kg 105.1 kg 107.7 kg    Examination:  General exam: Appears calm and comfortable, NAD Respiratory system: Clear to auscultation.  Poor respiratory effort normal. Cardiovascular system: S1 & S2 heard, RRR. No JVD, murmurs, rubs, gallops or clicks.  Gastrointestinal system: Abdomen is distended, with edema on palpation.  soft and nontender.Normal bowel sounds heard. Central nervous system: Alert and oriented. No focal neurological deficits. Extremities: Edema UE and LE x4, small areas of weeping on b/l ue Skin: warm Psychiatry:  Mood & affect appropriate.     Data Reviewed: I have personally reviewed following labs and imaging studies  CBC: Recent Labs  Lab 01/03/19 0719 01/04/19 0409 01/05/19 1331 01/06/19 1220  WBC 18.0* 16.4* 11.4* 13.3*  NEUTROABS 16.0*  --  10.6*  --   HGB 14.1 11.6* 12.5 12.4  HCT 42.0 36.3 38.2 38.1  MCV 90.9 94.5 94.3 95.5  PLT 193 166 165 485   Basic Metabolic Panel: Recent Labs  Lab 12/31/18 0620 01/01/19 0827 01/03/19 0719 01/04/19 0409 01/05/19 1331 01/06/19 1220  NA 133* 136 134* 134* 134* 134*  K 4.4 4.5 5.0 3.9 4.1 4.7  CL 92* 95* 95* 98 94* 92*  CO2 21* 22 25 25 30 30   GLUCOSE 105* 121* 23* 110* 91 158*  BUN 67* 50* 58* 38* 32* 38*  CREATININE 7.51* 6.06* 6.10* 4.40* 3.94* 4.72*  CALCIUM 6.6* 7.4* 7.2* 6.6*  6.8* 7.6*  PHOS 6.5* 5.4*  --   --  4.2 5.2*   GFR: Estimated Creatinine Clearance: 11.1 mL/min (A) (by C-G formula based on SCr of 4.72 mg/dL (H)). Liver Function Tests: Recent Labs  Lab 12/31/18 0620 01/01/19 0827 01/03/19 0719 01/05/19 1331 01/06/19 1220  AST  --   --  19 13*  --   ALT  --   --  29 19  --   ALKPHOS  --   --  226* 137*  --   BILITOT  --   --  1.3* 1.2  --   PROT  --   --  6.0* 5.6*  --   ALBUMIN 2.3* 2.2* 2.2* 2.6*  2.6* 2.9*   No results for input(s): LIPASE, AMYLASE in the last 168 hours. Recent Labs  Lab  01/04/19 1509  AMMONIA 26   Coagulation Profile: Recent Labs  Lab 01/01/19 0827 01/03/19 0719 01/04/19 0409 01/05/19 1331 01/06/19 1220  INR 2.1* 2.1* 2.8* 2.1* 2.3*   Cardiac Enzymes: No results for input(s): CKTOTAL, CKMB, CKMBINDEX, TROPONINI in the last 168 hours. BNP (last 3 results) No results for input(s): PROBNP in the last 8760 hours. HbA1C: No results for input(s): HGBA1C in the last 72 hours. CBG: Recent Labs  Lab 01/05/19 1932 01/05/19 2212 01/05/19 2333 01/06/19 0341 01/06/19 0746  GLUCAP 106* 173* 142* 111* 94   Lipid Profile: No results for input(s): CHOL, HDL, LDLCALC, TRIG, CHOLHDL, LDLDIRECT in the last 72 hours. Thyroid Function Tests: No results for input(s): TSH, T4TOTAL, FREET4, T3FREE, THYROIDAB in the last 72 hours. Anemia Panel: No results for input(s): VITAMINB12, FOLATE, FERRITIN, TIBC, IRON, RETICCTPCT in the last 72 hours. Sepsis Labs: Recent Labs  Lab 01/03/19 0719 01/04/19 0410  PROCALCITON 5.59  --   LATICACIDVEN 1.6 1.8    Recent Results (from the past 240 hour(s))  SARS CORONAVIRUS 2 (TAT 6-24 HRS) Nasopharyngeal Nasopharyngeal Swab     Status: None   Collection Time: 12/31/18  2:31 PM   Specimen: Nasopharyngeal Swab  Result Value Ref Range Status   SARS Coronavirus 2 NEGATIVE NEGATIVE Final    Comment: (NOTE) SARS-CoV-2 target nucleic acids are NOT DETECTED. The SARS-CoV-2 RNA is generally detectable in upper and lower respiratory specimens during the acute phase of infection. Negative results do not preclude SARS-CoV-2 infection, do not rule out co-infections with other pathogens, and should not be used as the sole basis for treatment or other patient management decisions. Negative results must be combined with clinical observations, patient history, and epidemiological information. The expected result is Negative. Fact Sheet for Patients: SugarRoll.be Fact Sheet for Healthcare  Providers: https://www.woods-mathews.com/ This test is not yet approved or cleared by the [place] FDA and  has been authorized for detection and/or diagnosis of SARS-CoV-2 by FDA under an Emergency Use Authorization (EUA). This EUA will remain  in effect (meaning this test can be used) for the duration of the COVID-19 declaration under Section 56 4(b)(1) of the Act, 21 U.S.C. section 360bbb-3(b)(1), unless the authorization is terminated or revoked sooner. Performed at [facility], [place] [address], [place], [place] [address]   Blood Culture (routine x 2)     Status: None (Preliminary result)   Collection Time: 01/03/19  7:19 AM   Specimen: BLOOD  Result Value Ref Range Status   Specimen Description BLOOD LAC  Final   Special Requests   Final    BOTTLES DRAWN AEROBIC AND ANAEROBIC Blood Culture adequate volume   Culture  Final    NO GROWTH 3 DAYS Performed at [facility], [address] [place], [place] [address]    Report Status PENDING  Incomplete  Blood Culture (routine x 2)     Status: None (Preliminary result)   Collection Time: 01/03/19  7:19 AM   Specimen: BLOOD  Result Value Ref Range Status   Specimen Description BLOOD LH  Final   Special Requests   Final    BOTTLES DRAWN AEROBIC AND ANAEROBIC Blood Culture adequate volume   Culture   Final    NO GROWTH 3 DAYS Performed at [facility], [address] [place], [place] [address]    Report Status PENDING  Incomplete  Respiratory Panel by RT PCR (Flu A&B, Covid) - Nasopharyngeal Swab     Status: None   Collection Time: 01/03/19  7:19 AM   Specimen: Nasopharyngeal Swab  Result Value Ref Range Status   SARS Coronavirus 2 by RT PCR NEGATIVE NEGATIVE Final    Comment: (NOTE) SARS-CoV-2 target nucleic acids are NOT DETECTED. The SARS-CoV-2 RNA is generally detectable in upper respiratoy specimens during the acute phase of infection. The lowest concentration of SARS-CoV-2  viral copies this assay can detect is 131 copies/mL. A negative result does not preclude SARS-Cov-2 infection and should not be used as the sole basis for treatment or other patient management decisions. A negative result may occur with  improper specimen collection/handling, submission of specimen other than nasopharyngeal swab, presence of viral mutation(s) within the areas targeted by this assay, and inadequate number of viral copies (<131 copies/mL). A negative result must be combined with clinical observations, patient history, and epidemiological information. The expected result is Negative. Fact Sheet for Patients:  PinkCheek.be Fact Sheet for Healthcare Providers:  GravelBags.it This test is not yet ap proved or cleared by the [place] FDA and  has been authorized for detection and/or diagnosis of SARS-CoV-2 by FDA under an Emergency Use Authorization (EUA). This EUA will remain  in effect (meaning this test can be used) for the duration of the COVID-19 declaration under Section 564(b)(1) of the Act, 21 U.S.C. section 360bbb-3(b)(1), unless the authorization is terminated or revoked sooner.    Influenza A by PCR NEGATIVE NEGATIVE Final   Influenza B by PCR NEGATIVE NEGATIVE Final    Comment: (NOTE) The Xpert Xpress SARS-CoV-2/FLU/RSV assay is intended as an aid in  the diagnosis of influenza from Nasopharyngeal swab specimens and  should not be used as a sole basis for treatment. Nasal washings and  aspirates are unacceptable for Xpert Xpress SARS-CoV-2/FLU/RSV  testing. Fact Sheet for Patients: PinkCheek.be Fact Sheet for Healthcare Providers: GravelBags.it This test is not yet approved or cleared by the [place] FDA and  has been authorized for detection and/or diagnosis of SARS-CoV-2 by  FDA under an Emergency Use Authorization (EUA). This EUA will  remain  in effect (meaning this test can be used) for the duration of the  Covid-19 declaration under Section 564(b)(1) of the Act, 21  U.S.C. section 360bbb-3(b)(1), unless the authorization is  terminated or revoked. Performed at [facility], [address] [place], [place] [address]   Urine culture     Status: None   Collection Time: 01/03/19  7:40 AM   Specimen: In/Out Cath Urine  Result Value Ref Range Status   Specimen Description   Final    IN/OUT CATH URINE Performed at [facility], [address], [place],  [address]    Special Requests  Final    NONE Performed at [facility], [address], [place], [place] [address]    Culture   Final    NO GROWTH Performed at [facility], [place] [address], [place], [place] [address]    Report Status 01/04/2019 FINAL  Final  MRSA PCR Screening     Status: None   Collection Time: 01/04/19  1:42 AM   Specimen: Nasal Mucosa; Nasopharyngeal  Result Value Ref Range Status   MRSA by PCR NEGATIVE NEGATIVE Final    Comment:        The GeneXpert MRSA Assay (FDA approved for NASAL specimens only), is one component of a comprehensive MRSA colonization surveillance program. It is not intended to diagnose MRSA infection nor to guide or monitor treatment for MRSA infections. Performed at [facility], [address] [place], [place] [address]          Radiology Studies: No results found.      Scheduled Meds: . Chlorhexidine Gluconate Cloth  6 each Topical Daily  . dextrose  1 ampule Intravenous Once  . hydrocortisone sod succinate (SOLU-CORTEF) inj  50 mg Intravenous Q6H  . midodrine  10 mg Oral TID WC  . warfarin  2 mg Oral ONCE-1800  . Warfarin - Pharmacist Dosing Inpatient   Does not apply q1800   Continuous Infusions: . albumin human 25 g (01/05/19 1419)  . aztreonam 0.5 g (01/06/19 1026)    Assessment & Plan:   Principal Problem:    Unresponsiveness Active Problems:   Hypertension   Anxiety   Septic shock (HCC)   Atrial fibrillation (HCC)   Hypoglycemia   Hypothermia   ESRD on dialysis ([employer])   DNR (do not resuscitate)   Type II diabetes mellitus with renal manifestations ([place])   Altered mental status   Septic shock likely secondary to UTI Patient meets criteria for sepsis. Patient has worsening leukocytosis with WBC 18.0 (WBC 14.1 on 12/20/2018, 9.7 on 04/15/18),hypothermia, tachycardia.Procalcitonin 5.59. Lactic acid 1.6.  Patient will unresponsiveness and altered mental status.  UA indicative of infection. Chest x-ray showedsymmetricsomeopacity at the left base. Since patient iss/p ofsplenectomy, at high risk of deteriorating,need broad antibiotics coverage. ICU consulted on 01/04/2023 evaluation for pressor support -Wean off pressors as tolerated -Continue stress dose steroids - vanco andaztreonam (patient received 1 dose of Flagyl in ED) (Start date: 01/03/19-   ) Urine culture with Enterococcus faecalis, blood cultures pending - Follow-up blood culture, urine culture, urinalysis   Unresponsiveness:(resolved) Possibly due to hypoglycemia.CT head is negative for acute intracranial abnormalities. -[facility], now on medical floor -Frequent neuro checks -Maintain adequate blood glucose -Midodrine and albumin support as needed  Hypoglycemia (improved) Patient is on Humalog for diabetes, may be due to decreased oral intake and continuation of insulin. She was previously on Actos, which was stopped at the time of discharge on 12/18 -Sugars improved with the addition of stress dose steroids -prn D50 -DC dextrose infusion considering edema and anasarca - check sulfonylurea hypoglycemia panel (pending) -Suspect liver failure and adrenal insufficiency as cause for persistent hypoglycemia  Hypothermia: likely due to hypoglycemia -[place] Hugger  Hypertension:  -hold Bp  medsuntil mental status improves  Anxiety: -hold home medsuntil mental status improves  Atrial Fibrillation:  CHA2DS2-VASc Scoreis 5, needs oral anticoagulation.  Patient is on Coumadin at home.  INR is2.1on admission.  Heart rate is 80-111. -Coumadin per pharm -hold oralmetoprolol until mental status improves -As needed IV metoprolol if heart rate is above 125 -  inr therapeutic -Pharmacy following for anticoagulation monitoring, recs appreciated  ESRD on dialysis [facility]):  pt has anasarca -renal was consulted for HD, had HD yesterday, possible plan for HD today Type II diabetes mellitus with renal manifestations (ESRD): Last A1c6.1 on 12/30/18, well controled. Patient is takingHumalogat home. -hold insulindue to hypoglycemia   DVT prophylaxis: Coumadin Code Status: DNR Family Communication: None at bedside Disposition Plan: Skilled nursing facility when stable. Family meeting with palliative care today.       LOS: 3 days   Time spent: 5 minutes with more than 50% on [place], MD [facility] Pager [phone]  If 7PM-7AM, please contact night-coverage [email or web address] Password [email or web address] 01/06/2019, 1:40 PM

## 2019-01-06 NOTE — Progress Notes (Signed)
Palliative: Brief meeting with Danielle Rowland this morning,  She is subdued, and will only briefly make eye contact.  She is alert and oriented, able to make her needs known.  There is no family at bedside at this time.  She is very swollen with 3rd spacing.   I ask how she did with HD yesterday, and she tells me that she doesn't remember, she slept through it.  As we are talking, attending arrives, who shares that she may have another HD session today.  I share with Danielle Rowland, that it's her choice to have HD, but she quickly states that she will take another HD session.  I ask if she has thought about where to go when she leaves the hospital, would she go back to [facility] or back to her home with family .  She tells me that she has not decided.  I share that family will be her this afternoon and we will discuss some choices.    Family meeting with sister Danielle Rowland and friend Danielle Rowland at bedside.  Unfortunately, Danielle Rowland was taken to hemodialysis around 1 PM.  She is not available to have scheduled goals of care discussion.  Sister Danielle Rowland and I talked about Danielle Rowland's acute and chronic health concerns.  Both Danielle Rowland and Danielle Rowland are adamant that Danielle Rowland does not understand the severity of her illness, and would not not want to continue to live if she did not have independence.  We talked about people's idea of what is important in them changing as they are faced with mortality.   It seems that Danielle Rowland has stabilized with her blood sugars, but she continues quite frail and ill.  Danielle Rowland and Danielle Rowland shared that they will continue goals of care discussions with Danielle Rowland when she returns from hemodialysis.  We talked about some "what if's and maybe's".  PMT to continue to follow.  Plan: At this point Danielle Rowland wishes to continue with hemodialysis, continue to treat the treatable.  PMT to continue goals of care discussion.  65 minutes, extended time [name], NP Palliative Medicine Team Team  Phone # [phone] Greater than 50% of this time was spent counseling and coordinating care related to the above assessment and plan.

## 2019-01-07 ENCOUNTER — Telehealth: Payer: Self-pay | Admitting: Family Medicine

## 2019-01-07 DIAGNOSIS — Z66 Do not resuscitate: Secondary | ICD-10-CM

## 2019-01-07 DIAGNOSIS — Z7189 Other specified counseling: Secondary | ICD-10-CM

## 2019-01-07 LAB — GLUCOSE, CAPILLARY
Glucose-Capillary: 104 mg/dL — ABNORMAL HIGH (ref 70–99)
Glucose-Capillary: 122 mg/dL — ABNORMAL HIGH (ref 70–99)
Glucose-Capillary: 94 mg/dL (ref 70–99)

## 2019-01-07 MED ORDER — MORPHINE SULFATE (PF) 2 MG/ML IV SOLN
2.0000 mg | Freq: Once | INTRAVENOUS | Status: AC
  Administered 2019-01-07: 2 mg via INTRAVENOUS
  Filled 2019-01-07: qty 1

## 2019-01-07 MED ORDER — ATROPINE SULFATE 1 % OP SOLN: 2.0000 [drp] | Freq: Four times a day (QID) | OPHTHALMIC | 0 refills | Status: AC | PRN

## 2019-01-07 MED ORDER — SCOPOLAMINE 1 MG/3DAYS TD PT72: 1.0000 | MEDICATED_PATCH | TRANSDERMAL | 0 refills | Status: AC

## 2019-01-07 MED ORDER — ACETAMINOPHEN 325 MG PO TABS: 650.0000 mg | ORAL_TABLET | Freq: Four times a day (QID) | ORAL | Status: AC | PRN

## 2019-01-07 MED ORDER — ONDANSETRON 4 MG PO TBDP: 4.0000 mg | ORAL_TABLET | Freq: Three times a day (TID) | ORAL | Status: DC | PRN

## 2019-01-07 MED ORDER — HYDROMORPHONE HCL 2 MG PO TABS
1.0000 mg | ORAL_TABLET | ORAL | Status: DC | PRN
  Administered 2019-01-07 (×2): 2 mg via ORAL
  Filled 2019-01-07 (×2): qty 1

## 2019-01-07 MED ORDER — SCOPOLAMINE 1 MG/3DAYS TD PT72
1.0000 | MEDICATED_PATCH | TRANSDERMAL | Status: DC
  Filled 2019-01-07: qty 1

## 2019-01-07 MED ORDER — HYDROMORPHONE HCL 2 MG PO TABS: 1.0000 mg | ORAL_TABLET | ORAL | Status: DC | PRN

## 2019-01-07 MED ORDER — HYDROMORPHONE HCL 2 MG PO TABS: 1.0000 mg | ORAL_TABLET | ORAL | 0 refills | Status: AC | PRN

## 2019-01-07 MED ORDER — ATROPINE SULFATE 1 % OP SOLN
2.0000 [drp] | Freq: Four times a day (QID) | OPHTHALMIC | Status: DC | PRN
  Filled 2019-01-07: qty 2

## 2019-01-07 MED ORDER — ACETAMINOPHEN 650 MG RE SUPP: 650.0000 mg | Freq: Four times a day (QID) | RECTAL | 0 refills | Status: AC | PRN

## 2019-01-07 MED ORDER — ALPRAZOLAM 0.25 MG PO TABS: 0.2500 mg | ORAL_TABLET | Freq: Three times a day (TID) | ORAL | Status: DC | PRN

## 2019-01-07 NOTE — Progress Notes (Signed)
Pt cleared to discharge home with Hospice. Per Doreatha Lew, EMS will be here to pick up around 9pm. Night RN made aware. AVS printed and in packet.

## 2019-01-07 NOTE — Progress Notes (Signed)
Patient was pending palliative care consult at [facility] who was re-admitted to [facility] on 12.20.20 prior to admission to services.  Family meeting and palliative care consult was scheduled today for 1400. This RN was asked to be present by the patient, family and [name] NP.   Unfortunately, the staff sent Ms.Hartsfield to dialysis, unaware that a palliative care consult was scheduled to discuss goals of care with Ms. Guion and her family.  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  Family wanting discusion to be had today- as the family is sure the patient will not want to do further dialysis and will want to go home to be with her family and friends in the time she has left.  Rescheduled appointment for 1730 this evening.  This RN will meet with the family and the patient at this time.  Patient's sister, Gretchen Portela, Leanor Rubenstein, Granddaughter and Wallace Cullens were present and in agreement with meeting.  Dimas Aguas, RN Clinical Nurse Liaison [place] [phone]

## 2019-01-07 NOTE — Progress Notes (Signed)
Palliative: [name] is resting quietly in bed.  She makes and somewhat keeps eye contact.  She is alert and oriented, able to make her needs known.  There is no family at bedside.   [name] tells me that she has decided to stop hemodialysis. She tells me that she wants to go home with hospice care.  She tells me that she would like to leave today.  We talk about working with her family for care. I reassure [name] that we will take care of her.    No further lab draws, sticks, or medications that aren't focused on comfort. We talk briefly about what is and is not provided with hospice.    Conference with bedside nursing staff, hospice representative, and attended.   Plan:   Full comfort care, home with hospice care.  Prognosis:  2 weeks or less anticipated  40 minutes Danielle Axe, NP Palliative Medicine Team Team Phone # [phone] Greater than 50% of this time was spent counseling and coordinating care related to the above assessment and plan.

## 2019-01-07 NOTE — Discharge Summary (Signed)
Danielle Rowland WCH:[id number] DOB: [birth year] DOA: 01/03/2019  PCP: Birdie Sons, MD  Admit date: 01/03/2019 Discharge date: 01/07/2019  Admitted From: home Disposition:  Home with hospice  Recommendations for Outpatient Follow-up:  1. Symptom management per hospice protocal  Home Health:none. hospice    Discharge Condition:Guarded CODE STATUS:DRN  Diet recommendation: pleasure food as requested as tolerated Brief/Interim Summary: Danielle Yager Andersonis a 78 y.o.femalewith medical history significant ofESRD-HD,hypertension, diabetes mellitus, rectal cancer (resection:), CLL, splenectomy, liver cirrhosis, who presents with unresponsiveness.  Patient has AMS,and isunable to provide medical history, therefore, most of the history is obtained by discussing the case with ED physician, per EMS report, and with the nursing staff.  [facility] was found to be unresponsive this morning. Pt is anew [facility] at [facility] only there proximately 24 hours per report.EMS reports blood sugar 68and pt wasoccasional episodes of apnea.Her blood sugar was 23 initially and repeated test showed that her blood sugar was 10 in ED. Pt was give D50 and D5 gtt, her blood sugar improved to 200 and then decreased to 90s again. When I saw pt in ED, she is minimally responsive. Shemoves all extremities upon painful stimuli. No active cough, nausea, vomiting, diarrhea noted. Not sure if patient has any chest pain or abdominal pain. Patient has 3+ leg edema. Nephrology was consulted and patient was started on hemodialysis.  For septic shock secondary to UTI and was started on IV antibiotics aztreonam.  Also had received vancomycin.  Urine culture with Enterococcus faecalis.  Blood cultures pending.  She was in the ICU on 1221 for evaluation for pressor support and was weaned off as tolerated and started on stress dose steroids.  She had a CT of the head that was  negative for unresponsiveness and it was likely her unresponsiveness was due to hypoglycemia.  She was treated with bear hugger's for hypothermia.  Family and the patient per discussion with palliative care and they had decided to move forward with home hospice.  Patient will be discharged home with home hospice and symptom management per hospice protocol.  Discharge Diagnoses:  Principal Problem:   Unresponsiveness Active Problems:   Hypertension   Anxiety   Septic shock (HCC)   Atrial fibrillation (HCC)   Hypoglycemia   Hypothermia   ESRD on dialysis ([place])   DNR (do not resuscitate)   Type II diabetes mellitus with renal manifestations ([place])   Altered mental status   Goals of care, counseling/discussion   Palliative care by specialist   Encounter for hospice care discussion    Discharge Instructions  Discharge Instructions    Diet - low sodium heart healthy   Complete by: As directed    Increase activity slowly   Complete by: As directed      Allergies as of 01/07/2019      Reactions   Influenza Vaccines    Iodinated Diagnostic Agents    IVP Dye   Metformin Hcl Nausea Only, Nausea And Vomiting   Penicillins    Sulfa Antibiotics       Medication List    STOP taking these medications   calcium carbonate 1250 (500 Ca) MG tablet Commonly known as: OS-CAL - dosed in mg of elemental calcium   Glucagon HCl 1 MG Solr   insulin lispro 100 UNIT/ML injection Commonly known as: HUMALOG   metoprolol tartrate 25 MG tablet Commonly known as: LOPRESSOR   Vitamin D3 125 MCG (5000 UT) Tabs   warfarin  1 MG tablet Commonly known as: COUMADIN     TAKE these medications   acetaminophen 325 MG tablet Commonly known as: TYLENOL Take 2 tablets (650 mg total) by mouth every 6 (six) hours as needed for mild pain (or Fever >/= 101).   acetaminophen 650 MG suppository Commonly known as: TYLENOL Place 1 suppository (650 mg total) rectally every 6 (six) hours as needed for mild  pain (or Fever >/= 101).   ALPRAZolam 0.25 MG tablet Commonly known as: XANAX Take 1 tablet (0.25 mg total) by mouth 3 (three) times daily as needed for anxiety.   atropine 1 % ophthalmic solution Place 2 drops under the tongue 4 (four) times daily as needed (increased oral secretions).   HYDROmorphone 2 MG tablet Commonly known as: DILAUDID Take 0.5-1 tablets (1-2 mg total) by mouth every 4 (four) hours as needed for moderate pain or severe pain (increased WOB, SOB).   promethazine 25 MG tablet Commonly known as: PHENERGAN TAKE 1 TABLET (25 MG TOTAL) BY MOUTH EVERY 6 (SIX) HOURS AS NEEDED. NAUSEA   scopolamine 1 MG/3DAYS Commonly known as: TRANSDERM-SCOP Place 1 patch (1.5 mg total) onto the skin every 3 (three) days.       Allergies  Allergen Reactions  . Influenza Vaccines   . Iodinated Diagnostic Agents     IVP Dye  . Metformin Hcl Nausea Only and Nausea And Vomiting  . Penicillins   . Sulfa Antibiotics     Consultations:  Nephrology palliative care   Procedures/Studies: CT Head Wo Contrast  Result Date: 01/03/2019 CLINICAL DATA:  Unresponsive, altered mental status. EXAM: CT HEAD WITHOUT CONTRAST TECHNIQUE: Contiguous axial images were obtained from the base of the skull through the vertex without intravenous contrast. COMPARISON:  None. FINDINGS: Brain: No evidence of acute infarction, hemorrhage, hydrocephalus, extra-axial collection or mass lesion/mass effect. Signs of atrophy and chronic microvascular ischemic change. Vascular: No hyperdense vessel or unexpected calcification. Skull: Normal. Negative for fracture or focal lesion. Sinuses/Orbits: Visualized sinuses and orbits are unremarkable. Other: None IMPRESSION: 1. No acute intracranial abnormality. 2. Signs of atrophy and chronic microvascular ischemic change. Electronically Signed   By: Zetta Bills M.D.   On: 01/03/2019 09:12   US Renal  Result Date: 12/23/2018 CLINICAL DATA:  Renal failure EXAM: RENAL  / URINARY TRACT ULTRASOUND COMPLETE COMPARISON:  CT from 03/23/2013 FINDINGS: Right Kidney: Renal measurements: 11.1 x 6.4 x 4.8 cm. = volume: 176 mL. Diffuse increased echogenicity is noted. Mild cortical thinning is noted. No obstructive changes are noted. Left Kidney: Renal measurements: 4 x 5.4 x 4.7 cm. = volume: 125 mL. Severe hydronephrosis and renal pelvic dilatation is noted similar to that seen on the prior exam. Severe cortical thinning is noted stable from the prior CT examination. Bladder: The bladder is decompressed. Other: None. IMPRESSION: Significant cortical thinning within the left kidney related to a severe degree of hydronephrosis which is longstanding and stable from the prior CT examination. Increased echogenicity within the right kidney consistent with medical renal disease. Electronically Signed   By: Inez Catalina M.D.   On: 12/23/2018 23:53   PERIPHERAL VASCULAR CATHETERIZATION  Result Date: 12/28/2018 See op note  US Venous Img Upper Uni Left (DVT)  Result Date: 12/31/2018 CLINICAL DATA:  History of left-sided dialysis catheter insertion on 12/28/2018 now with pain and edema. Evaluate for DVT. EXAM: LEFT UPPER EXTREMITY VENOUS DOPPLER ULTRASOUND TECHNIQUE: Gray-scale sonography with graded compression, as well as color Doppler and duplex ultrasound were performed to evaluate the  upper extremity deep venous system from the level of the subclavian vein and including the jugular, axillary, basilic, radial, ulnar and upper cephalic vein. Spectral Doppler was utilized to evaluate flow at rest and with distal augmentation maneuvers. COMPARISON:  None. FINDINGS: Contralateral Subclavian Vein: Respiratory phasicity is normal and symmetric with the symptomatic side. No evidence of thrombus. Normal compressibility. Internal Jugular Vein: No evidence of thrombus. Normal compressibility, respiratory phasicity and response to augmentation. Subclavian Vein: No evidence of thrombus. Normal  compressibility, respiratory phasicity and response to augmentation. Axillary Vein: No evidence of thrombus. Normal compressibility, respiratory phasicity and response to augmentation. Cephalic Vein: No evidence of thrombus. Normal compressibility, respiratory phasicity and response to augmentation. Basilic Vein: No evidence of thrombus. Normal compressibility, respiratory phasicity and response to augmentation. Brachial Veins: No evidence of thrombus within either of the paired brachial veins. Normal compressibility, respiratory phasicity and response to augmentation. Radial Veins: No evidence of thrombus. Normal compressibility, respiratory phasicity and response to augmentation. Ulnar Veins: No evidence of thrombus. Normal compressibility, respiratory phasicity and response to augmentation. Venous Reflux:  None visualized. Other Findings:  None visualized. IMPRESSION: No evidence of DVT within the left upper extremity. Electronically Signed   By: Sandi Mariscal M.D.   On: 12/31/2018 16:39   DG Chest Port 1 View  Result Date: 01/03/2019 CLINICAL DATA:  Shortness of breath EXAM: PORTABLE CHEST 1 VIEW COMPARISON:  12/23/2018 FINDINGS: Chronic cardiomegaly. Atherosclerotic calcification. Mediastinal contours are distorted by leftward rotation. Dialysis catheter from the left with tip at the right atrium. Right-sided port in stable position. Diffuse interstitial coarsening, likely congestive. Nonvisualized left diaphragm where there could be pulmonary opacity or pleural effusion. No pneumothorax. IMPRESSION: Symmetric interstitial coarsening, likely congestive. There is opacity at the left base with nonvisualized diaphragm, suspect pleural effusion. Electronically Signed   By: Monte Fantasia M.D.   On: 01/03/2019 08:18   DG Chest Portable 1 View  Result Date: 12/23/2018 CLINICAL DATA:  Short of breath EXAM: PORTABLE CHEST 1 VIEW COMPARISON:  09/23/2005 FINDINGS: Cardiac enlargement. Negative for edema or  effusion. Negative for pneumonia. Atherosclerotic aortic arch. Port-A-Cath tip in the SVC. IMPRESSION: No active disease. Electronically Signed   By: Franchot Gallo M.D.   On: 12/23/2018 20:30   CT RENAL STONE STUDY  Result Date: 12/24/2018 CLINICAL DATA:  78 year old female with history of flank pain. Suspected nephrolithiasis. EXAM: CT ABDOMEN AND PELVIS WITHOUT CONTRAST TECHNIQUE: Multidetector CT imaging of the abdomen and pelvis was performed following the standard protocol without IV contrast. COMPARISON:  CT the abdomen and pelvis 03/23/2013. FINDINGS: Lower chest: Cardiomegaly. Calcifications of the inferior mitral annulus. Atherosclerotic calcifications in the descending thoracic aorta as well as the left circumflex and right coronary arteries. Central venous catheter tip in the right atrium. Hepatobiliary: Liver has a shrunken appearance and nodular contour, indicative of underlying cirrhosis. No discrete cystic or solid hepatic lesions are confidently identified on today's noncontrast CT examination. Status post cholecystectomy. Pancreas: No definite pancreatic mass or peripancreatic fluid collections or inflammatory changes noted on today's noncontrast CT examination. Spleen: Status post splenectomy. Adrenals/Urinary Tract: Severe chronic left-sided hydroureteronephrosis, similar to the prior study. On today's examination (axial image 57 of series 2) there is a 6 mm calculus at the junction of middle and distal thirds of the left ureter. Severe atrophy of the left renal parenchyma. Unenhanced appearance of the right kidney is unremarkable. No right hydroureteronephrosis. Urinary bladder is nearly decompressed, but otherwise unremarkable in appearance. Stomach/Bowel: Normal appearance of the stomach. No  pathologic dilatation of small bowel or colon. Suture line at the rectosigmoid junction from prior partial colorectal resection. Large ventral hernia containing multiple loops of small bowel, similar  to the prior examination. Appendix is not confidently identified. There are areas of mural thickening in the cecum and ascending colon (axial image 65 and axial image 53 of series 2 respectively), which could reflect regions of colitis. Vascular/Lymphatic: Aortic atherosclerosis. No lymphadenopathy is noted in the abdomen or pelvis. Reproductive: Uterus and ovaries are a trophic. Other: No significant volume of ascites.  No pneumoperitoneum. Musculoskeletal: There are no aggressive appearing lytic or blastic lesions noted in the visualized portions of the skeleton. IMPRESSION: 1. 6 mm calculus at the junction of middle and distal thirds of the left ureter is new compared to the prior study, with severe proximal chronic left-sided hydroureteronephrosis and severe atrophy of the left kidney which otherwise appears similar to the prior examination. 2. Focal areas of mural thickening involving the cecum and ascending colon, concerning for potential regions of colitis. 3. Cirrhosis. 4. Status post cholecystectomy. 5. Aortic atherosclerosis, in addition to least 2 vessel coronary artery disease. Assessment for potential risk factor modification, dietary therapy or pharmacologic therapy may be warranted, if clinically indicated. 6. Additional incidental findings, as above. Electronically Signed   By: Vinnie Langton M.D.   On: 12/24/2018 10:48       Subjective: No complaints  Discharge Exam: Vitals:   01/07/19 0513 01/07/19 0628  BP: 128/79   Pulse: (!) 38 95  Resp: 18   Temp: (!) 97.4 F (36.3 C)   SpO2: 93% 91%   Vitals:   01/06/19 1933 01/07/19 0500 01/07/19 0513 01/07/19 0628  BP: 133/82  128/79   Pulse: (!) 114  (!) 38 95  Resp: 18  18   Temp: 98 F (36.7 C)  (!) 97.4 F (36.3 C)   TempSrc: Oral  Oral   SpO2: 100%  93% 91%  Weight:  98.3 kg    Height:        General exam: Appears calm and comfortable, NAD Respiratory system: Clear to auscultation.  Poor respiratory effort  normal. Cardiovascular system: S1 & S2 heard, RRR.  No murmurs, rubs, gallops or clicks.  Gastrointestinal system:  Soft, distended, fluid-filled  Central nervous system:  Awake and alert, no focal deficits noted Extremities: Edema UE and LE x4, small areas of weeping on b/l ue Skin: warm    The results of significant diagnostics from this hospitalization (including imaging, microbiology, ancillary and laboratory) are listed below for reference.     Microbiology: Recent Results (from the past 240 hour(s))  SARS CORONAVIRUS 2 (TAT 6-24 HRS) Nasopharyngeal Nasopharyngeal Swab     Status: None   Collection Time: 12/31/18  2:31 PM   Specimen: Nasopharyngeal Swab  Result Value Ref Range Status   SARS Coronavirus 2 NEGATIVE NEGATIVE Final    Comment: (NOTE) SARS-CoV-2 target nucleic acids are NOT DETECTED. The SARS-CoV-2 RNA is generally detectable in upper and lower respiratory specimens during the acute phase of infection. Negative results do not preclude SARS-CoV-2 infection, do not rule out co-infections with other pathogens, and should not be used as the sole basis for treatment or other patient management decisions. Negative results must be combined with clinical observations, patient history, and epidemiological information. The expected result is Negative. Fact Sheet for Patients: SugarRoll.be Fact Sheet for Healthcare Providers: https://www.woods-mathews.com/ This test is not yet approved or cleared by the [place] and  has been authorized  for detection and/or diagnosis of SARS-CoV-2 by FDA under an Emergency Use Authorization (EUA). This EUA will remain  in effect (meaning this test can be used) for the duration of the COVID-19 declaration under Section 56 4(b)(1) of the Act, 21 U.S.C. section 360bbb-3(b)(1), unless the authorization is terminated or revoked sooner. Performed at [facility], [place] [address],  [place], [place] [address]   Blood Culture (routine x 2)     Status: None (Preliminary result)   Collection Time: 01/03/19  7:19 AM   Specimen: BLOOD  Result Value Ref Range Status   Specimen Description BLOOD LAC  Final   Special Requests   Final    BOTTLES DRAWN AEROBIC AND ANAEROBIC Blood Culture adequate volume   Culture   Final    NO GROWTH 4 DAYS Performed at [facility], [address] [place], [place] [address]    Report Status PENDING  Incomplete  Blood Culture (routine x 2)     Status: None (Preliminary result)   Collection Time: 01/03/19  7:19 AM   Specimen: BLOOD  Result Value Ref Range Status   Specimen Description BLOOD LH  Final   Special Requests   Final    BOTTLES DRAWN AEROBIC AND ANAEROBIC Blood Culture adequate volume   Culture   Final    NO GROWTH 4 DAYS Performed at [facility], [address], [place], [place] [address]    Report Status PENDING  Incomplete  Respiratory Panel by RT PCR (Flu A&B, Covid) - Nasopharyngeal Swab     Status: None   Collection Time: 01/03/19  7:19 AM   Specimen: Nasopharyngeal Swab  Result Value Ref Range Status   SARS Coronavirus 2 by RT PCR NEGATIVE NEGATIVE Final    Comment: (NOTE) SARS-CoV-2 target nucleic acids are NOT DETECTED. The SARS-CoV-2 RNA is generally detectable in upper respiratoy specimens during the acute phase of infection. The lowest concentration of SARS-CoV-2 viral copies this assay can detect is 131 copies/mL. A negative result does not preclude SARS-Cov-2 infection and should not be used as the sole basis for treatment or other patient management decisions. A negative result may occur with  improper specimen collection/handling, submission of specimen other than nasopharyngeal swab, presence of viral mutation(s) within the areas targeted by this assay, and inadequate number of viral copies (<131 copies/mL). A negative result must be combined with clinical observations, patient history,  and epidemiological information. The expected result is Negative. Fact Sheet for Patients:  PinkCheek.be Fact Sheet for Healthcare Providers:  GravelBags.it This test is not yet ap proved or cleared by the [place] FDA and  has been authorized for detection and/or diagnosis of SARS-CoV-2 by FDA under an Emergency Use Authorization (EUA). This EUA will remain  in effect (meaning this test can be used) for the duration of the COVID-19 declaration under Section 564(b)(1) of the Act, 21 U.S.C. section 360bbb-3(b)(1), unless the authorization is terminated or revoked sooner.    Influenza A by PCR NEGATIVE NEGATIVE Final   Influenza B by PCR NEGATIVE NEGATIVE Final    Comment: (NOTE) The Xpert Xpress SARS-CoV-2/FLU/RSV assay is intended as an aid in  the diagnosis of influenza from Nasopharyngeal swab specimens and  should not be used as a sole basis for treatment. Nasal washings and  aspirates are unacceptable for Xpert Xpress SARS-CoV-2/FLU/RSV  testing. Fact Sheet for Patients: PinkCheek.be Fact Sheet for Healthcare Providers: GravelBags.it This test is not yet approved or cleared by the [place] and  has been authorized for detection and/or diagnosis of SARS-CoV-2 by  FDA under an Emergency Use Authorization (EUA). This EUA will remain  in effect (meaning this test can be used) for the duration of the  Covid-19 declaration under Section 564(b)(1) of the Act, 21  U.S.C. section 360bbb-3(b)(1), unless the authorization is  terminated or revoked. Performed at [facility], [address] [place], [place] [address]   Urine culture     Status: None   Collection Time: 01/03/19  7:40 AM   Specimen: In/Out Cath Urine  Result Value Ref Range Status   Specimen Description   Final    IN/OUT CATH URINE Performed at [facility], [address] [place], [place] [address]    Special Requests   Final    NONE Performed at [facility], [address], [place], [place] [address]    Culture   Final    NO GROWTH Performed at [facility], [place] [address], [place], [place] [address]    Report Status 01/04/2019 FINAL  Final  MRSA PCR Screening     Status: None   Collection Time: 01/04/19  1:42 AM   Specimen: Nasal Mucosa; Nasopharyngeal  Result Value Ref Range Status   MRSA by PCR NEGATIVE NEGATIVE Final    Comment:        The GeneXpert MRSA Assay (FDA approved for NASAL specimens only), is one component of a comprehensive MRSA colonization surveillance program. It is not intended to diagnose MRSA infection nor to guide or monitor treatment for MRSA infections. Performed at [facility], [place] [place], [place] [address]      Labs: BNP (last 3 results) Recent Labs    01/03/19 0719  BNP 4,166.0*   Basic Metabolic Panel: Recent Labs  Lab 01/01/19 0827 01/03/19 0719 01/04/19 0409 01/05/19 1331 01/06/19 1220  NA 136 134* 134* 134* 134*  K 4.5 5.0 3.9 4.1 4.7  CL 95* 95* 98 94* 92*  CO2 22 25 25 30 30   GLUCOSE 121* 23* 110* 91 158*  BUN 50* 58* 38* 32* 38*  CREATININE 6.06* 6.10* 4.40* 3.94* 4.72*  CALCIUM 7.4* 7.2* 6.6* 6.8* 7.6*  PHOS 5.4*  --   --  4.2 5.2*   Liver Function Tests: Recent Labs  Lab 01/01/19 0827 01/03/19 0719 01/05/19 1331 01/06/19 1220  AST  --  19 13*  --   ALT  --  29 19  --   ALKPHOS  --  226* 137*  --   BILITOT  --  1.3* 1.2  --   PROT  --  6.0* 5.6*  --   ALBUMIN 2.2* 2.2* 2.6*  2.6* 2.9*   No results for input(s): LIPASE, AMYLASE in the last 168 hours. Recent Labs  Lab 01/04/19 1509  AMMONIA 26   CBC: Recent Labs  Lab 01/03/19 0719 01/04/19 0409 01/05/19 1331 01/06/19 1220  WBC 18.0* 16.4* 11.4* 13.3*  NEUTROABS 16.0*  --  10.6*  --   HGB 14.1 11.6* 12.5 12.4  HCT 42.0 36.3 38.2 38.1  MCV 90.9 94.5 94.3 95.5  PLT 193  166 165 175   Cardiac Enzymes: No results for input(s): CKTOTAL, CKMB, CKMBINDEX, TROPONINI in the last 168 hours. BNP: Invalid input(s): POCBNP CBG: Recent Labs  Lab 01/06/19 0746 01/06/19 1650 01/07/19 0007 01/07/19 0431 01/07/19 0739  GLUCAP 94 138* 122* 94 104*   D-Dimer No results for input(s): DDIMER in the last 72 hours. Hgb A1c No  results for input(s): HGBA1C in the last 72 hours. Lipid Profile No results for input(s): CHOL, HDL, LDLCALC, TRIG, CHOLHDL, LDLDIRECT in the last 72 hours. Thyroid function studies No results for input(s): TSH, T4TOTAL, T3FREE, THYROIDAB in the last 72 hours.  Invalid input(s): FREET3 Anemia work up No results for input(s): VITAMINB12, FOLATE, FERRITIN, TIBC, IRON, RETICCTPCT in the last 72 hours. Urinalysis    Component Value Date/Time   COLORURINE BROWN (A) 01/03/2019 0748   APPEARANCEUR CLOUDY (A) 01/03/2019 0748   LABSPEC 1.023 01/03/2019 0748   PHURINE  01/03/2019 0748    TEST NOT REPORTED DUE TO COLOR INTERFERENCE OF URINE PIGMENT   GLUCOSEU (A) 01/03/2019 0748    TEST NOT REPORTED DUE TO COLOR INTERFERENCE OF URINE PIGMENT   HGBUR (A) 01/03/2019 0748    TEST NOT REPORTED DUE TO COLOR INTERFERENCE OF URINE PIGMENT   BILIRUBINUR (A) 01/03/2019 0748    TEST NOT REPORTED DUE TO COLOR INTERFERENCE OF URINE PIGMENT   KETONESUR (A) 01/03/2019 0748    TEST NOT REPORTED DUE TO COLOR INTERFERENCE OF URINE PIGMENT   PROTEINUR (A) 01/03/2019 0748    TEST NOT REPORTED DUE TO COLOR INTERFERENCE OF URINE PIGMENT   NITRITE (A) 01/03/2019 0748    TEST NOT REPORTED DUE TO COLOR INTERFERENCE OF URINE PIGMENT   LEUKOCYTESUR (A) 01/03/2019 0748    TEST NOT REPORTED DUE TO COLOR INTERFERENCE OF URINE PIGMENT   Sepsis Labs Invalid input(s): PROCALCITONIN,  WBC,  LACTICIDVEN Microbiology Recent Results (from the past 240 hour(s))  SARS CORONAVIRUS 2 (TAT 6-24 HRS) Nasopharyngeal Nasopharyngeal Swab     Status: None   Collection Time: 12/31/18   2:31 PM   Specimen: Nasopharyngeal Swab  Result Value Ref Range Status   SARS Coronavirus 2 NEGATIVE NEGATIVE Final    Comment: (NOTE) SARS-CoV-2 target nucleic acids are NOT DETECTED. The SARS-CoV-2 RNA is generally detectable in upper and lower respiratory specimens during the acute phase of infection. Negative results do not preclude SARS-CoV-2 infection, do not rule out co-infections with other pathogens, and should not be used as the sole basis for treatment or other patient management decisions. Negative results must be combined with clinical observations, patient history, and epidemiological information. The expected result is Negative. Fact Sheet for Patients: SugarRoll.be Fact Sheet for Healthcare Providers: https://www.woods-mathews.com/ This test is not yet approved or cleared by the [place] FDA and  has been authorized for detection and/or diagnosis of SARS-CoV-2 by FDA under an Emergency Use Authorization (EUA). This EUA will remain  in effect (meaning this test can be used) for the duration of the COVID-19 declaration under Section 56 4(b)(1) of the Act, 21 U.S.C. section 360bbb-3(b)(1), unless the authorization is terminated or revoked sooner. Performed at [facility], [place] [address] [place], [place] [address]   Blood Culture (routine x 2)     Status: None (Preliminary result)   Collection Time: 01/03/19  7:19 AM   Specimen: BLOOD  Result Value Ref Range Status   Specimen Description BLOOD LAC  Final   Special Requests   Final    BOTTLES DRAWN AEROBIC AND ANAEROBIC Blood Culture adequate volume   Culture   Final    NO GROWTH 4 DAYS Performed at [facility], [address], [place], [place] [address]    Report Status PENDING  Incomplete  Blood Culture (routine x 2)     Status: None (Preliminary result)   Collection Time: 01/03/19  7:19 AM   Specimen: BLOOD  Result Value Ref Range Status    Specimen Description BLOOD LH  Final   Special Requests   Final    BOTTLES DRAWN AEROBIC AND ANAEROBIC Blood Culture adequate volume   Culture   Final    NO GROWTH 4 DAYS Performed at [facility], [address] [place], [place] [address]    Report Status PENDING  Incomplete  Respiratory Panel by RT PCR (Flu A&B, Covid) - Nasopharyngeal Swab     Status: None   Collection Time: 01/03/19  7:19 AM   Specimen: Nasopharyngeal Swab  Result Value Ref Range Status   SARS Coronavirus 2 by RT PCR NEGATIVE NEGATIVE Final    Comment: (NOTE) SARS-CoV-2 target nucleic acids are NOT DETECTED. The SARS-CoV-2 RNA is generally detectable in upper respiratoy specimens during the acute phase of infection. The lowest concentration of SARS-CoV-2 viral copies this assay can detect is 131 copies/mL. A negative result does not preclude SARS-Cov-2 infection and should not be used as the sole basis for treatment or other patient management decisions. A negative result may occur with  improper specimen collection/handling, submission of specimen other than nasopharyngeal swab, presence of viral mutation(s) within the areas targeted by this assay, and inadequate number of viral copies (<131 copies/mL). A negative result must be combined with clinical observations, patient history, and epidemiological information. The expected result is Negative. Fact Sheet for Patients:  PinkCheek.be Fact Sheet for Healthcare Providers:  GravelBags.it This test is not yet ap proved or cleared by the [place] FDA and  has been authorized for detection and/or diagnosis of SARS-CoV-2 by FDA under an Emergency Use Authorization (EUA). This EUA will remain  in effect (meaning this test can be used) for the duration of the COVID-19 declaration under Section 564(b)(1) of the Act, 21 U.S.C. section 360bbb-3(b)(1), unless the authorization is terminated  or revoked sooner.    Influenza A by PCR NEGATIVE NEGATIVE Final   Influenza B by PCR NEGATIVE NEGATIVE Final    Comment: (NOTE) The Xpert Xpress SARS-CoV-2/FLU/RSV assay is intended as an aid in  the diagnosis of influenza from Nasopharyngeal swab specimens and  should not be used as a sole basis for treatment. Nasal washings and  aspirates are unacceptable for Xpert Xpress SARS-CoV-2/FLU/RSV  testing. Fact Sheet for Patients: PinkCheek.be Fact Sheet for Healthcare Providers: GravelBags.it This test is not yet approved or cleared by the [place] FDA and  has been authorized for detection and/or diagnosis of SARS-CoV-2 by  FDA under an Emergency Use Authorization (EUA). This EUA will remain  in effect (meaning this test can be used) for the duration of the  Covid-19 declaration under Section 564(b)(1) of the Act, 21  U.S.C. section 360bbb-3(b)(1), unless the authorization is  terminated or revoked. Performed at [facility] [address] [place], [place] [address]   Urine culture     Status: None   Collection Time: 01/03/19  7:40 AM   Specimen: In/Out Cath Urine  Result Value Ref Range Status   Specimen Description   Final    IN/OUT CATH URINE Performed at [facility], [address], [place], [place] [address]    Special Requests   Final    NONE Performed at [facility], [address], [place], [place] [address]    Culture   Final    NO GROWTH Performed at [facility], [place] [address] [place], [place] [address]    Report Status 01/04/2019 FINAL  Final  MRSA  PCR Screening     Status: None   Collection Time: 01/04/19  1:42 AM   Specimen: Nasal Mucosa; Nasopharyngeal  Result Value Ref Range Status   MRSA by PCR NEGATIVE NEGATIVE Final    Comment:        The GeneXpert MRSA Assay (FDA approved for NASAL specimens only), is one component of a comprehensive MRSA  colonization surveillance program. It is not intended to diagnose MRSA infection nor to guide or monitor treatment for MRSA infections. Performed at [facility], [address], [place], [place] [address]      Time coordinating discharge: Over 30 minutes  SIGNED:   Nolberto Hanlon, MD  [facility] 01/07/2019, 1:34 PM Pager   If 7PM-7AM, please contact night-coverage [email or web address] Password [email or web address]

## 2019-01-07 NOTE — Progress Notes (Signed)
EMS transport arranged for 2100 pick up tonight. Please call with any questions or concerns.  [facility] [phone]

## 2019-01-07 NOTE — Progress Notes (Signed)
Follow up visit to have goals of care discussion with [name], her sister Danielle Rowland (Present) and her Granddaughter, Danielle Rowland (zoom) and her husband Danielle Rowland (zoom).  Meeting was delayed due to Danielle Rowland being nauseated after dialysis and is also experiencing pain in the epigastric region- rating 8/10.  RN notified and received orders.  After medications, Danielle Rowland stated she was able to participate in the meeting.  We discussed her current medical status and recent events with this hospitalization and the one from last week.  She endorses feeling as if she will not fully recover to her independent state prior to her hospitalization.  After discussion of her current situation and the different pathways available to her:  Stop dialysis, and aggressive treatment and discharge home with hospice.  Stop dialysis and focus on comfort and discharge to the hospice home.  Continue aggressive approach with dialysis and any measures needed to keep her sugars regulated, treat infections, treat her liver cirrhosis, etc.  Understanding the seriousness of her illness- she immediately said she wanted to stop dialysis and go home.  The family was in agreement and supports this decision.  The family would like the transfer home to happen on Saturday due to the Danielle Rowland and needing time to prepare 24/7 caregivers and to get her apartment ready for DME.  DME needs:  [place] bed, oxygen, over bed table, geriatric wheelchair.    Discussion was had during the meeting about the patient's son Danielle Rowland, whom has been estranged from her for several years.  She made the statement that she needed to see Danielle Rowland.  The family was supportive of her decision and will make arrangements to notify Danielle Rowland and offer him the opportunity to come and see his mother, with explanation of his mom's condition with death expected quickly after no further dialysis.  She has needed dialysis daily since this hospitalization.  The family is  aware that she may not survive until Saturday to make it home.  They were okay with this- because when she is made comfort care- she will be able to have 4 visitors.  I notified Danielle Lima, RN charge nurse of the outcomes of our meeting.  She will notify the MD of the patient's and families decision.    I will follow up tomorrow.  I will have a discussion with Danielle Axe NP on the outcome of our meeting.   Thank you for allowing participation in this patient's care.  Dimas Aguas, RN [place] [phone]

## 2019-01-07 NOTE — Telephone Encounter (Signed)
Danielle Rowland advise

## 2019-01-07 NOTE — Telephone Encounter (Signed)
Yes Hospice Care is ok.

## 2019-01-07 NOTE — Progress Notes (Signed)
Patient was currently on 2L nasal cannula but refused it during the night. Last vital sign check, patient was 91% on room air.  Will continue to monitor. Christene Slates

## 2019-01-07 NOTE — Telephone Encounter (Signed)
Danielle Rowland with [facility] is calling the pt will be discharged from hospital on 01-09-2019 and would like  hospice care at home. A verbal is ok

## 2019-01-07 NOTE — Progress Notes (Signed)
New referral for hospice at home rePhoceived from [place].  Visit to patient's room.  She is alert, oriented and talkative this morning.  She wants to know when she can go home, and what time she can go today.  She states that she does want to go home with hospice services and wants to get out of the hospital.  Call to her Granddaughter, Leanor Rubenstein.  She was able to talk to Ms. Merlos.  Alyse Low is Ms. Billiot's primary caregiver and is okay with Ms. Tinch discharging home today if it can be set up with hospice that quick.  DME needs:  Hospital bed, oxygen, over bed table, large wheelchair.  Family does request late pick up from EMS due to family not able to be at the home until 2100.  Notified Christy that Dr. Kurtis Bushman [facility] [place]- so they can be picked up prior to them closing at 1700.  Spoke with the nurse Terri to discuss discharge plans. She acknowledges plan.  Thank you for allowing participation in this patient's care.  Dimas Aguas, RN Clinical Nurse Liaison [facility] [phone]

## 2019-01-07 NOTE — Progress Notes (Signed)
[facility]  ROUNDING NOTE   Subjective:   Hemodialysis for last two days. Long conversation with patient this morning. She does not want anymore dialysis. She had a meeting with her family and palliative care and wants to go to hospice.   Objective:  Vital signs in last 24 hours:  Temp:  [97.4 F (36.3 C)-98 F (36.7 C)] 97.4 F (36.3 C) (12/24 0513) Pulse Rate:  [38-151] 95 (12/24 0628) Resp:  [11-28] 18 (12/24 0513) BP: (128-161)/(79-128) 128/79 (12/24 0513) SpO2:  [91 %-100 %] 91 % (12/24 0628) Weight:  [98.3 kg] 98.3 kg (12/24 0500)  Weight change: -9.3 kg Filed Weights   01/05/19 1607 01/06/19 0500 01/07/19 0500  Weight: 105.1 kg 107.7 kg 98.3 kg    Intake/Output: I/O last 3 completed shifts: In: 150 [IV Piggyback:150] Out: -    Intake/Output this shift:  No intake/output data recorded.  Physical Exam: General: Ill appearing  Head: Normocephalic, atraumatic.   Eyes: Anicteric, PERRL  Neck: Supple, trachea midline  Lungs:  Clear to auscultation  Heart: irregular  Abdomen:  Soft, nontender, obese  Extremities:  + weeping peripheral edema.  Neurologic: Alert and oriented  Skin: No lesions  Access: LIJ permcath    Basic Metabolic Panel: Recent Labs  Lab 01/01/19 0827 01/03/19 0719 01/04/19 0409 01/05/19 1331 01/06/19 1220  NA 136 134* 134* 134* 134*  K 4.5 5.0 3.9 4.1 4.7  CL 95* 95* 98 94* 92*  CO2 22 25 25 30 30   GLUCOSE 121* 23* 110* 91 158*  BUN 50* 58* 38* 32* 38*  CREATININE 6.06* 6.10* 4.40* 3.94* 4.72*  CALCIUM 7.4* 7.2* 6.6* 6.8* 7.6*  PHOS 5.4*  --   --  4.2 5.2*    Liver Function Tests: Recent Labs  Lab 01/01/19 0827 01/03/19 0719 01/05/19 1331 01/06/19 1220  AST  --  19 13*  --   ALT  --  29 19  --   ALKPHOS  --  226* 137*  --   BILITOT  --  1.3* 1.2  --   PROT  --  6.0* 5.6*  --   ALBUMIN 2.2* 2.2* 2.6*  2.6* 2.9*   No results for input(s): LIPASE, AMYLASE in the last 168 hours. Recent Labs  Lab  01/04/19 1509  AMMONIA 26    CBC: Recent Labs  Lab 01/03/19 0719 01/04/19 0409 01/05/19 1331 01/06/19 1220  WBC 18.0* 16.4* 11.4* 13.3*  NEUTROABS 16.0*  --  10.6*  --   HGB 14.1 11.6* 12.5 12.4  HCT 42.0 36.3 38.2 38.1  MCV 90.9 94.5 94.3 95.5  PLT 193 166 165 175    Cardiac Enzymes: No results for input(s): CKTOTAL, CKMB, CKMBINDEX, TROPONINI in the last 168 hours.  BNP: Invalid input(s): POCBNP  CBG: Recent Labs  Lab 01/06/19 0746 01/06/19 1650 01/07/19 0007 01/07/19 0431 01/07/19 0739  GLUCAP 94 138* 122* 94 104*    Microbiology: Results for orders placed or performed during the hospital encounter of 01/03/19  Blood Culture (routine x 2)     Status: None (Preliminary result)   Collection Time: 01/03/19  7:19 AM   Specimen: BLOOD  Result Value Ref Range Status   Specimen Description BLOOD LAC  Final   Special Requests   Final    BOTTLES DRAWN AEROBIC AND ANAEROBIC Blood Culture adequate volume   Culture   Final    NO GROWTH 4 DAYS Performed at [facility], [address] [place], [place] [address]  Report Status PENDING  Incomplete  Blood Culture (routine x 2)     Status: None (Preliminary result)   Collection Time: 01/03/19  7:19 AM   Specimen: BLOOD  Result Value Ref Range Status   Specimen Description BLOOD LH  Final   Special Requests   Final    BOTTLES DRAWN AEROBIC AND ANAEROBIC Blood Culture adequate volume   Culture   Final    NO GROWTH 4 DAYS Performed at [facility], [address] [place], [place] [address]    Report Status PENDING  Incomplete  Respiratory Panel by RT PCR (Flu A&B, Covid) - Nasopharyngeal Swab     Status: None   Collection Time: 01/03/19  7:19 AM   Specimen: Nasopharyngeal Swab  Result Value Ref Range Status   SARS Coronavirus 2 by RT PCR NEGATIVE NEGATIVE Final    Comment: (NOTE) SARS-CoV-2 target nucleic acids are NOT DETECTED. The SARS-CoV-2 RNA is generally detectable in upper  respiratoy specimens during the acute phase of infection. The lowest concentration of SARS-CoV-2 viral copies this assay can detect is 131 copies/mL. A negative result does not preclude SARS-Cov-2 infection and should not be used as the sole basis for treatment or other patient management decisions. A negative result may occur with  improper specimen collection/handling, submission of specimen other than nasopharyngeal swab, presence of viral mutation(s) within the areas targeted by this assay, and inadequate number of viral copies (<131 copies/mL). A negative result must be combined with clinical observations, patient history, and epidemiological information. The expected result is Negative. Fact Sheet for Patients:  PinkCheek.be Fact Sheet for Healthcare Providers:  GravelBags.it This test is not yet ap proved or cleared by the [place] FDA and  has been authorized for detection and/or diagnosis of SARS-CoV-2 by FDA under an Emergency Use Authorization (EUA). This EUA will remain  in effect (meaning this test can be used) for the duration of the COVID-19 declaration under Section 564(b)(1) of the Act, 21 U.S.C. section 360bbb-3(b)(1), unless the authorization is terminated or revoked sooner.    Influenza A by PCR NEGATIVE NEGATIVE Final   Influenza B by PCR NEGATIVE NEGATIVE Final    Comment: (NOTE) The Xpert Xpress SARS-CoV-2/FLU/RSV assay is intended as an aid in  the diagnosis of influenza from Nasopharyngeal swab specimens and  should not be used as a sole basis for treatment. Nasal washings and  aspirates are unacceptable for Xpert Xpress SARS-CoV-2/FLU/RSV  testing. Fact Sheet for Patients: PinkCheek.be Fact Sheet for Healthcare Providers: GravelBags.it This test is not yet approved or cleared by the [place] FDA and  has been authorized for  detection and/or diagnosis of SARS-CoV-2 by  FDA under an Emergency Use Authorization (EUA). This EUA will remain  in effect (meaning this test can be used) for the duration of the  Covid-19 declaration under Section 564(b)(1) of the Act, 21  U.S.C. section 360bbb-3(b)(1), unless the authorization is  terminated or revoked. Performed at [facility] [address] [place], [place] [address]   Urine culture     Status: None   Collection Time: 01/03/19  7:40 AM   Specimen: In/Out Cath Urine  Result Value Ref Range Status   Specimen Description   Final    IN/OUT CATH URINE Performed at [facility], [address], [place], [place] [address]    Special Requests   Final    NONE Performed at [facility], [address], [place], [place] [address]    Culture  Final    NO GROWTH Performed at [facility], [place] [address] [place], [place] [address]    Report Status 01/04/2019 FINAL  Final  MRSA PCR Screening     Status: None   Collection Time: 01/04/19  1:42 AM   Specimen: Nasal Mucosa; Nasopharyngeal  Result Value Ref Range Status   MRSA by PCR NEGATIVE NEGATIVE Final    Comment:        The GeneXpert MRSA Assay (FDA approved for NASAL specimens only), is one component of a comprehensive MRSA colonization surveillance program. It is not intended to diagnose MRSA infection nor to guide or monitor treatment for MRSA infections. Performed at [facility], [address] [place], [place] [address]     Coagulation Studies: Recent Labs    01/05/19 1331 01/06/19 1220  LABPROT 23.7* 25.6*  INR 2.1* 2.3*    Urinalysis: No results for input(s): COLORURINE, LABSPEC, PHURINE, GLUCOSEU, HGBUR, BILIRUBINUR, KETONESUR, PROTEINUR, UROBILINOGEN, NITRITE, LEUKOCYTESUR in the last 72 hours.  Invalid input(s): APPERANCEUR    Imaging: No results found.   Medications:    . scopolamine  1 patch Transdermal Q72H   acetaminophen  **OR** acetaminophen, ALPRAZolam, atropine, HYDROmorphone, metoprolol tartrate, ondansetron  Assessment/ Plan:  Danielle Rowland is a 78 y.o. white female with acute renal failure requiring hemodialysis, hepatic cirrhosis, diabetes mellitus type II, atrial fibrillation, hypertension, admitted on 01/03/2019 with altered mental status Discharged from [facility] on 12/19 to [facility] [place]  1. Acute Kidney Failure requiring outpatient hemodialysis: emergent hemodialysis treatment on admission.   2. Hypotension and sepsis weaned off vasopressors. Placed on steroids.   Cultures with no growth this admission.   3. Anemia with chronic kidney disease   4. Secondary Hyperparathyroidism with hypocalcemia: PTH 517.  Not currently on binders  Will discontinue dialysis on patient.     LOS: 4 Jamarrius Salay 12/24/202012:53 PM

## 2019-01-07 NOTE — Care Management (Signed)
Patient to discharge home today with  Hospice at home [facility] bed, O2, WC, and overbed table has already been ordered and will be delivered to the home today  RNCM confirmed with Marcene Brawn from hospice that family has arranged 24/7 care in the home  Confirmed with patient her wishes were to return home with   MD was notified that [facility]   EMS packet and signed DNR on chart.  Bedside RN notified

## 2019-01-07 NOTE — Progress Notes (Signed)
Called NP Ouma regarding patient request for pain medication. Appropriate orders were placed.  Will continue to monitor.  Christene Slates  01/07/2019 3:14 AM

## 2019-01-08 LAB — CULTURE, BLOOD (ROUTINE X 2)
Culture: NO GROWTH
Culture: NO GROWTH
Special Requests: ADEQUATE
Special Requests: ADEQUATE

## 2019-01-15 DEATH — deceased

## 2019-01-19 LAB — SULFONYLUREA HYPOGLYCEMICS PANEL, SERUM
Acetohexamide: NEGATIVE ug/mL (ref 20–60)
Chlorpropamide: NEGATIVE ug/mL (ref 75–250)
Glimepiride: NEGATIVE ng/mL (ref 80–250)
Glipizide: NEGATIVE ng/mL (ref 200–1000)
Glyburide: NEGATIVE ng/mL
Nateglinide: NEGATIVE ng/mL
Repaglinide: NEGATIVE ng/mL
Tolazamide: NEGATIVE ug/mL
Tolbutamide: NEGATIVE ug/mL (ref 40–100)

## 2019-02-24 ENCOUNTER — Inpatient Hospital Stay: Payer: Medicare Other

## 2019-03-09 ENCOUNTER — Other Ambulatory Visit: Payer: Self-pay | Admitting: Family Medicine

## 2019-03-09 DIAGNOSIS — I10 Essential (primary) hypertension: Secondary | ICD-10-CM

## 2019-04-21 ENCOUNTER — Inpatient Hospital Stay: Payer: Medicare Other

## 2019-07-14 ENCOUNTER — Ambulatory Visit: Payer: Medicare Other | Admitting: Internal Medicine

## 2019-07-14 ENCOUNTER — Other Ambulatory Visit: Payer: Medicare Other

## 2020-11-27 IMAGING — CT CT RENAL STONE PROTOCOL
2 of 4 series · 16 of 46 positions shown, 18 images · non-contrast
Comparison: CT the abdomen and pelvis 03/23/2013.

CLINICAL DATA: 78-year-old female with history of flank pain.
Suspected nephrolithiasis.

EXAM:
CT ABDOMEN AND PELVIS WITHOUT CONTRAST
TECHNIQUE: Multidetector CT imaging of the abdomen and pelvis was performed
following the standard protocol without IV contrast.

[Series 2: stone full standard · axial · 0.79mm/px · z∈[-830,-430]mm · 13 of 88 slices shown, 15 images]
[im 4/88  soft-tissue]
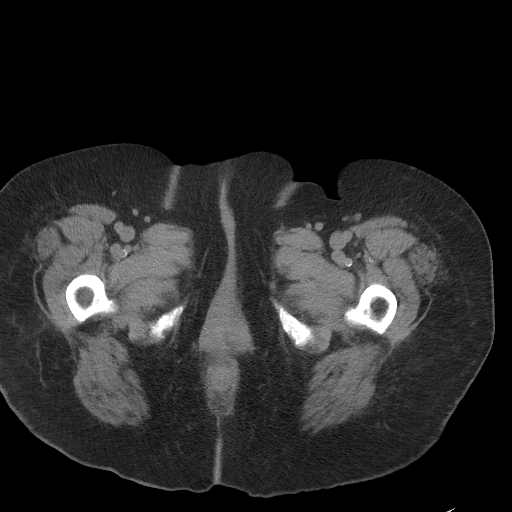
[im 4/88  bone]
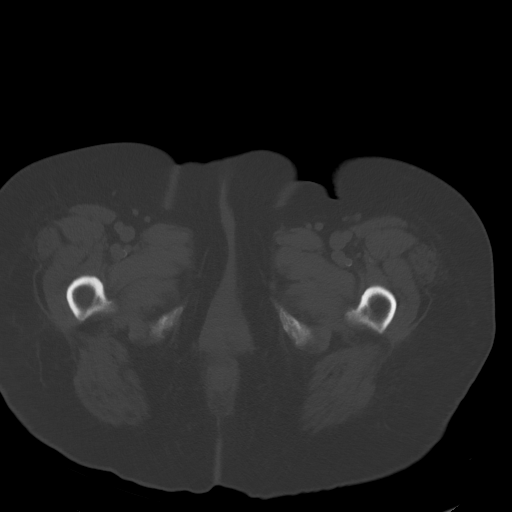
[im 11/88  soft-tissue]
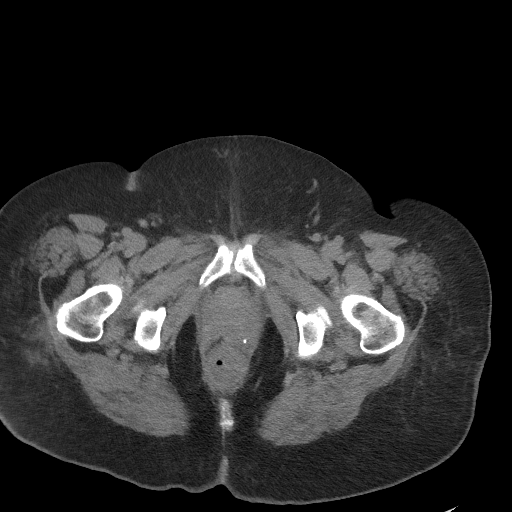
[im 19/88  soft-tissue]
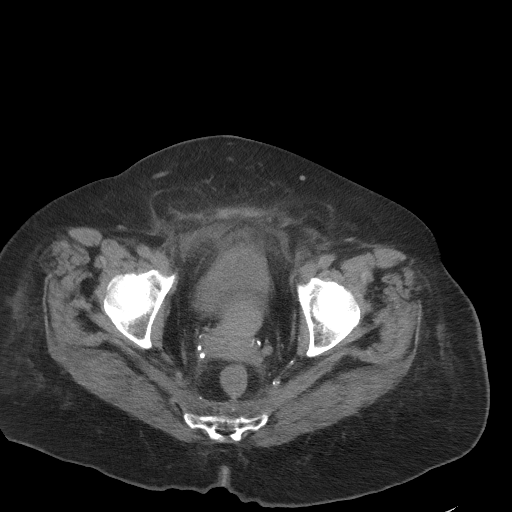
[im 26/88  soft-tissue]
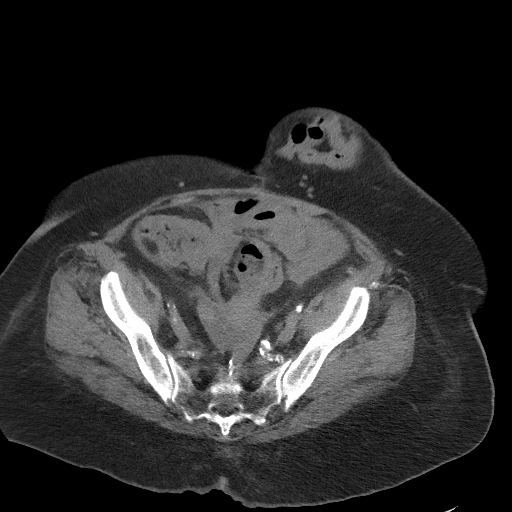
[im 30/88  soft-tissue]
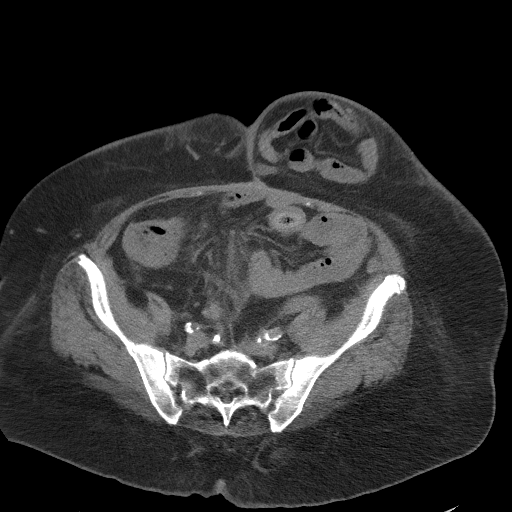
[im 37/88  soft-tissue]
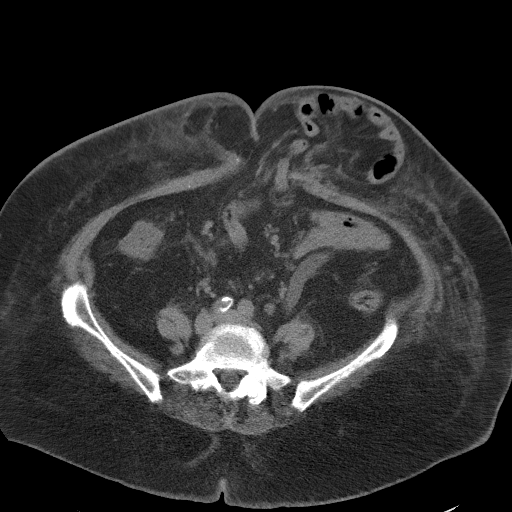
[im 44/88  soft-tissue]
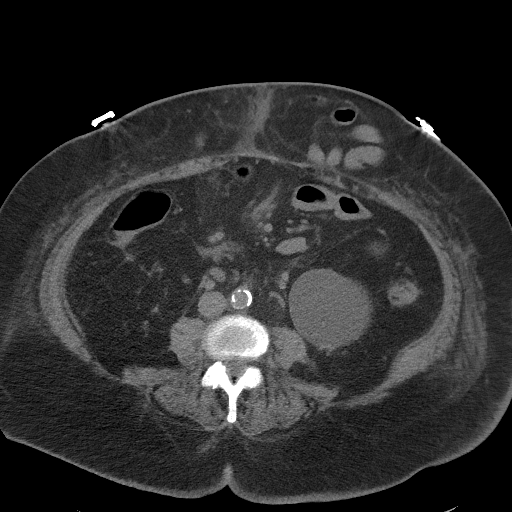
[im 51/88  soft-tissue]
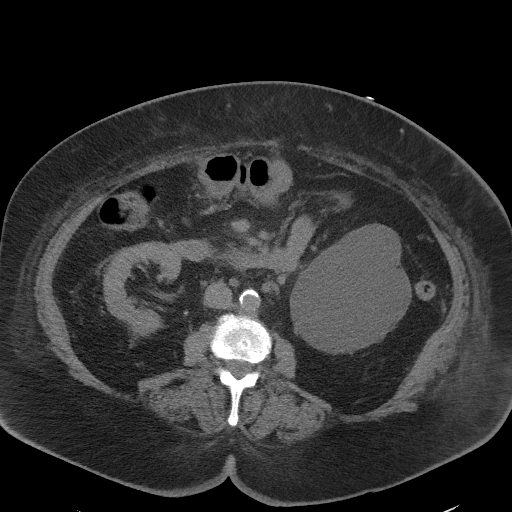
[im 59/88  soft-tissue]
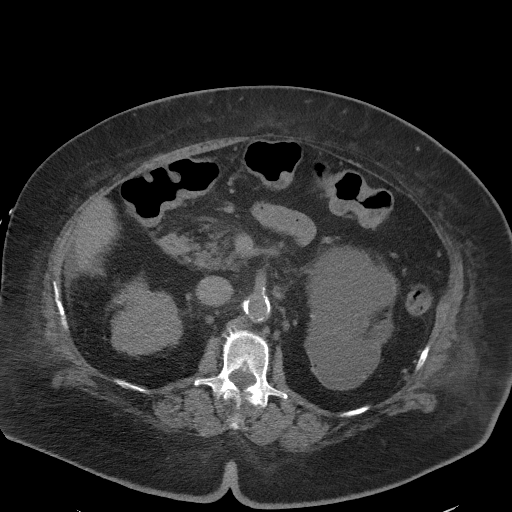
[im 59/88  bone]
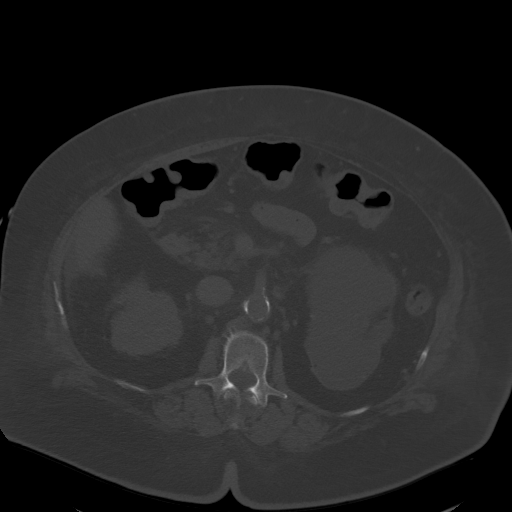
[im 62/88  soft-tissue]
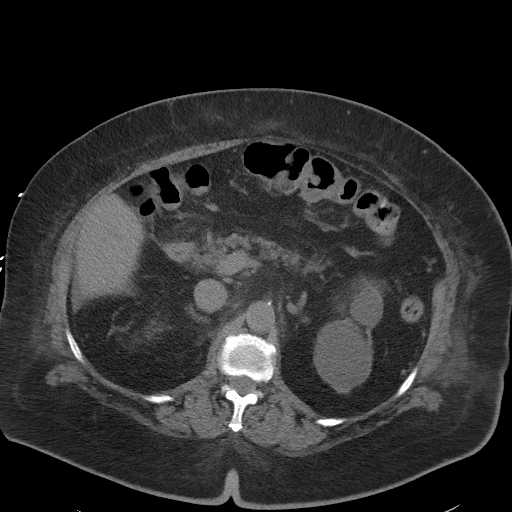
[im 69/88  soft-tissue]
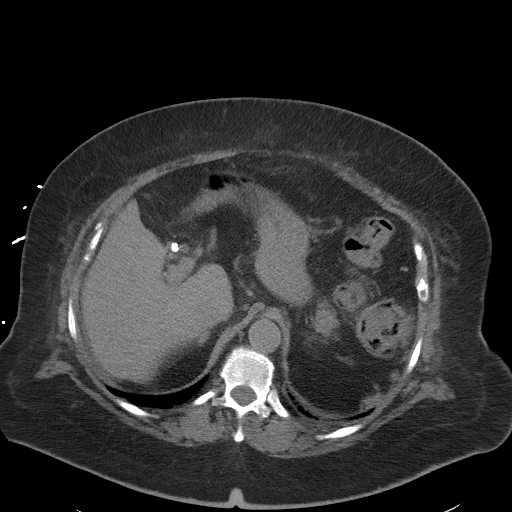
[im 77/88  soft-tissue]
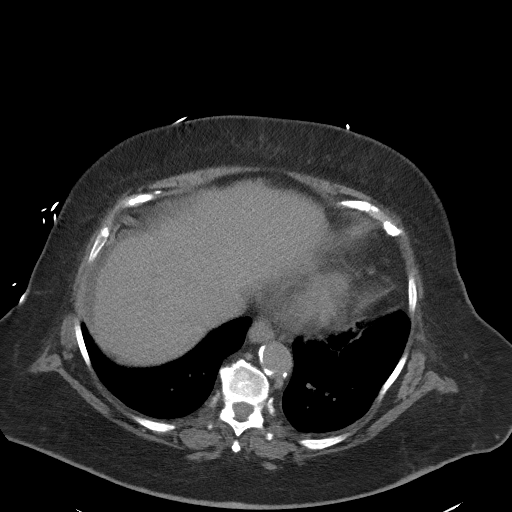
[im 84/88  soft-tissue]
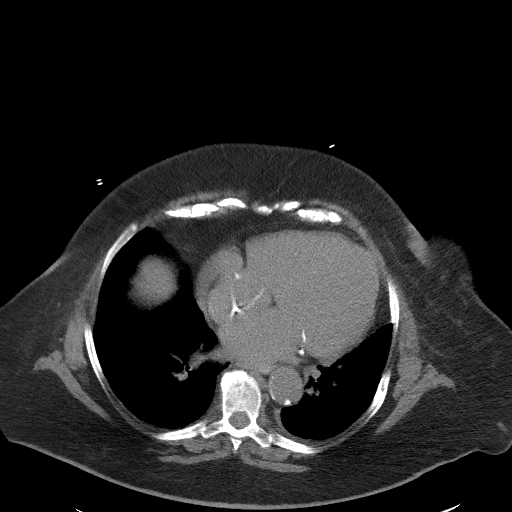

[Series 5: coronal · coronal · 0.90mm/px · 3 of 172 slices shown]
[im 58/172  soft-tissue]
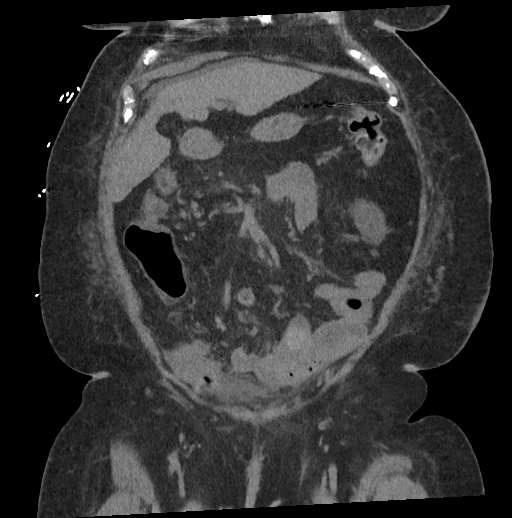
[im 77/172  soft-tissue]
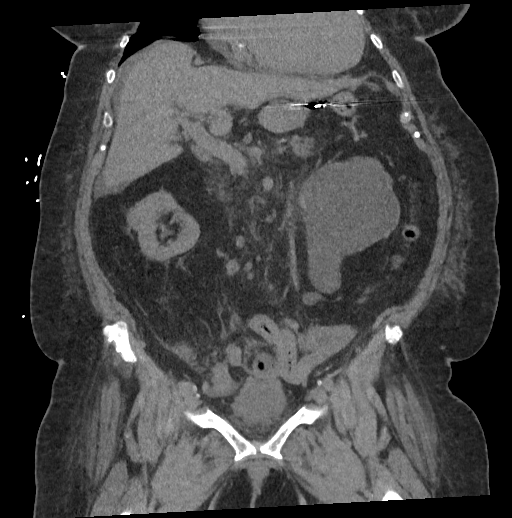
[im 96/172  soft-tissue]
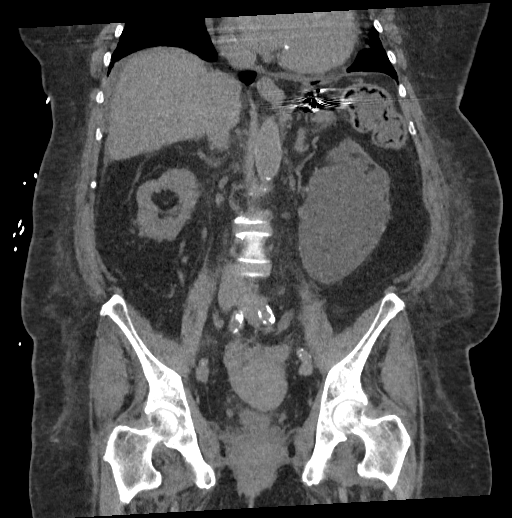

[16 of 46 positions shown; findings below may reference images not displayed]

FINDINGS: Lower chest: Cardiomegaly. Calcifications of the inferior mitral
annulus. Atherosclerotic calcifications in the descending thoracic
aorta as well as the left circumflex and right coronary arteries.
Central venous catheter tip in the right atrium.

Hepatobiliary: Liver has a shrunken appearance and nodular contour,
indicative of underlying cirrhosis. No discrete cystic or solid
hepatic lesions are confidently identified on today's noncontrast CT
examination. Status post cholecystectomy.

Pancreas: No definite pancreatic mass or peripancreatic fluid
collections or inflammatory changes noted on today's noncontrast CT
examination.

Spleen: Status post splenectomy.

Adrenals/Urinary Tract: Severe chronic left-sided
hydroureteronephrosis, similar to the prior study. On today's
examination (axial image 57 of series 2) there is a 6 mm calculus at
the junction of middle and distal thirds of the left ureter. Severe
atrophy of the left renal parenchyma. Unenhanced appearance of the
right kidney is unremarkable. No right hydroureteronephrosis.
Urinary bladder is nearly decompressed, but otherwise unremarkable
in appearance.

Stomach/Bowel: Normal appearance of the stomach. No pathologic
dilatation of small bowel or colon. Suture line at the rectosigmoid
junction from prior partial colorectal resection. Large ventral
hernia containing multiple loops of small bowel, similar to the
prior examination. Appendix is not confidently identified. There are
areas of mural thickening in the cecum and ascending colon (axial
image 65 and axial image 53 of series 2 respectively), which could
reflect regions of colitis.

Vascular/Lymphatic: Aortic atherosclerosis. No lymphadenopathy is
noted in the abdomen or pelvis.

Reproductive: Uterus and ovaries are a trophic.

Other: No significant volume of ascites.  No pneumoperitoneum.

Musculoskeletal: There are no aggressive appearing lytic or blastic
lesions noted in the visualized portions of the skeleton.
IMPRESSION: 1. 6 mm calculus at the junction of middle and distal thirds of the
left ureter is new compared to the prior study, with severe proximal
chronic left-sided hydroureteronephrosis and severe atrophy of the
left kidney which otherwise appears similar to the prior
examination.
2. Focal areas of mural thickening involving the cecum and ascending
colon, concerning for potential regions of colitis.
3. Cirrhosis.
4. Status post cholecystectomy.
5. Aortic atherosclerosis, in addition to least 2 vessel coronary
artery disease. Assessment for potential risk factor modification,
dietary therapy or pharmacologic therapy may be warranted, if
clinically indicated.
6. Additional incidental findings, as above.

## 2020-12-04 IMAGING — US US EXTREM  UP VENOUS*L*
1 series · 13 of 24 positions shown · non-contrast
Comparison: None.

CLINICAL DATA: History of left-sided dialysis catheter insertion on
12/28/2018 now with pain and edema. Evaluate for DVT.



[Series 1: us extrem up venous*left* · 13 of 31 slices shown]
[im 1/31]
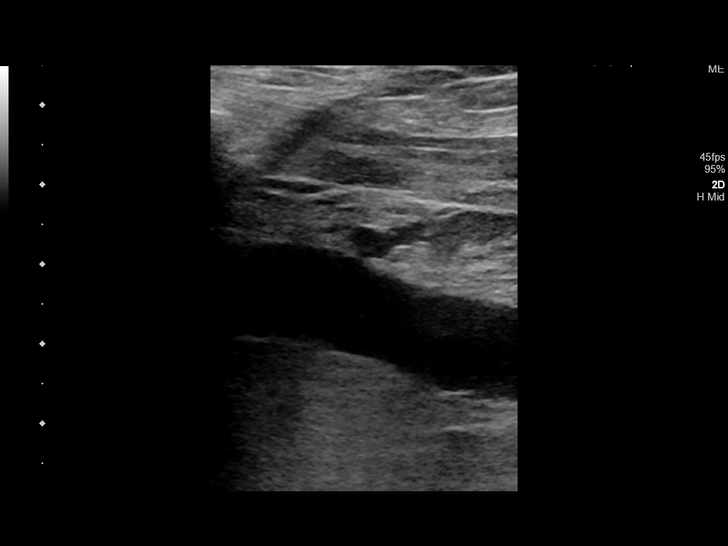
[im 3/31]
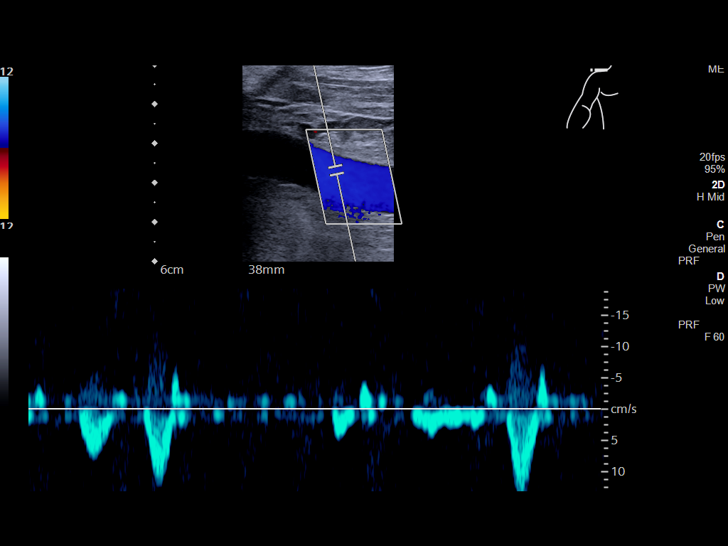
[im 6/31]
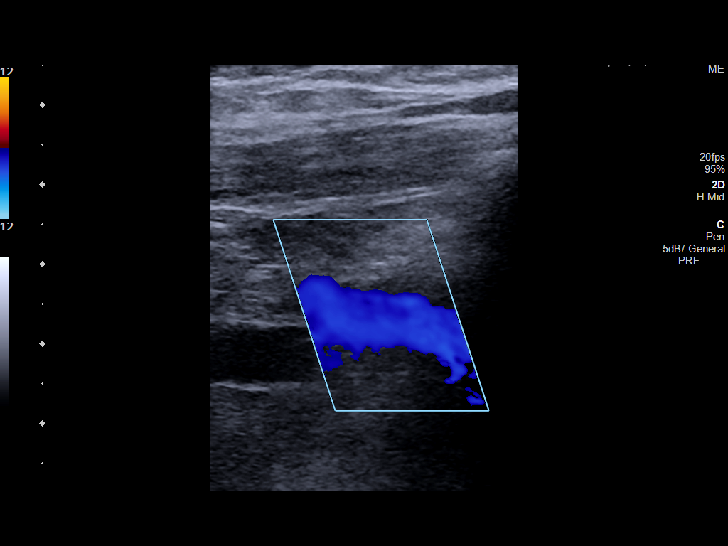
[im 8/31]
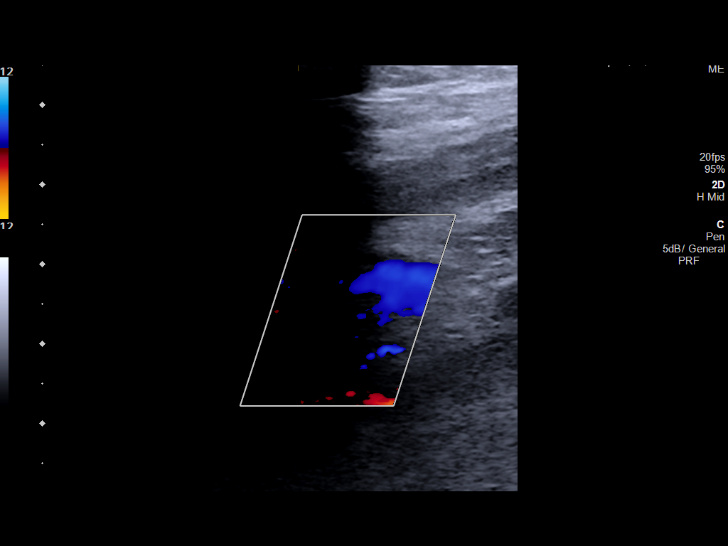
[im 11/31]
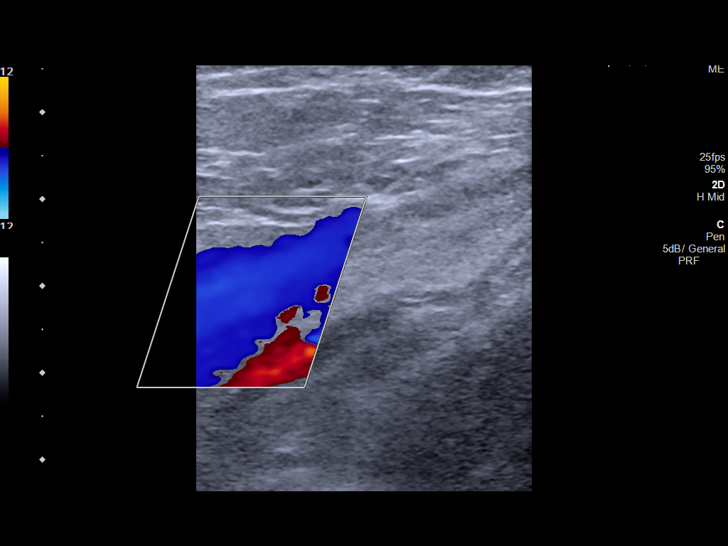
[im 14/31]
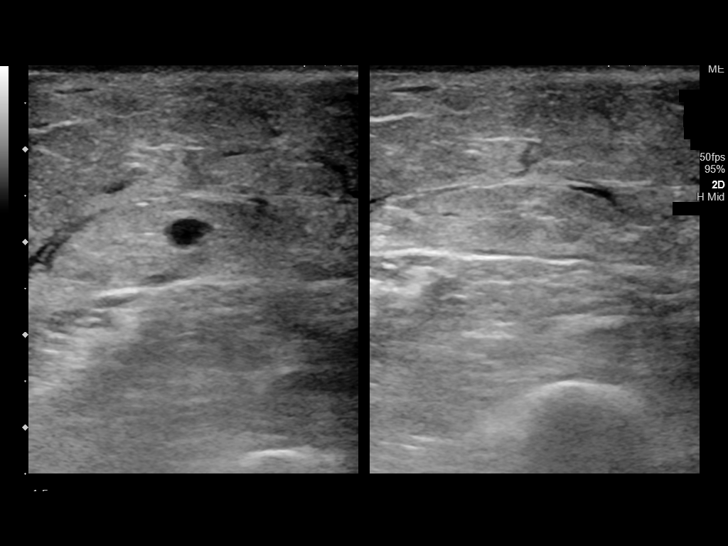
[im 16/31]
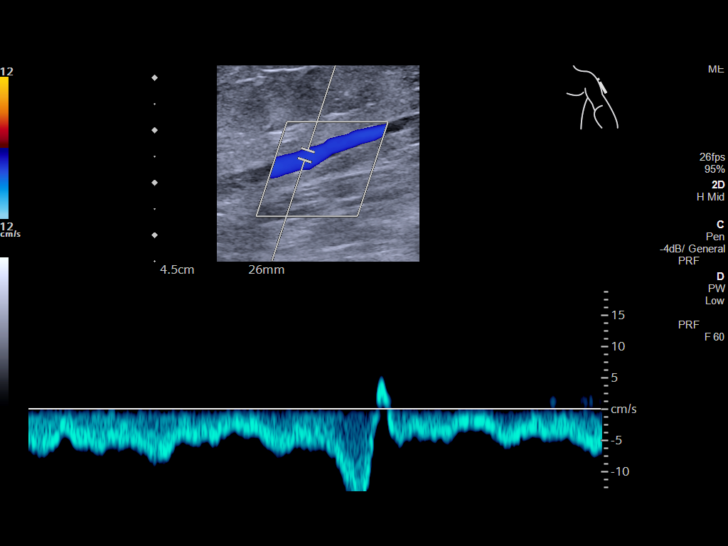
[im 17/31]
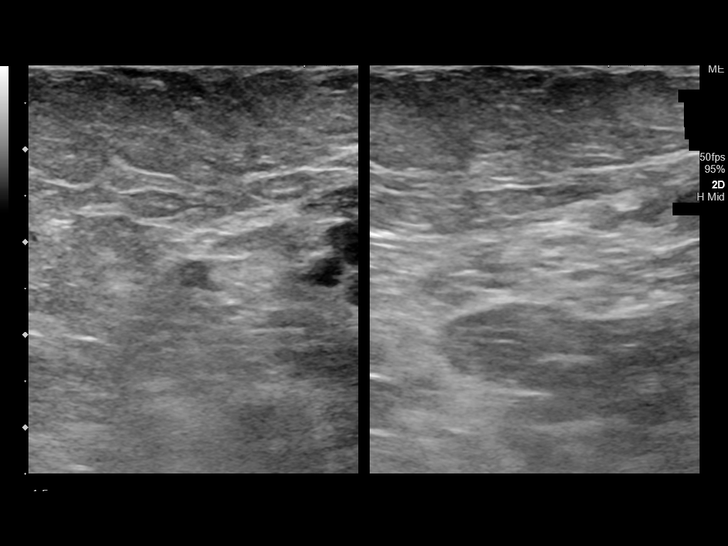
[im 20/31]
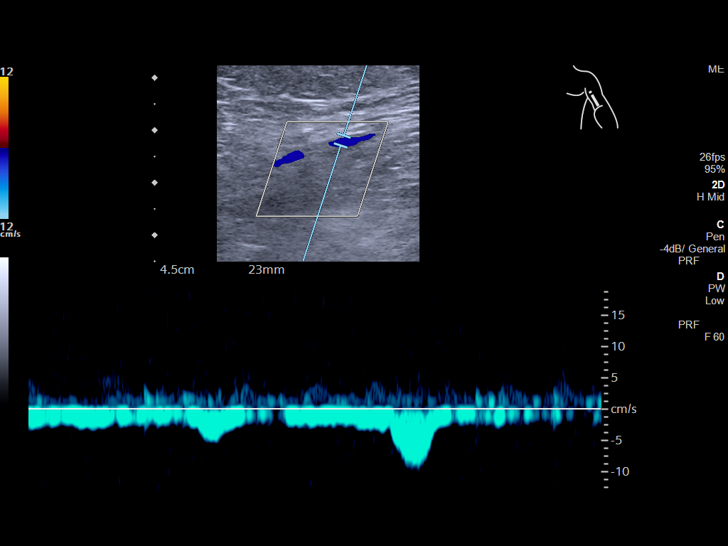
[im 23/31]
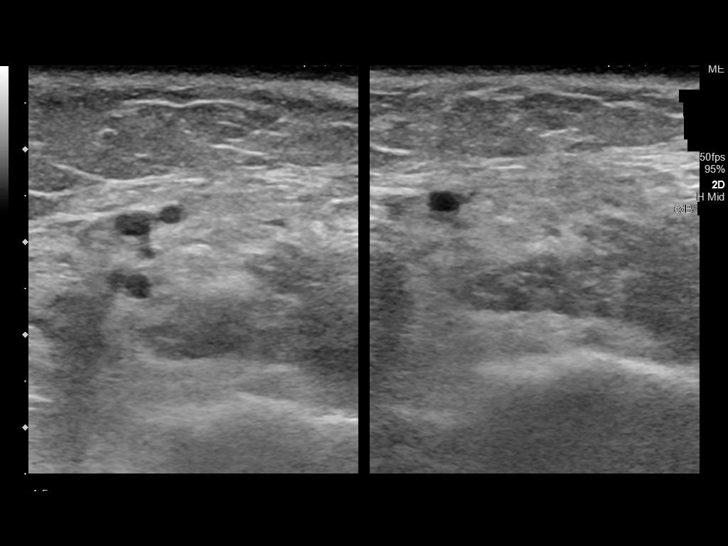
[im 25/31]
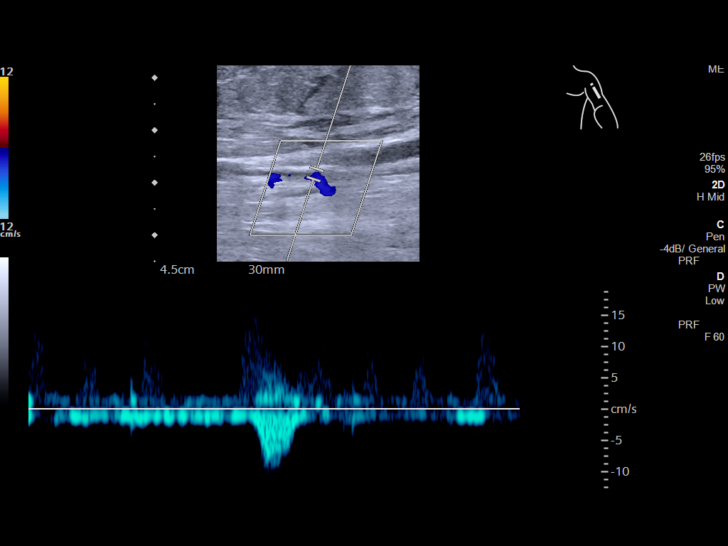
[im 28/31]
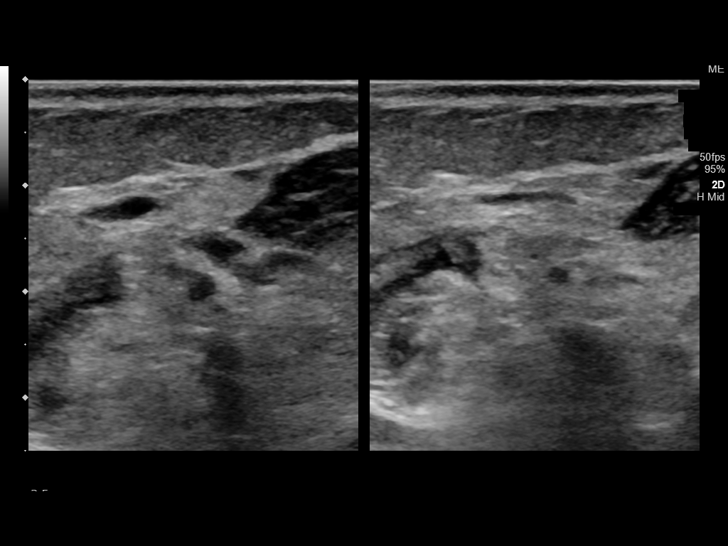
[im 31/31]
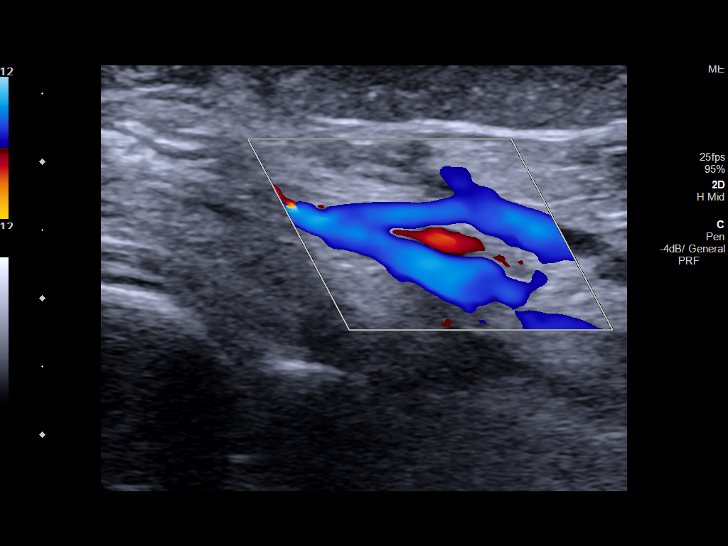

[13 of 24 positions shown; findings below may reference images not displayed]

FINDINGS: Contralateral Subclavian Vein: Respiratory phasicity is normal and
symmetric with the symptomatic side. No evidence of thrombus. Normal
compressibility.

Internal Jugular Vein: No evidence of thrombus. Normal
compressibility, respiratory phasicity and response to augmentation.

Subclavian Vein: No evidence of thrombus. Normal compressibility,
respiratory phasicity and response to augmentation.

Axillary Vein: No evidence of thrombus. Normal compressibility,
respiratory phasicity and response to augmentation.

Cephalic Vein: No evidence of thrombus. Normal compressibility,
respiratory phasicity and response to augmentation.

Basilic Vein: No evidence of thrombus. Normal compressibility,
respiratory phasicity and response to augmentation.

Brachial Veins: No evidence of thrombus within either of the paired
brachial veins. Normal compressibility, respiratory phasicity and
response to augmentation.

Radial Veins: No evidence of thrombus. Normal compressibility,
respiratory phasicity and response to augmentation.

Ulnar Veins: No evidence of thrombus. Normal compressibility,
respiratory phasicity and response to augmentation.

Venous Reflux:  None visualized.

Other Findings:  None visualized.
IMPRESSION: No evidence of DVT within the left upper extremity.
